# Patient Record
Sex: Female | Born: 1940
Health system: Southern US, Community
[De-identification: ages and names within clinical notes are randomized; demographics above are authoritative.]

## PROBLEM LIST (undated history)

## (undated) DIAGNOSIS — Z9889 Other specified postprocedural states: Secondary | ICD-10-CM

## (undated) DIAGNOSIS — R112 Nausea with vomiting, unspecified: Secondary | ICD-10-CM

## (undated) DIAGNOSIS — T8859XA Other complications of anesthesia, initial encounter: Secondary | ICD-10-CM

## (undated) DIAGNOSIS — C439 Malignant melanoma of skin, unspecified: Secondary | ICD-10-CM

## (undated) DIAGNOSIS — M8580 Other specified disorders of bone density and structure, unspecified site: Secondary | ICD-10-CM

## (undated) DIAGNOSIS — C689 Malignant neoplasm of urinary organ, unspecified: Secondary | ICD-10-CM

## (undated) DIAGNOSIS — T4145XA Adverse effect of unspecified anesthetic, initial encounter: Secondary | ICD-10-CM

## (undated) DIAGNOSIS — M1990 Unspecified osteoarthritis, unspecified site: Secondary | ICD-10-CM

## (undated) DIAGNOSIS — I10 Essential (primary) hypertension: Secondary | ICD-10-CM

## (undated) DIAGNOSIS — R000 Tachycardia, unspecified: Secondary | ICD-10-CM

## (undated) DIAGNOSIS — Z22322 Carrier or suspected carrier of Methicillin resistant Staphylococcus aureus: Secondary | ICD-10-CM

## (undated) HISTORY — DX: Tachycardia, unspecified: R00.0

## (undated) HISTORY — DX: Essential (primary) hypertension: I10

## (undated) HISTORY — DX: Malignant neoplasm of urinary organ, unspecified: C68.9

## (undated) HISTORY — PX: OTHER SURGICAL HISTORY: SHX169

## (undated) HISTORY — DX: Carrier or suspected carrier of methicillin resistant Staphylococcus aureus: Z22.322

## (undated) HISTORY — PX: TONSILLECTOMY: SHX5217

## (undated) HISTORY — DX: Other specified disorders of bone density and structure, unspecified site: M85.80

## (undated) HISTORY — PX: BACK SURGERY: SHX140

## (undated) HISTORY — DX: Unspecified osteoarthritis, unspecified site: M19.90

## (undated) HISTORY — PX: EYE SURGERY: SHX253

## (undated) HISTORY — DX: Malignant melanoma of skin, unspecified: C43.9

---
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
## 1975-11-17 HISTORY — PX: MELANOMA EXCISION: SHX5266

## 1985-11-16 HISTORY — PX: ABDOMINAL HYSTERECTOMY: SHX81

## 1997-11-16 HISTORY — PX: SKIN CANCER EXCISION: SHX779

## 1998-04-23 ENCOUNTER — Ambulatory Visit (HOSPITAL_COMMUNITY): Admission: RE | Admit: 1998-04-23 | Discharge: 1998-04-23 | Payer: Self-pay | Admitting: *Deleted

## 1999-06-03 ENCOUNTER — Other Ambulatory Visit: Admission: RE | Admit: 1999-06-03 | Discharge: 1999-06-03 | Payer: Self-pay | Admitting: *Deleted

## 2000-08-16 ENCOUNTER — Other Ambulatory Visit: Admission: RE | Admit: 2000-08-16 | Discharge: 2000-08-16 | Payer: Self-pay | Admitting: *Deleted

## 2001-08-04 ENCOUNTER — Other Ambulatory Visit: Admission: RE | Admit: 2001-08-04 | Discharge: 2001-08-04 | Payer: Self-pay | Admitting: *Deleted

## 2001-08-29 ENCOUNTER — Encounter: Payer: Self-pay | Admitting: *Deleted

## 2001-08-29 ENCOUNTER — Encounter: Admission: RE | Admit: 2001-08-29 | Discharge: 2001-08-29 | Payer: Self-pay | Admitting: *Deleted

## 2002-09-08 ENCOUNTER — Encounter: Admission: RE | Admit: 2002-09-08 | Discharge: 2002-09-08 | Payer: Self-pay | Admitting: Obstetrics and Gynecology

## 2002-09-08 ENCOUNTER — Encounter: Payer: Self-pay | Admitting: Obstetrics and Gynecology

## 2002-11-16 HISTORY — PX: CATARACT EXTRACTION: SUR2

## 2003-09-11 ENCOUNTER — Other Ambulatory Visit: Admission: RE | Admit: 2003-09-11 | Discharge: 2003-09-11 | Payer: Self-pay | Admitting: Family Medicine

## 2004-02-07 ENCOUNTER — Encounter: Admission: RE | Admit: 2004-02-07 | Discharge: 2004-02-07 | Payer: Self-pay | Admitting: Family Medicine

## 2004-07-25 ENCOUNTER — Encounter: Admission: RE | Admit: 2004-07-25 | Discharge: 2004-07-25 | Payer: Self-pay | Admitting: Family Medicine

## 2004-10-03 ENCOUNTER — Other Ambulatory Visit: Admission: RE | Admit: 2004-10-03 | Discharge: 2004-10-03 | Payer: Self-pay | Admitting: Family Medicine

## 2004-10-03 ENCOUNTER — Ambulatory Visit: Payer: Self-pay | Admitting: Family Medicine

## 2004-12-02 ENCOUNTER — Ambulatory Visit: Payer: Self-pay | Admitting: Internal Medicine

## 2005-05-21 ENCOUNTER — Ambulatory Visit: Payer: Self-pay | Admitting: Family Medicine

## 2005-07-23 ENCOUNTER — Encounter: Admission: RE | Admit: 2005-07-23 | Discharge: 2005-07-23 | Payer: Self-pay | Admitting: Family Medicine

## 2005-09-02 ENCOUNTER — Other Ambulatory Visit: Admission: RE | Admit: 2005-09-02 | Discharge: 2005-09-02 | Payer: Self-pay | Admitting: Family Medicine

## 2005-09-02 ENCOUNTER — Ambulatory Visit: Payer: Self-pay | Admitting: Family Medicine

## 2005-10-23 ENCOUNTER — Ambulatory Visit: Payer: Self-pay | Admitting: Family Medicine

## 2006-10-12 ENCOUNTER — Ambulatory Visit: Payer: Self-pay | Admitting: Family Medicine

## 2006-11-18 ENCOUNTER — Other Ambulatory Visit: Admission: RE | Admit: 2006-11-18 | Discharge: 2006-11-18 | Payer: Self-pay | Admitting: Family Medicine

## 2006-11-18 ENCOUNTER — Ambulatory Visit: Payer: Self-pay | Admitting: Family Medicine

## 2006-11-18 ENCOUNTER — Encounter: Payer: Self-pay | Admitting: Family Medicine

## 2006-11-18 LAB — CONVERTED CEMR LAB
AST: 20 units/L (ref 0–37)
Albumin: 3.5 g/dL (ref 3.5–5.2)
BUN: 22 mg/dL (ref 6–23)
CO2: 30 meq/L (ref 19–32)
Chol/HDL Ratio, serum: 4.1
Cholesterol: 178 mg/dL (ref 0–200)
Creatinine, Ser: 1.2 mg/dL (ref 0.4–1.2)
Glomerular Filtration Rate, Af Am: 58 mL/min/{1.73_m2}
HCT: 43.8 % (ref 36.0–46.0)
HDL: 43.5 mg/dL (ref >39.0)
Hemoglobin: 14.7 g/dL (ref 12.0–15.0)
Monocytes Absolute: 0.4 10*3/uL (ref 0.2–0.7)
Neutrophils Relative %: 69.1 % (ref 43.0–77.0)
Platelets: 226 10*3/uL (ref 150–400)
Potassium: 4.4 meq/L (ref 3.5–5.1)
RBC: 4.74 M/uL (ref 3.87–5.11)
TSH: 0.71 microintl units/mL (ref 0.35–5.50)
Total Bilirubin: 1.1 mg/dL (ref 0.3–1.2)
WBC: 6.8 10*3/uL (ref 4.5–10.5)

## 2006-11-20 ENCOUNTER — Encounter: Payer: Self-pay | Admitting: Family Medicine

## 2006-11-20 LAB — CONVERTED CEMR LAB

## 2006-12-27 ENCOUNTER — Ambulatory Visit: Payer: Self-pay | Admitting: Family Medicine

## 2007-04-08 ENCOUNTER — Encounter: Admission: RE | Admit: 2007-04-08 | Discharge: 2007-04-08 | Payer: Self-pay | Admitting: Family Medicine

## 2007-05-19 ENCOUNTER — Ambulatory Visit: Payer: Self-pay | Admitting: Internal Medicine

## 2007-05-19 ENCOUNTER — Ambulatory Visit: Payer: Self-pay | Admitting: Family Medicine

## 2007-05-19 DIAGNOSIS — J019 Acute sinusitis, unspecified: Secondary | ICD-10-CM | POA: Insufficient documentation

## 2007-05-19 DIAGNOSIS — L03019 Cellulitis of unspecified finger: Secondary | ICD-10-CM | POA: Insufficient documentation

## 2007-06-02 ENCOUNTER — Ambulatory Visit: Payer: Self-pay | Admitting: Internal Medicine

## 2007-06-02 ENCOUNTER — Encounter: Payer: Self-pay | Admitting: Family Medicine

## 2007-06-10 ENCOUNTER — Encounter: Payer: Self-pay | Admitting: Family Medicine

## 2007-07-19 ENCOUNTER — Telehealth (INDEPENDENT_AMBULATORY_CARE_PROVIDER_SITE_OTHER): Payer: Self-pay | Admitting: *Deleted

## 2007-07-20 ENCOUNTER — Ambulatory Visit: Payer: Self-pay | Admitting: Family Medicine

## 2007-07-20 DIAGNOSIS — L0390 Cellulitis, unspecified: Secondary | ICD-10-CM

## 2007-07-20 DIAGNOSIS — L0291 Cutaneous abscess, unspecified: Secondary | ICD-10-CM | POA: Insufficient documentation

## 2007-07-28 ENCOUNTER — Ambulatory Visit: Payer: Self-pay | Admitting: Family Medicine

## 2007-07-28 DIAGNOSIS — L258 Unspecified contact dermatitis due to other agents: Secondary | ICD-10-CM | POA: Insufficient documentation

## 2007-08-04 ENCOUNTER — Ambulatory Visit: Payer: Self-pay | Admitting: Family Medicine

## 2007-09-23 ENCOUNTER — Ambulatory Visit: Payer: Self-pay | Admitting: Family Medicine

## 2007-09-23 DIAGNOSIS — J069 Acute upper respiratory infection, unspecified: Secondary | ICD-10-CM | POA: Insufficient documentation

## 2007-09-24 ENCOUNTER — Encounter: Payer: Self-pay | Admitting: Family Medicine

## 2007-09-26 ENCOUNTER — Telehealth (INDEPENDENT_AMBULATORY_CARE_PROVIDER_SITE_OTHER): Payer: Self-pay | Admitting: *Deleted

## 2007-10-06 ENCOUNTER — Ambulatory Visit: Payer: Self-pay | Admitting: Family Medicine

## 2007-10-06 DIAGNOSIS — B957 Other staphylococcus as the cause of diseases classified elsewhere: Secondary | ICD-10-CM | POA: Insufficient documentation

## 2007-10-31 ENCOUNTER — Telehealth (INDEPENDENT_AMBULATORY_CARE_PROVIDER_SITE_OTHER): Payer: Self-pay | Admitting: *Deleted

## 2007-11-21 ENCOUNTER — Telehealth (INDEPENDENT_AMBULATORY_CARE_PROVIDER_SITE_OTHER): Payer: Self-pay | Admitting: *Deleted

## 2007-12-12 ENCOUNTER — Telehealth (INDEPENDENT_AMBULATORY_CARE_PROVIDER_SITE_OTHER): Payer: Self-pay | Admitting: *Deleted

## 2007-12-19 ENCOUNTER — Telehealth (INDEPENDENT_AMBULATORY_CARE_PROVIDER_SITE_OTHER): Payer: Self-pay | Admitting: *Deleted

## 2007-12-23 ENCOUNTER — Telehealth (INDEPENDENT_AMBULATORY_CARE_PROVIDER_SITE_OTHER): Payer: Self-pay | Admitting: *Deleted

## 2008-01-09 ENCOUNTER — Telehealth (INDEPENDENT_AMBULATORY_CARE_PROVIDER_SITE_OTHER): Payer: Self-pay | Admitting: *Deleted

## 2008-03-02 ENCOUNTER — Encounter: Payer: Self-pay | Admitting: Family Medicine

## 2008-03-02 ENCOUNTER — Ambulatory Visit: Payer: Self-pay | Admitting: Family Medicine

## 2008-03-02 ENCOUNTER — Other Ambulatory Visit: Admission: RE | Admit: 2008-03-02 | Discharge: 2008-03-02 | Payer: Self-pay | Admitting: Family Medicine

## 2008-03-02 DIAGNOSIS — I10 Essential (primary) hypertension: Secondary | ICD-10-CM | POA: Insufficient documentation

## 2008-03-02 DIAGNOSIS — C679 Malignant neoplasm of bladder, unspecified: Secondary | ICD-10-CM | POA: Insufficient documentation

## 2008-03-02 DIAGNOSIS — E039 Hypothyroidism, unspecified: Secondary | ICD-10-CM

## 2008-03-02 DIAGNOSIS — N951 Menopausal and female climacteric states: Secondary | ICD-10-CM | POA: Insufficient documentation

## 2008-03-02 DIAGNOSIS — Z85820 Personal history of malignant melanoma of skin: Secondary | ICD-10-CM | POA: Insufficient documentation

## 2008-03-02 DIAGNOSIS — H9190 Unspecified hearing loss, unspecified ear: Secondary | ICD-10-CM | POA: Insufficient documentation

## 2008-03-02 HISTORY — DX: Hypothyroidism, unspecified: E03.9

## 2008-03-02 HISTORY — DX: Unspecified hearing loss, unspecified ear: H91.90

## 2008-03-05 ENCOUNTER — Encounter (INDEPENDENT_AMBULATORY_CARE_PROVIDER_SITE_OTHER): Payer: Self-pay | Admitting: *Deleted

## 2008-03-06 ENCOUNTER — Encounter (INDEPENDENT_AMBULATORY_CARE_PROVIDER_SITE_OTHER): Payer: Self-pay | Admitting: *Deleted

## 2008-03-07 ENCOUNTER — Encounter (INDEPENDENT_AMBULATORY_CARE_PROVIDER_SITE_OTHER): Payer: Self-pay | Admitting: *Deleted

## 2008-03-27 ENCOUNTER — Encounter: Payer: Self-pay | Admitting: Family Medicine

## 2008-05-24 ENCOUNTER — Telehealth (INDEPENDENT_AMBULATORY_CARE_PROVIDER_SITE_OTHER): Payer: Self-pay | Admitting: *Deleted

## 2008-06-20 ENCOUNTER — Ambulatory Visit: Payer: Self-pay | Admitting: Family Medicine

## 2008-06-20 DIAGNOSIS — F438 Other reactions to severe stress: Secondary | ICD-10-CM

## 2008-06-20 DIAGNOSIS — F4389 Other reactions to severe stress: Secondary | ICD-10-CM | POA: Insufficient documentation

## 2008-06-28 ENCOUNTER — Encounter: Payer: Self-pay | Admitting: Family Medicine

## 2008-09-12 ENCOUNTER — Ambulatory Visit: Payer: Self-pay | Admitting: Family Medicine

## 2008-10-04 ENCOUNTER — Encounter: Admission: RE | Admit: 2008-10-04 | Discharge: 2008-10-04 | Payer: Self-pay | Admitting: Family Medicine

## 2008-10-07 ENCOUNTER — Encounter (INDEPENDENT_AMBULATORY_CARE_PROVIDER_SITE_OTHER): Payer: Self-pay | Admitting: *Deleted

## 2008-10-23 ENCOUNTER — Telehealth: Payer: Self-pay | Admitting: Family Medicine

## 2008-11-07 ENCOUNTER — Ambulatory Visit: Payer: Self-pay | Admitting: Internal Medicine

## 2008-11-27 ENCOUNTER — Telehealth (INDEPENDENT_AMBULATORY_CARE_PROVIDER_SITE_OTHER): Payer: Self-pay | Admitting: *Deleted

## 2009-02-18 ENCOUNTER — Telehealth (INDEPENDENT_AMBULATORY_CARE_PROVIDER_SITE_OTHER): Payer: Self-pay | Admitting: *Deleted

## 2009-03-12 ENCOUNTER — Telehealth (INDEPENDENT_AMBULATORY_CARE_PROVIDER_SITE_OTHER): Payer: Self-pay | Admitting: *Deleted

## 2009-03-12 ENCOUNTER — Ambulatory Visit: Payer: Self-pay | Admitting: Family Medicine

## 2009-03-12 ENCOUNTER — Other Ambulatory Visit: Admission: RE | Admit: 2009-03-12 | Discharge: 2009-03-12 | Payer: Self-pay | Admitting: Family Medicine

## 2009-03-12 ENCOUNTER — Encounter: Payer: Self-pay | Admitting: Family Medicine

## 2009-03-12 ENCOUNTER — Encounter (INDEPENDENT_AMBULATORY_CARE_PROVIDER_SITE_OTHER): Payer: Self-pay | Admitting: *Deleted

## 2009-03-13 ENCOUNTER — Encounter: Payer: Self-pay | Admitting: Family Medicine

## 2009-03-18 ENCOUNTER — Encounter (INDEPENDENT_AMBULATORY_CARE_PROVIDER_SITE_OTHER): Payer: Self-pay | Admitting: *Deleted

## 2009-03-27 ENCOUNTER — Telehealth: Payer: Self-pay | Admitting: Family Medicine

## 2009-05-07 ENCOUNTER — Telehealth (INDEPENDENT_AMBULATORY_CARE_PROVIDER_SITE_OTHER): Payer: Self-pay | Admitting: *Deleted

## 2009-06-06 ENCOUNTER — Ambulatory Visit (HOSPITAL_COMMUNITY): Admission: RE | Admit: 2009-06-06 | Discharge: 2009-06-06 | Payer: Self-pay | Admitting: Plastic Surgery

## 2009-06-24 ENCOUNTER — Encounter (INDEPENDENT_AMBULATORY_CARE_PROVIDER_SITE_OTHER): Payer: Self-pay | Admitting: Plastic Surgery

## 2009-06-24 ENCOUNTER — Ambulatory Visit (HOSPITAL_BASED_OUTPATIENT_CLINIC_OR_DEPARTMENT_OTHER): Admission: RE | Admit: 2009-06-24 | Discharge: 2009-06-24 | Payer: Self-pay | Admitting: Plastic Surgery

## 2009-06-24 HISTORY — PX: MELANOMA EXCISION: SHX5266

## 2009-09-05 ENCOUNTER — Telehealth (INDEPENDENT_AMBULATORY_CARE_PROVIDER_SITE_OTHER): Payer: Self-pay | Admitting: *Deleted

## 2009-10-01 ENCOUNTER — Ambulatory Visit: Payer: Self-pay | Admitting: Family Medicine

## 2009-11-04 ENCOUNTER — Telehealth: Payer: Self-pay | Admitting: Family Medicine

## 2010-02-11 ENCOUNTER — Encounter: Admission: RE | Admit: 2010-02-11 | Discharge: 2010-02-11 | Payer: Self-pay | Admitting: Family Medicine

## 2010-02-11 LAB — HM MAMMOGRAPHY

## 2010-03-13 ENCOUNTER — Ambulatory Visit: Payer: Self-pay | Admitting: Family Medicine

## 2010-03-13 ENCOUNTER — Encounter (INDEPENDENT_AMBULATORY_CARE_PROVIDER_SITE_OTHER): Payer: Self-pay | Admitting: *Deleted

## 2010-03-13 DIAGNOSIS — C4370 Malignant melanoma of unspecified lower limb, including hip: Secondary | ICD-10-CM | POA: Insufficient documentation

## 2010-04-01 ENCOUNTER — Ambulatory Visit: Payer: Self-pay | Admitting: Family Medicine

## 2010-04-02 LAB — CONVERTED CEMR LAB: Fecal Occult Bld: NEGATIVE

## 2010-10-21 ENCOUNTER — Ambulatory Visit: Payer: Self-pay | Admitting: Family Medicine

## 2010-11-27 ENCOUNTER — Ambulatory Visit
Admission: RE | Admit: 2010-11-27 | Discharge: 2010-11-27 | Payer: Self-pay | Source: Home / Self Care | Attending: Family Medicine | Admitting: Family Medicine

## 2010-12-12 ENCOUNTER — Ambulatory Visit
Admission: RE | Admit: 2010-12-12 | Discharge: 2010-12-12 | Payer: Self-pay | Source: Home / Self Care | Attending: Internal Medicine | Admitting: Internal Medicine

## 2010-12-14 LAB — CONVERTED CEMR LAB
ALT: 20 units/L (ref 0–35)
ALT: 20 units/L (ref 0–35)
AST: 22 units/L (ref 0–37)
AST: 23 units/L (ref 0–37)
Albumin: 4.1 g/dL (ref 3.5–5.2)
Albumin: 4.2 g/dL (ref 3.5–5.2)
Alkaline Phosphatase: 65 units/L (ref 39–117)
BUN: 18 mg/dL (ref 6–23)
BUN: 20 mg/dL (ref 6–23)
BUN: 24 mg/dL — ABNORMAL HIGH (ref 6–23)
Basophils Absolute: 0 10*3/uL (ref 0.0–0.1)
Basophils Relative: 0.2 % (ref 0.0–3.0)
Basophils Relative: 0.3 % (ref 0.0–1.0)
Bilirubin Urine: NEGATIVE
Bilirubin Urine: NEGATIVE
Bilirubin, Direct: 0.1 mg/dL (ref 0.0–0.3)
Bilirubin, Direct: 0.1 mg/dL (ref 0.0–0.3)
CO2: 32 meq/L (ref 19–32)
CO2: 32 meq/L (ref 19–32)
Calcium: 9.3 mg/dL (ref 8.4–10.5)
Chloride: 104 meq/L (ref 96–112)
Chloride: 105 meq/L (ref 96–112)
Chloride: 106 meq/L (ref 96–112)
Cholesterol: 170 mg/dL (ref 0–200)
Cholesterol: 179 mg/dL (ref 0–200)
Eosinophils Absolute: 0.1 10*3/uL (ref 0.0–0.7)
Eosinophils Relative: 2.2 % (ref 0.0–5.0)
Eosinophils Relative: 2.2 % (ref 0.0–5.0)
HCT: 41.7 % (ref 36.0–46.0)
HCT: 42.4 % (ref 36.0–46.0)
HCT: 42.6 % (ref 36.0–46.0)
HDL: 37.8 mg/dL — ABNORMAL LOW (ref >39.0)
HDL: 50.6 mg/dL (ref >39.00)
Ketones, urine, test strip: NEGATIVE
LDL Cholesterol: 113 mg/dL — ABNORMAL HIGH (ref 0–99)
LDL Cholesterol: 99 mg/dL (ref 0–99)
LDL Cholesterol: 99 mg/dL (ref 0–99)
Lymphocytes Relative: 22 % (ref 12.0–46.0)
Lymphocytes Relative: 25.9 % (ref 12.0–46.0)
Lymphs Abs: 1.9 10*3/uL (ref 0.7–4.0)
MCHC: 33.6 g/dL (ref 30.0–36.0)
MCHC: 34.3 g/dL (ref 30.0–36.0)
MCHC: 34.6 g/dL (ref 30.0–36.0)
MCV: 91.6 fL (ref 78.0–100.0)
Monocytes Absolute: 0.4 10*3/uL (ref 0.1–1.0)
Monocytes Absolute: 0.4 10*3/uL (ref 0.1–1.0)
Monocytes Absolute: 0.5 10*3/uL (ref 0.1–1.0)
Neutro Abs: 3.8 10*3/uL (ref 1.4–7.7)
Neutro Abs: 4.5 10*3/uL (ref 1.4–7.7)
Neutrophils Relative %: 69.8 % (ref 43.0–77.0)
Platelets: 199 10*3/uL (ref 150.0–400.0)
Platelets: 227 10*3/uL (ref 150–400)
Potassium: 3.7 meq/L (ref 3.5–5.1)
Potassium: 3.9 meq/L (ref 3.5–5.1)
Potassium: 4.5 meq/L (ref 3.5–5.1)
RDW: 12.4 % (ref 11.5–14.6)
RDW: 13.3 % (ref 11.5–14.6)
Sodium: 141 meq/L (ref 135–145)
Sodium: 144 meq/L (ref 135–145)
Specific Gravity, Urine: 1.01
TSH: 0.87 microintl units/mL (ref 0.35–5.50)
Total Bilirubin: 1.1 mg/dL (ref 0.3–1.2)
Total Bilirubin: 1.3 mg/dL — ABNORMAL HIGH (ref 0.3–1.2)
Total CHOL/HDL Ratio: 3
Total Protein: 6.7 g/dL (ref 6.0–8.3)
Total Protein: 6.7 g/dL (ref 6.0–8.3)
Triglycerides: 138 mg/dL (ref 0.0–149.0)
Triglycerides: 171 mg/dL — ABNORMAL HIGH (ref 0.0–149.0)
Urobilinogen, UA: NEGATIVE
VLDL: 27.6 mg/dL (ref 0.0–40.0)
WBC Urine, dipstick: NEGATIVE
WBC Urine, dipstick: NEGATIVE
WBC: 6.7 10*3/uL (ref 4.5–10.5)
WBC: 7 10*3/uL (ref 4.5–10.5)
pH: 5
pH: 6

## 2010-12-18 NOTE — Assessment & Plan Note (Signed)
Summary: CPX W/PAP,FASTING,MEDICARE & GEHA/RH.....   Vital Signs:  Patient profile:   70 year old female Height:      64.5 inches Weight:      175 pounds BMI:     29.68 Pulse rate:   76 / minute Pulse rhythm:   regular BP sitting:   126 / 82  (left arm) Cuff size:   large  Vitals Entered By: Army Fossa CMA (March 13, 2010 8:09 AM) CC: Pt here for CPX, would like to discuss PAP. I informed her that Medicare normally pays for a pap every other year and she had one last year.   History of Present Illness: Pt here for cpe and labs.  No complaints.    Preventive Screening-Counseling & Management  Alcohol-Tobacco     Alcohol drinks/day: <1     Alcohol type: wine     Smoking Status: quit > 6 months     Packs/Day: 1982     Year Quit: 1982     Pack years: < 1 ppd for 15 years     Passive Smoke Exposure: no  Caffeine-Diet-Exercise     Caffeine use/day: 4     Caffeine Counseling: not indicated; caffeine use is not excessive or problematic     Does Patient Exercise: yes     Type of exercise: dancing, bike     Exercise (avg: min/session): >60     Times/week: 3     Exercise Counseling: not indicated; exercise is adequate  Hep-HIV-STD-Contraception     HIV Risk: no     Dental Visit-last 6 months yes     Dental Care Counseling: not indicated; dental care within six months     SBE monthly: yes     SBE Education/Counseling: not indicated; SBE done regularly     Sun Exposure-Excessive: no  Safety-Violence-Falls     Seat Belt Use: yes     Firearms in the Home: no firearms in the home     Smoke Detectors: yes      Sexual History:  widow.    Current Medications (verified): 1)  Propranolol Hcl 10 Mg Tabs (Propranolol Hcl) .... Take 1 Tablet Twice A Day 2)  Spironolactone 25 Mg Tabs (Spironolactone) .... Take 1/2 Tablet By Mouth Once A Day 3)  Provera 5 Mg Tabs (Medroxyprogesterone Acetate) .... Take 1 Tablet Once A Day Brand Name Necessary 4)  Synthroid 75 Mcg  Tabs  (Levothyroxine Sodium) .Marland Kitchen.. 1 Once Daily 5)  Zantac 75   Tabs (Ranitidine Hcl Tabs) .Marland Kitchen.. 1 Once Daily 6)  Mobic 15 Mg  Tabs (Meloxicam) .... 1/2 -1 By Mouth Once Daily 7)  Dynacirc Cr 5 Mg  Tb24 (Isradipine) .... Take One Tablet Daily 8)  Estropiate 0.625mg  .... Take 1 By Mouth Once Daily 9)  Veramyst 27.5 Mcg/spray Susp (Fluticasone Furoate) .... 2 Sprays Each Nostril Once Daily  Allergies: 1)  ! * Antihistamines 2)  ! * Decongestants 3)  ! Codeine  Past History:  Past Medical History: Last updated: 03/02/2008 +MRSA HTN arthritis benigh rapid heart beat transitional cell gall bladder  Family History: Last updated: 03/02/2008 Family History Diabetes 1st degree relative- M Family History of Stroke F 1st degree relative <60-------69yo  hemorrhagic  M-- alz dementia  Social History: Last updated: 03/13/2010 Occupation: accounting 4 days a week Former Smoker Alcohol use-yes Drug use-no Regular exercise-yes Widow/Widower  Risk Factors: Alcohol Use: <1 (03/13/2010) Caffeine Use: 4 (03/13/2010) Exercise: yes (03/13/2010)  Risk Factors: Smoking Status: quit > 6 months (03/13/2010)  Packs/Day: 1982 (03/13/2010) Passive Smoke Exposure: no (03/13/2010)  Past Surgical History: tonsillectomy partial hysterectomy-1987 melanoma left leg-1977 skin ca bladder transitional cell 1999 cataracts-2004 teeth implants osteopenia-07-2005 melanoma--r foot 06/24/2009  Family History: Reviewed history from 03/02/2008 and no changes required. Family History Diabetes 1st degree relative- M Family History of Stroke F 1st degree relative <60-------70yo  hemorrhagic  M-- alz dementia  Social History: Reviewed history from 03/02/2008 and no changes required. Occupation: accounting 4 days a week Former Smoker Alcohol use-yes Drug use-no Regular exercise-yes Widow/Widower Smoking Status:  quit > 6 months  Review of Systems      See HPI General:  Denies chills, fatigue, fever, loss  of appetite, malaise, sleep disorder, sweats, weakness, and weight loss. Eyes:  Denies blurring, discharge, double vision, eye irritation, eye pain, halos, itching, light sensitivity, red eye, vision loss-1 eye, and vision loss-both eyes; optho- q1y. ENT:  Denies decreased hearing, difficulty swallowing, ear discharge, earache, hoarseness, nasal congestion, nosebleeds, postnasal drainage, ringing in ears, sinus pressure, and sore throat; HOH--  b/l hearing aids. CV:  Denies bluish discoloration of lips or nails, chest pain or discomfort, difficulty breathing at night, difficulty breathing while lying down, fainting, fatigue, leg cramps with exertion, lightheadness, near fainting, palpitations, shortness of breath with exertion, swelling of feet, swelling of hands, and weight gain. Resp:  Denies chest discomfort, chest pain with inspiration, cough, coughing up blood, excessive snoring, hypersomnolence, morning headaches, pleuritic, shortness of breath, sputum productive, and wheezing. GI:  Denies abdominal pain, bloody stools, change in bowel habits, constipation, dark tarry stools, diarrhea, excessive appetite, gas, hemorrhoids, indigestion, loss of appetite, nausea, vomiting, vomiting blood, and yellowish skin color. GU:  Denies abnormal vaginal bleeding, decreased libido, discharge, dysuria, genital sores, hematuria, incontinence, nocturia, urinary frequency, and urinary hesitancy. MS:  Denies joint pain, joint redness, joint swelling, loss of strength, low back pain, mid back pain, muscle aches, muscle , cramps, muscle weakness, stiffness, and thoracic pain. Derm:  Denies changes in color of skin, changes in nail beds, dryness, excessive perspiration, flushing, hair loss, insect bite(s), itching, lesion(s), poor wound healing, and rash. Neuro:  Denies brief paralysis, difficulty with concentration, disturbances in coordination, falling down, headaches, inability to speak, memory loss, numbness, poor  balance, seizures, sensation of room spinning, tingling, tremors, visual disturbances, and weakness. Psych:  Denies alternate hallucination ( auditory/visual), anxiety, depression, easily angered, easily tearful, irritability, mental problems, panic attacks, sense of great danger, suicidal thoughts/plans, thoughts of violence, unusual visions or sounds, and thoughts /plans of harming others. Endo:  Denies cold intolerance, excessive hunger, excessive thirst, excessive urination, heat intolerance, polyuria, and weight change. Heme:  Denies abnormal bruising, bleeding, enlarge lymph nodes, fevers, pallor, and skin discoloration. Allergy:  Denies hives or rash, itching eyes, persistent infections, seasonal allergies, and sneezing.  Physical Exam  General:  Well-developed,well-nourished,in no acute distress; alert,appropriate and cooperative throughout examination Head:  Normocephalic and atraumatic without obvious abnormalities. No apparent alopecia or balding. Eyes:  vision grossly intact, pupils equal, pupils round, pupils reactive to light, and no injection.   Ears:  External ear exam shows no significant lesions or deformities.  Otoscopic examination reveals clear canals, tympanic membranes are intact bilaterally without bulging, retraction, inflammation or discharge. Hearing is grossly normal bilaterally. Nose:  External nasal examination shows no deformity or inflammation. Nasal mucosa are pink and moist without lesions or exudates. Mouth:  Oral mucosa and oropharynx without lesions or exudates.  Teeth in good repair. Neck:  No deformities, masses, or tenderness noted.no carotid  bruits.   Chest Wall:  No deformities, masses, or tenderness noted. Breasts:  No mass, nodules, thickening, tenderness, bulging, retraction, inflamation, nipple discharge or skin changes noted.   Lungs:  Normal respiratory effort, chest expands symmetrically. Lungs are clear to auscultation, no crackles or  wheezes. Heart:  normal rate and no murmur.   Abdomen:  Bowel sounds positive,abdomen soft and non-tender without masses, organomegaly or hernias noted. Msk:  normal ROM, no joint tenderness, no joint swelling, no joint warmth, no redness over joints, no joint deformities, no joint instability, and no crepitation.   Pulses:  R posterior tibial normal, R dorsalis pedis normal, R carotid normal, L posterior tibial normal, L dorsalis pedis normal, and L carotid normal.   Extremities:  No clubbing, cyanosis, edema, or deformity noted with normal full range of motion of all joints.   Neurologic:  No cranial nerve deficits noted. Station and gait are normal. Plantar reflexes are down-going bilaterally. DTRs are symmetrical throughout. Sensory, motor and coordinative functions appear intact. Skin:  Intact without suspicious lesions or rashes Cervical Nodes:  No lymphadenopathy noted Axillary Nodes:  No palpable lymphadenopathy Psych:  Cognition and judgment appear intact. Alert and cooperative with normal attention span and concentration. No apparent delusions, illusions, hallucinations   Impression & Recommendations:  Problem # 1:  POSTMENOPAUSAL STATUS (ICD-627.2)  The following medications were removed from the medication list:    Premarin 0.3 Mg Tabs (Estrogens conjugated) .Marland Kitchen... Take 1 by mouth once daily 11refills  Orders: Venipuncture (04540) TLB-Lipid Panel (80061-LIPID) TLB-BMP (Basic Metabolic Panel-BMET) (80048-METABOL) TLB-CBC Platelet - w/Differential (85025-CBCD) TLB-Hepatic/Liver Function Pnl (80076-HEPATIC) TLB-TSH (Thyroid Stimulating Hormone) (84443-TSH) TLB-T4 (Thyrox), Free [phone]) TLB-T3, Free (Triiodothyronine) (84481-T3FREE) T-Vitamin D (25-Hydroxy) (29562-13086) EKG w/ Interpretation (93000)  Discussed treatment options.   Problem # 2:  HYPOTHYROIDISM (ICD-244.9)  Her updated medication list for this problem includes:    Synthroid 75 Mcg Tabs (Levothyroxine  sodium) .Marland Kitchen... 1 once daily  Orders: Venipuncture (57846) TLB-Lipid Panel (80061-LIPID) TLB-BMP (Basic Metabolic Panel-BMET) (80048-METABOL) TLB-CBC Platelet - w/Differential (85025-CBCD) TLB-Hepatic/Liver Function Pnl (80076-HEPATIC) TLB-TSH (Thyroid Stimulating Hormone) (84443-TSH) TLB-T4 (Thyrox), Free [phone]) TLB-T3, Free (Triiodothyronine) (84481-T3FREE) T-Vitamin D (25-Hydroxy) (32440-10272) EKG w/ Interpretation (93000)  Labs Reviewed: TSH: 0.79 (03/12/2009)    Chol: 170 (03/12/2009)   HDL: 37.10 (03/12/2009)   LDL: 99 (03/12/2009)   TG: 171.0 (03/12/2009)  Problem # 3:  HYPERTENSION (ICD-401.9)  Her updated medication list for this problem includes:    Propranolol Hcl 10 Mg Tabs (Propranolol hcl) .Marland Kitchen... Take 1 tablet twice a day    Spironolactone 25 Mg Tabs (Spironolactone) .Marland Kitchen... Take 1/2 tablet by mouth once a day    Dynacirc Cr 5 Mg Tb24 (Isradipine) .Marland Kitchen... Take one tablet daily  Orders: Venipuncture (53664) TLB-Lipid Panel (80061-LIPID) TLB-BMP (Basic Metabolic Panel-BMET) (80048-METABOL) TLB-CBC Platelet - w/Differential (85025-CBCD) TLB-Hepatic/Liver Function Pnl (80076-HEPATIC) TLB-TSH (Thyroid Stimulating Hormone) (84443-TSH) TLB-T4 (Thyrox), Free [phone] TLB-T3, Free (Triiodothyronine) (84481-T3FREE) T-Vitamin D (25-Hydroxy) (56387-56433) EKG w/ Interpretation (93000)  BP today: 126/82 Prior BP: 124/84 (10/01/2009)  Labs Reviewed: K+: 4.5 (03/12/2009) Creat: : 1.0 (03/12/2009)   Chol: 170 (03/12/2009)   HDL: 37.10 (03/12/2009)   LDL: 99 (03/12/2009)   TG: 171.0 (03/12/2009)  Problem # 4:  FAMILY HISTORY DIABETES 1ST DEGREE RELATIVE (ICD-V18.0)  Orders: Venipuncture (29518) TLB-Lipid Panel (80061-LIPID) TLB-BMP (Basic Metabolic Panel-BMET) (80048-METABOL) TLB-CBC Platelet - w/Differential (85025-CBCD) TLB-Hepatic/Liver Function Pnl (80076-HEPATIC) TLB-TSH (Thyroid Stimulating Hormone) (84443-TSH) TLB-T4 (Thyrox), Free [phone] TLB-T3,  Free (Triiodothyronine) (84481-T3FREE) T-Vitamin D (25-Hydroxy) (16010-93235) EKG w/ Interpretation (93000)  Complete Medication List: 1)  Propranolol Hcl 10 Mg Tabs (Propranolol hcl) .... Take 1 tablet twice a day 2)  Spironolactone 25 Mg Tabs (Spironolactone) .... Take 1/2 tablet by mouth once a day 3)  Provera 5 Mg Tabs (Medroxyprogesterone acetate) .... Take 1 tablet once a day brand name necessary 4)  Synthroid 75 Mcg Tabs (Levothyroxine sodium) .Marland Kitchen.. 1 once daily 5)  Zantac 75 Tabs (Ranitidine hcl tabs) .Marland Kitchen.. 1 once daily 6)  Mobic 15 Mg Tabs (Meloxicam) .... 1/2 -1 by mouth once daily 7)  Dynacirc Cr 5 Mg Tb24 (Isradipine) .... Take one tablet daily 8)  Estropiate 0.625mg   .... Take 1 by mouth once daily 9)  Veramyst 27.5 Mcg/spray Susp (Fluticasone furoate) .... 2 sprays each nostril once daily Prescriptions: SYNTHROID 75 MCG  TABS (LEVOTHYROXINE SODIUM) 1 once daily Brand medically necessary #90 x 3   Entered and Authorized by:   Loreen Freud DO   Signed by:   Loreen Freud DO on 03/13/2010   Method used:   Reprint   RxID:   [id number] SYNTHROID 75 MCG  TABS (LEVOTHYROXINE SODIUM) 1 once daily Brand medically necessary #90 x 3   Entered and Authorized by:   Loreen Freud DO   Signed by:   Loreen Freud DO on 03/13/2010   Method used:   Printed then faxed to ...       [facility]* (mail-order)             ,          Ph: [phone]       Fax: [phone]   RxID:   [id number] PROVERA 5 MG TABS (MEDROXYPROGESTERONE ACETATE) Take 1 tablet once a day BRAND NAME NECESSARY Brand medically necessary #30 x 3   Entered and Authorized by:   Loreen Freud DO   Signed by:   Loreen Freud DO on 03/13/2010   Method used:   Printed then faxed to ...       [facility]* (mail-order)             ,          Ph: [phone]       Fax: [phone]   RxID:   [id number] ESTROPIATE 0.625MG  take 1 by mouth once daily  #75 x 3   Entered and Authorized by:   Loreen Freud DO    Signed by:   Loreen Freud DO on 03/13/2010   Method used:   Faxed to ...       [facility]* (mail-order)             ,          Ph: [phone]       Fax: [phone]   RxID:   [id number] PROVERA 5 MG TABS (MEDROXYPROGESTERONE ACETATE) Take 1 tablet once a day BRAND NAME NECESSARY Brand medically necessary #30 x 3   Entered and Authorized by:   Loreen Freud DO   Signed by:   Loreen Freud DO on 03/13/2010   Method used:   Print then Give to Patient   RxID:   [id number] SYNTHROID 75 MCG  TABS (LEVOTHYROXINE SODIUM) 1 once daily Brand medically necessary #90 x 3   Entered and Authorized by:   Loreen Freud DO   Signed by:   Loreen Freud DO on 03/13/2010   Method used:   Print then Give to Patient   RxID:   [id number] DYNACIRC CR 5 MG  TB24 (ISRADIPINE) TAKE ONE TABLET DAILY  #  90 x 03   Entered and Authorized by:   Loreen Freud DO   Signed by:   Loreen Freud DO on 03/13/2010   Method used:   Electronically to        [facility]* (mail-order)             ,          Ph: [phone]       Fax: [phone]   RxID:   [id number] MOBIC 15 MG  TABS (MELOXICAM) 1/2 -1 by mouth once daily  #90 x 3   Entered and Authorized by:   Loreen Freud DO   Signed by:   Loreen Freud DO on 03/13/2010   Method used:   Electronically to        [facility]* (mail-order)             ,          Ph: [phone]       Fax: [phone]   RxID:   [id number] PROVERA 5 MG TABS (MEDROXYPROGESTERONE ACETATE) Take 1 tablet once a day BRAND NAME NECESSARY  #30 x 3   Entered and Authorized by:   Loreen Freud DO   Signed by:   Loreen Freud DO on 03/13/2010   Method used:   Electronically to        [facility]* (mail-order)             ,          Ph: [phone]       Fax: [phone]   RxID:   [id number] SPIRONOLACTONE 25 MG TABS (SPIRONOLACTONE) Take 1/2 tablet by mouth once a day  #45 x 3   Entered and Authorized by:   Loreen Freud DO   Signed by:   Loreen Freud  DO on 03/13/2010   Method used:   Electronically to        [facility]* (mail-order)             ,          Ph: [phone]       Fax: [phone]   RxID:   [id number] PROPRANOLOL HCL 10 MG TABS (PROPRANOLOL HCL) Take 1 tablet twice a day  #180 x 3   Entered and Authorized by:   Loreen Freud DO   Signed by:   Loreen Freud DO on 03/13/2010   Method used:   Electronically to        [facility]* (mail-order)             ,          Ph: [phone]       Fax: [phone]   RxID:   [id number]    EKG  Procedure date:  03/13/2010  Findings:      Normal sinus rhythm with rate of:  67bpm   Last PAP:  NEGATIVE FOR INTRAEPITHELIAL LESIONS OR MALIGNANCY. (03/12/2009 12:00:00 AM) PAP Next Due:  Not Indicated  Appended Document: CPX W/PAP,FASTING,MEDICARE & GEHA/RH.....  Laboratory Results   Urine Tests   Date/Time Reported: March 13, 2010 9:52 AM   Routine Urinalysis   Color: yellow Appearance: Clear Glucose: negative   (Normal Range: Negative) Bilirubin: negative   (Normal Range: Negative) Ketone: negative   (Normal Range: Negative) Spec. Gravity: 1.025   (Normal Range: 1.003-1.035) Blood: small   (Normal Range: Negative) pH: 5.0   (Normal Range: 5.0-8.0) Protein: negative   (Normal Range: Negative) Urobilinogen: negative   (Normal  Range: 0-1) Nitrite: negative   (Normal Range: Negative) Leukocyte Esterace: negative   (Normal Range: Negative)    Comments: Floydene Flock  March 13, 2010 9:53 AM cx sent

## 2010-12-18 NOTE — Assessment & Plan Note (Signed)
Summary: FOR SINUS//PH   Vital Signs:  Patient profile:   70 year old female Weight:      182.4 pounds O2 Sat:      96 % on Room air Temp:     98.0 degrees F oral Pulse rate:   74 / minute Pulse rhythm:   regular BP sitting:   128 / 90  (right arm) Cuff size:   large  Vitals Entered By: Almeta Monas CMA Duncan Dull) (November 27, 2010 2:48 PM)  O2 Flow:  Room air CC: x4days c/o sinus inf--said she is having drainage, hacking cough with discolored sputum, URI symptoms   History of Present Illness:       This is a 70 year old woman who presents with URI symptoms.  The symptoms began 1 week ago.  Pt is taking mucinex and tylenol with no relief.  The patient complains of nasal congestion, purulent nasal discharge, productive cough, earache, and sick contacts, but denies sore throat and dry cough.  Associated symptoms include low-grade fever (<100.5 degrees).  The patient denies fever, fever of 100.5-103 degrees, fever of 103.1-104 degrees, fever to >104 degrees, stiff neck, dyspnea, wheezing, rash, vomiting, diarrhea, use of an antipyretic, and response to antipyretic.  The patient denies itchy watery eyes, itchy throat, sneezing, seasonal symptoms, response to antihistamine, headache, muscle aches, and severe fatigue.  The patient denies the following risk factors for Strep sinusitis: unilateral facial pain, unilateral nasal discharge, poor response to decongestant, double sickening, tooth pain, Strep exposure, tender adenopathy, and absence of cough.    Current Medications (verified): 1)  Propranolol Hcl 10 Mg Tabs (Propranolol Hcl) .... Take 1 Tablet Twice A Day 2)  Spironolactone 25 Mg Tabs (Spironolactone) .... Take 1/2 Tablet By Mouth Once A Day 3)  Provera 5 Mg Tabs (Medroxyprogesterone Acetate) .... Take 1 Tablet Once A Day Brand Name Necessary 4)  Synthroid 75 Mcg  Tabs (Levothyroxine Sodium) .Marland Kitchen.. 1 Once Daily 5)  Zantac 75   Tabs (Ranitidine Hcl Tabs) .Marland Kitchen.. 1 Once Daily 6)  Mobic 15 Mg   Tabs (Meloxicam) .... 1/2 -1 By Mouth Once Daily 7)  Dynacirc Cr 5 Mg  Tb24 (Isradipine) .... Take One Tablet Daily 8)  Estropiate 0.625mg  .... Take 1 By Mouth Once Daily 9)  Veramyst 27.5 Mcg/spray Susp (Fluticasone Furoate) .... 2 Sprays Each Nostril Once Daily 10)  Tylenol Arthritis Pain 650 Mg Cr-Tabs (Acetaminophen) .Marland Kitchen.. 1 By Mouth At Bedtime 11)  Hydrocodone-Homatropine 5-1.5 Mg/57ml Syrp (Hydrocodone-Homatropine) .Marland Kitchen.. 1-2 Tsp By Mouth At Bedtime As Needed Cough 12)  Ceftin 500 Mg Tabs (Cefuroxime Axetil) .Marland Kitchen.. 1 By Mouth Two Times A Day  Allergies (verified): 1)  ! * Antihistamines 2)  ! * Decongestants 3)  ! Codeine  Past History:  Past medical, surgical, family and social histories (including risk factors) reviewed for relevance to current acute and chronic problems.  Past Medical History: Reviewed history from 03/02/2008 and no changes required. +MRSA HTN arthritis benigh rapid heart beat transitional cell gall bladder  Past Surgical History: Reviewed history from 03/13/2010 and no changes required. tonsillectomy partial hysterectomy-1987 melanoma left leg-1977 skin ca bladder transitional cell 1999 cataracts-2004 teeth implants osteopenia-07-2005 melanoma--r foot 06/24/2009  Family History: Reviewed history from 03/02/2008 and no changes required. Family History Diabetes 1st degree relative- M Family History of Stroke F 1st degree relative <60-------69yo  hemorrhagic  M-- alz dementia  Social History: Reviewed history from 03/13/2010 and no changes required. 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  Otoscopic examination reveals clear canals,  tympanic membranes are intact bilaterally without bulging, retraction, inflammation or discharge. Hearing is grossly normal bilaterally. Nose:  L frontal sinus tenderness, L maxillary sinus tenderness, R frontal sinus tenderness, and R maxillary sinus tenderness.   Mouth:  Oral mucosa and oropharynx without lesions or exudates.  Teeth in good repair. Neck:  No deformities, masses, or tenderness noted. Lungs:  Normal respiratory effort, chest expands symmetrically. Lungs are clear to auscultation, no crackles or wheezes. Heart:  normal rate and no murmur.   Extremities:  No clubbing, cyanosis, edema, or deformity noted with normal full range of motion of all joints.   Psych:  Cognition and judgment appear intact. Alert and cooperative with normal attention span and concentration. No apparent delusions, illusions, hallucinations   Impression & Recommendations:  Problem # 1:  SINUSITIS - ACUTE-NOS (ICD-461.9)  Her updated medication list for this problem includes:    Veramyst 27.5 Mcg/spray Susp (Fluticasone furoate) .Marland Kitchen... 2 sprays each nostril once daily    Hydrocodone-homatropine 5-1.5 Mg/30ml Syrp (Hydrocodone-homatropine) .Marland Kitchen... 1-2 tsp by mouth at bedtime as needed cough    Ceftin 500 Mg Tabs (Cefuroxime axetil) .Marland Kitchen... 1 by mouth two times a day  Instructed on treatment. Call if symptoms persist or worsen.   Complete Medication List: 1)  Propranolol Hcl 10 Mg Tabs (Propranolol hcl) .... Take 1 tablet twice a day 2)  Spironolactone 25 Mg Tabs (Spironolactone) .... Take 1/2 tablet by mouth once a day 3)  Provera 5 Mg Tabs (Medroxyprogesterone acetate) .... Take 1 tablet once a day brand name necessary 4)  Synthroid 75 Mcg Tabs (Levothyroxine sodium) .Marland Kitchen.. 1 once daily 5)  Zantac 75 Tabs (Ranitidine hcl tabs) .Marland Kitchen.. 1 once daily 6)  Mobic 15 Mg Tabs (Meloxicam) .... 1/2 -1 by mouth once daily 7)  Dynacirc Cr 5 Mg Tb24 (Isradipine) .... Take one tablet daily 8)  Estropiate 0.625mg   .... Take 1  by mouth once daily 9)  Veramyst 27.5 Mcg/spray Susp (Fluticasone furoate) .... 2 sprays each nostril once daily 10)  Tylenol Arthritis Pain 650 Mg Cr-tabs (Acetaminophen) .Marland Kitchen.. 1 by mouth at bedtime 11)  Hydrocodone-homatropine 5-1.5 Mg/26ml Syrp (Hydrocodone-homatropine) .Marland Kitchen.. 1-2 tsp by mouth at bedtime as needed cough 12)  Ceftin 500 Mg Tabs (Cefuroxime axetil) .Marland Kitchen.. 1 by mouth two times a day Prescriptions: CEFTIN 500 MG TABS (CEFUROXIME AXETIL) 1 by mouth two times a day  #20 x 0   Entered and Authorized by:   Loreen Freud DO   Signed by:   Loreen Freud DO on 11/27/2010   Method used:   Electronically to        [facility]* (retail)       [address]       [place], [place]  [id number]       Ph: [phone]       Fax: [phone]   RxID:   [id number] HYDROCODONE-HOMATROPINE 5-1.5 MG/5ML SYRP (HYDROCODONE-HOMATROPINE) 1-2 tsp by mouth at bedtime as needed cough  #6 oz x 0   Entered and Authorized by:   Loreen Freud DO   Signed by:   Loreen Freud DO on 11/27/2010   Method used:   Print then Give to Patient   RxID:  [id number]    Orders Added: 1)  Est. Patient Level III [30865]

## 2010-12-18 NOTE — Letter (Signed)
Summary: [facility]: Immunoassay Fecal Occult Blood (iFOB) Order Form  [facility] [place]  [address] [place], [place] [address]   Phone: [phone]  Fax: [phone]      [facility]: Immunoassay Fecal Occult Blood (iFOB) Order Form   March 13, 2010 MRN: [id number]   Wendy Velazquez 1941/02/20   Physicican Name:____Yvonne Lowne,DO_____________________  Diagnosis Code:_____v76.51_____________________      Army Fossa CMA

## 2010-12-18 NOTE — Assessment & Plan Note (Signed)
Summary: COLD CONGESTION AND COUGHING//PH   Vital Signs:  Patient profile:   70 year old female Weight:      182.4 pounds BMI:     30.94 Temp:     98.5 degrees F oral Pulse rate:   72 / minute Resp:     15 per minute BP sitting:   126 / 88  (left arm) Cuff size:   large  Vitals Entered By: Shonna Chock CMA (December 12, 2010 12:02 PM) CC: Cold, congestion and cough since Wed , URI symptoms   Primary Care Provider:  Laury Axon  CC:  Cold, congestion and cough since Wed , and URI symptoms.  History of Present Illness: Onset 01/25 as ST & congestion L head.  The patient now reports purulent nasal discharge and productive cough, but denies earache.  The patient denies fever, dyspnea, and wheezing.  The patient also reports headache.  Risk factors for Strep sinusitis include unilateral facial pain.  The patient denies the following risk factors for Strep sinusitis: tooth pain and tender adenopathy.   She completed Ceftin 01/22. Non smoker; no PMH of asthma.  Allergies: 1)  ! * Antihistamines 2)  ! * Decongestants 3)  ! Codeine  Physical Exam  General:  well-nourished,in no acute distress; alert,appropriate and cooperative throughout examination Ears:  External ear exam shows no significant lesions or deformities.  Otoscopic examination reveals clear canals, tympanic membranes are intact bilaterally without bulging, retraction, inflammation or discharge. Hearing aids bilaterally Nose:  External nasal examination shows no deformity or inflammation. Nasal mucosa are pink and moist without lesions or exudates. Septal dislocation & deviation Mouth:  Oral mucosa and oropharynx without lesions or exudates.  Teeth in good repair. Lungs:  Normal respiratory effort, chest expands symmetrically. Lungs are clear to auscultation, no crackles or wheezes. Extremities:  No clubbing, cyanosis, edema.  Cervical Nodes:  No lymphadenopathy noted Axillary Nodes:  No palpable lymphadenopathy   Impression &  Recommendations:  Problem # 1:  SINUSITIS - ACUTE-NOS (ICD-461.9)  Her updated medication list for this problem includes:    Veramyst 27.5 Mcg/spray Susp (Fluticasone furoate) .Marland Kitchen... 2 sprays each nostril once daily    Hydrocodone-homatropine 5-1.5 Mg/54ml Syrp (Hydrocodone-homatropine) .Marland Kitchen... 1-2 tsp by mouth at bedtime as needed cough    Amoxicillin-pot Clavulanate 500-125 Mg Tabs (Amoxicillin-pot clavulanate) .Marland Kitchen... 1 every 12 hrs with a meal  Orders: Prescription Created Electronically [id number])  Complete Medication List: 1)  Propranolol Hcl 10 Mg Tabs (Propranolol hcl) .... Take 1 tablet twice a day 2)  Spironolactone 25 Mg Tabs (Spironolactone) .... Take 1/2 tablet by mouth once a day 3)  Provera 5 Mg Tabs (Medroxyprogesterone acetate) .... Take 1 tablet once a day brand name necessary 4)  Synthroid 75 Mcg Tabs (Levothyroxine sodium) .Marland Kitchen.. 1 once daily 5)  Zantac 75 Tabs (Ranitidine hcl tabs) .Marland Kitchen.. 1 once daily 6)  Mobic 15 Mg Tabs (Meloxicam) .... 1/2 -1 by mouth once daily 7)  Dynacirc Cr 5 Mg Tb24 (Isradipine) .... Take one tablet daily 8)  Estropiate 0.625mg   .... Take 1 by mouth once daily 9)  Veramyst 27.5 Mcg/spray Susp (Fluticasone furoate) .... 2 sprays each nostril once daily 10)  Tylenol Arthritis Pain 650 Mg Cr-tabs (Acetaminophen) .Marland Kitchen.. 1 by mouth at bedtime 11)  Hydrocodone-homatropine 5-1.5 Mg/75ml Syrp (Hydrocodone-homatropine) .Marland Kitchen.. 1-2 tsp by mouth at bedtime as needed cough 12)  Amoxicillin-pot Clavulanate 500-125 Mg Tabs (Amoxicillin-pot clavulanate) .Marland Kitchen.. 1 every 12 hrs with a meal  Patient Instructions: 1)  Use nasal spray  once daily - two times a day as needed after Neti pot. 2)  Drink as much  NON dairy fluid as you can tolerate for the next few days. Prescriptions: AMOXICILLIN-POT CLAVULANATE 500-125 MG TABS (AMOXICILLIN-POT CLAVULANATE) 1 every 12 hrs with a meal  #20 x 0   Entered and Authorized by:   Marga Melnick MD   Signed by:   Marga Melnick MD on  12/12/2010   Method used:   Electronically to        [facility]* (retail)       [address]       [place], [place]  [id number]       Ph: [phone]       Fax: [phone]   RxID:   [id number]    Orders Added: 1)  Est. Patient Level III [24401] 2)  Prescription Created Electronically [phone]

## 2010-12-18 NOTE — Assessment & Plan Note (Signed)
Summary: EAR CANAL ISN'T STRAIGHT PER AUDIOLOGIST/DISCUSS SOMETHING EL...   Vital Signs:  Patient profile:   70 year old female Weight:      182.8 pounds Temp:     98.3 degrees F oral BP sitting:   126 / 76  (left arm) Cuff size:   large  Vitals Entered By: Almeta Monas CMA Duncan Dull) (October 21, 2010 3:17 PM) CC: wants the left ear canal checked   History of Present Illness: Pt here to have me check her ears.  audiologist told her her ear canal was not straight and that is why her hearing aid hurt her ear. No other complaints.   Pt also just wanted me to know that her son was coming in to see me as a patient as well.     Current Medications (verified): 1)  Propranolol Hcl 10 Mg Tabs (Propranolol Hcl) .... Take 1 Tablet Twice A Day 2)  Spironolactone 25 Mg Tabs (Spironolactone) .... Take 1/2 Tablet By Mouth Once A Day 3)  Provera 5 Mg Tabs (Medroxyprogesterone Acetate) .... Take 1 Tablet Once A Day Brand Name Necessary 4)  Synthroid 75 Mcg  Tabs (Levothyroxine Sodium) .Marland Kitchen.. 1 Once Daily 5)  Zantac 75   Tabs (Ranitidine Hcl Tabs) .Marland Kitchen.. 1 Once Daily 6)  Mobic 15 Mg  Tabs (Meloxicam) .... 1/2 -1 By Mouth Once Daily 7)  Dynacirc Cr 5 Mg  Tb24 (Isradipine) .... Take One Tablet Daily 8)  Estropiate 0.625mg  .... Take 1 By Mouth Once Daily 9)  Veramyst 27.5 Mcg/spray Susp (Fluticasone Furoate) .... 2 Sprays Each Nostril Once Daily 10)  Tylenol Arthritis Pain 650 Mg Cr-Tabs (Acetaminophen) .Marland Kitchen.. 1 By Mouth At Bedtime  Allergies (verified): 1)  ! * Antihistamines 2)  ! * Decongestants 3)  ! Codeine  Past History:  Past Medical History: Last updated: 03/02/2008 +MRSA HTN arthritis benigh rapid heart beat transitional cell gall bladder  Past Surgical History: Last updated: 03/13/2010 tonsillectomy partial hysterectomy-1987 melanoma left leg-1977 skin ca bladder transitional cell 1999 cataracts-2004 teeth implants osteopenia-07-2005 melanoma--r foot 06/24/2009  Family  History: Last updated: 03/02/2008 Family History Diabetes 1st degree relative- M Family History of Stroke F 1st degree relative <60-------69yo  hemorrhagic  M-- alz dementia  Social History: Last updated: 03/13/2010 Occupation: accounting 4 days a week Former Smoker Alcohol use-yes Drug use-no Regular exercise-yes Widow/Widower  Risk Factors: Alcohol Use: <1 (03/13/2010) Caffeine Use: 4 (03/13/2010) Exercise: yes (03/13/2010)  Risk Factors: Smoking Status: quit > 6 months (03/13/2010) Packs/Day: 1982 (03/13/2010) Passive Smoke Exposure: no (03/13/2010)  Family History: Reviewed history from 03/02/2008 and no changes required. Family History Diabetes 1st degree relative- M Family History of Stroke F 1st degree relative <60-------70yo  hemorrhagic  M-- alz dementia  Social History: Reviewed history from 03/13/2010 and no changes required. 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  Otoscopic examination reveals clear canals, tympanic membranes are intact bilaterally without bulging, retraction, inflammation or discharge. Hearing is grossly normal bilaterally. Neck:  No deformities, masses, or tenderness noted. Lungs:  Normal respiratory effort, chest expands symmetrically. Lungs are clear to auscultation, no crackles or wheezes. Psych:  Oriented X3.     Impression & Recommendations:  Problem # 1:  HEARING LOSS, BILATERAL (ICD-389.9) pt using  smaller hearing aid per audiologist rto prn  Complete Medication List: 1)  Propranolol Hcl 10 Mg Tabs (Propranolol hcl) .... Take 1 tablet twice a day 2)  Spironolactone 25 Mg Tabs (Spironolactone) .... Take 1/2 tablet  by mouth once a day 3)  Provera 5 Mg Tabs (Medroxyprogesterone acetate) .... Take 1 tablet once a day brand name necessary 4)  Synthroid 75 Mcg Tabs (Levothyroxine sodium) .Marland Kitchen.. 1 once daily 5)  Zantac 75 Tabs (Ranitidine hcl tabs) .Marland Kitchen.. 1 once daily 6)  Mobic 15 Mg Tabs (Meloxicam) .... 1/2 -1 by mouth once daily 7)  Dynacirc Cr 5 Mg Tb24 (Isradipine) .... Take one tablet daily 8)  Estropiate 0.625mg   .... Take 1 by mouth once daily 9)  Veramyst 27.5 Mcg/spray Susp (Fluticasone furoate) .... 2 sprays each nostril once daily 10)  Tylenol Arthritis Pain 650 Mg Cr-tabs (Acetaminophen) .Marland Kitchen.. 1 by mouth at bedtime   Orders Added: 1)  Est. Patient Level III [78295]

## 2010-12-26 ENCOUNTER — Encounter: Payer: Self-pay | Admitting: Family Medicine

## 2010-12-26 ENCOUNTER — Ambulatory Visit (INDEPENDENT_AMBULATORY_CARE_PROVIDER_SITE_OTHER): Payer: Medicare Other | Admitting: Family Medicine

## 2010-12-26 DIAGNOSIS — B373 Candidiasis of vulva and vagina: Secondary | ICD-10-CM | POA: Insufficient documentation

## 2010-12-26 DIAGNOSIS — B3731 Acute candidiasis of vulva and vagina: Secondary | ICD-10-CM | POA: Insufficient documentation

## 2011-01-07 NOTE — Assessment & Plan Note (Signed)
Summary: sore on bottom/cbs   Vital Signs:  Patient profile:   70 year old female Weight:      182.4 pounds Temp:     98.6 degrees F oral BP sitting:   132 / 90  (right arm) Cuff size:   large  Vitals Entered By: Almeta Monas CMA Duncan Dull) (December 26, 2010 10:19 AM) CC: c/o white spot on her bottom--perineal area   History of Present Illness: Pt here c/o white spots on vulvar area.  + ichy and irritated.  Pt has used otc cream with some relief of itching.  No d/c  Current Medications (verified): 1)  Propranolol Hcl 10 Mg Tabs (Propranolol Hcl) .... Take 1 Tablet Twice A Day 2)  Spironolactone 25 Mg Tabs (Spironolactone) .... Take 1/2 Tablet By Mouth Once A Day 3)  Provera 5 Mg Tabs (Medroxyprogesterone Acetate) .... Take 1 Tablet Once A Day Brand Name Necessary 4)  Synthroid 75 Mcg  Tabs (Levothyroxine Sodium) .Marland Kitchen.. 1 Once Daily 5)  Zantac 75   Tabs (Ranitidine Hcl Tabs) .Marland Kitchen.. 1 Once Daily 6)  Mobic 15 Mg  Tabs (Meloxicam) .... 1/2 -1 By Mouth Once Daily 7)  Dynacirc Cr 5 Mg  Tb24 (Isradipine) .... Take One Tablet Daily 8)  Estropiate 0.625mg  .... Take 1 By Mouth Once Daily 9)  Nasonex 50 Mcg/act Susp (Mometasone Furoate) .... 2 Sprays Each Nostril Once Daily 10)  Tylenol Arthritis Pain 650 Mg Cr-Tabs (Acetaminophen) .Marland Kitchen.. 1 By Mouth At Bedtime 11)  Hydrocodone-Homatropine 5-1.5 Mg/31ml Syrp (Hydrocodone-Homatropine) .Marland Kitchen.. 1-2 Tsp By Mouth At Bedtime As Needed Cough 12)  Fluconazole 150 Mg Tabs (Fluconazole) .Marland Kitchen.. 1 By Mouth Once Daily X1 ,  May Repeat in 3 Days As Needed 13)  Naftin 1 % Crea (Naftifine Hcl) .... Apply To Affected Area Once Daily  Allergies (verified): 1)  ! * Antihistamines 2)  ! * Decongestants 3)  ! Codeine  Past History:  Past medical, surgical, family and social histories (including risk factors) reviewed for relevance to current acute and chronic problems.  Past Medical History: Reviewed history from 03/02/2008 and no changes  required. +MRSA HTN arthritis benigh rapid heart beat transitional cell gall bladder  Past Surgical History: Reviewed history from 03/13/2010 and no changes required. tonsillectomy partial hysterectomy-1987 melanoma left leg-1977 skin ca bladder transitional cell 1999 cataracts-2004 teeth implants osteopenia-07-2005 melanoma--r foot 06/24/2009  Family History: Reviewed history from 03/02/2008 and no changes required. Family History Diabetes 1st degree relative- M Family History of Stroke F 1st degree relative <60-------70yo  hemorrhagic  M-- alz dementia  Social History: Reviewed history from 03/13/2010 and no changes required. Occupation: accounting 4 days a week Former Smoker Alcohol use-yes Drug use-no Regular exercise-yes Widow/Widower  Review of Systems      See HPI  Physical Exam  General:  Well-developed,well-nourished,in no acute distress; alert,appropriate and cooperative throughout examination Genitalia:  + white plaques and irritation from scratching Skin:  Intact without suspicious lesions or rashes Psych:  Oriented X3 and normally interactive.     Impression & Recommendations:  Problem # 1:  CANDIDIASIS OF VULVA AND VAGINA (ICD-112.1)  Her updated medication list for this problem includes:    Fluconazole 150 Mg Tabs (Fluconazole) .Marland Kitchen... 1 by mouth once daily x1 ,  may repeat in 3 days as needed    Naftin 1 % Crea (Naftifine hcl) .Marland Kitchen... Apply to affected area once daily  Discussed treatment regimen and preventive measures.   Orders: Prescription Created Electronically [phone])  Complete Medication List: 1)  Propranolol Hcl 10 Mg Tabs (Propranolol hcl) .... Take 1 tablet twice a day 2)  Spironolactone 25 Mg Tabs (Spironolactone) .... Take 1/2 tablet by mouth once a day 3)  Provera 5 Mg Tabs (Medroxyprogesterone acetate) .... Take 1 tablet once a day brand name necessary 4)  Synthroid 75 Mcg Tabs (Levothyroxine sodium) .Marland Kitchen.. 1 once daily 5)  Zantac 75  Tabs (Ranitidine hcl tabs) .Marland Kitchen.. 1 once daily 6)  Mobic 15 Mg Tabs (Meloxicam) .... 1/2 -1 by mouth once daily 7)  Dynacirc Cr 5 Mg Tb24 (Isradipine) .... Take one tablet daily 8)  Estropiate 0.625mg   .... Take 1 by mouth once daily 9)  Nasonex 50 Mcg/act Susp (Mometasone furoate) .... 2 sprays each nostril once daily 10)  Tylenol Arthritis Pain 650 Mg Cr-tabs (Acetaminophen) .Marland Kitchen.. 1 by mouth at bedtime 11)  Hydrocodone-homatropine 5-1.5 Mg/60ml Syrp (Hydrocodone-homatropine) .Marland Kitchen.. 1-2 tsp by mouth at bedtime as needed cough 12)  Fluconazole 150 Mg Tabs (Fluconazole) .Marland Kitchen.. 1 by mouth once daily x1 ,  may repeat in 3 days as needed 13)  Naftin 1 % Crea (Naftifine hcl) .... Apply to affected area once daily Prescriptions: NASONEX 50 MCG/ACT SUSP (MOMETASONE FUROATE) 2 sprays each nostril once daily  #1 x 2   Entered and Authorized by:   Loreen Freud DO   Signed by:   Loreen Freud DO on 12/26/2010   Method used:   Electronically to        [facility]* (retail)       [address]       [place], [place]  [id number]       Ph: [phone]       Fax: [phone]   RxID:   [id number] NAFTIN 1 % CREA (NAFTIFINE HCL) apply to affected area once daily  #30 g x 1   Entered and Authorized by:   Loreen Freud DO   Signed by:   Loreen Freud DO on 12/26/2010   Method used:   Electronically to        [facility] (retail)       [address]       [place], [place]  [id number]       Ph: [phone]       Fax: [phone]   RxID:   [id number] FLUCONAZOLE 150 MG TABS (FLUCONAZOLE) 1 by mouth once daily x1 ,  May repeat in 3 days as needed  #2 x 0   Entered and Authorized by:   Loreen Freud DO   Signed by:   Loreen Freud DO on 12/26/2010   Method used:   Electronically to        [facility] (retail)       [address]       [place], [place]  [id number]       Ph: [phone]       Fax: [phone]   RxID:    [id number]    Orders Added: 1)  Est. Patient Level III [62694] 2)  Prescription Created Electronically [phone]

## 2011-02-21 LAB — BASIC METABOLIC PANEL
BUN: 19 mg/dL (ref 6–23)
CO2: 29 mEq/L (ref 19–32)
Chloride: 107 mEq/L (ref 96–112)
GFR calc non Af Amer: 53 mL/min — ABNORMAL LOW (ref >60)
Potassium: 4.4 mEq/L (ref 3.5–5.1)
Sodium: 142 mEq/L (ref 135–145)

## 2011-02-23 ENCOUNTER — Telehealth: Payer: Self-pay | Admitting: *Deleted

## 2011-02-23 DIAGNOSIS — Z780 Asymptomatic menopausal state: Secondary | ICD-10-CM

## 2011-02-23 NOTE — Telephone Encounter (Signed)
Pt states that at last cpx 03-13-10 she was informed that she needed to have bone density done. Pt is request to have it done now. Pt has pending cpx 03-16-11. Please advise

## 2011-02-23 NOTE — Telephone Encounter (Signed)
Order put in.       KP 

## 2011-02-23 NOTE — Telephone Encounter (Signed)
Ok to order as long as it has been 2 years---dx postmenopausal

## 2011-02-25 ENCOUNTER — Encounter: Payer: Self-pay | Admitting: Family Medicine

## 2011-02-26 ENCOUNTER — Encounter: Payer: Self-pay | Admitting: Family Medicine

## 2011-02-26 ENCOUNTER — Other Ambulatory Visit: Payer: Self-pay | Admitting: Family Medicine

## 2011-02-26 DIAGNOSIS — Z1231 Encounter for screening mammogram for malignant neoplasm of breast: Secondary | ICD-10-CM

## 2011-02-27 ENCOUNTER — Telehealth: Payer: Self-pay | Admitting: Family Medicine

## 2011-02-27 MED ORDER — VALSARTAN 80 MG PO TABS: 80.0000 mg | ORAL_TABLET | Freq: Every day | ORAL | Status: DC

## 2011-02-27 NOTE — Telephone Encounter (Signed)
Spoke with patient and she wanted to get a 30 day supply only and get the rest at her CPX.Marland KitchenMarland Kitchenprescription faxed      KP

## 2011-02-27 NOTE — Telephone Encounter (Signed)
The 80 mg

## 2011-02-27 NOTE — Telephone Encounter (Signed)
Patient called to report that her mail order pharmacy says that her medication = Dynacirc CR 5 mg    Is no longer available---she has talked to the pharmacist at [facility] pharmacy and they suggested that Diovan 80 mg would be a good alternative---patient says she cant take certain calcium beta blockers due to "adverse reactions"---patient wants to talk to a nurse and she what Dr Laury Axon wants to do--wants to try samples before she gets a 90 day supply---will use [facility] as her pharmacy---has CPX scheduled with Dr Laury Axon for 4/30

## 2011-02-27 NOTE — Telephone Encounter (Signed)
What dose of Diovan would you like to send in and directions?    KP

## 2011-02-27 NOTE — Telephone Encounter (Signed)
Prefer qd dosing--- can do diovan if its on her ins

## 2011-02-27 NOTE — Telephone Encounter (Signed)
Spoke with [facility] there is a  available generic Isradipine 2.5 mg or 5 mg but it is a immediate release so it is bid dosing..Please advise

## 2011-02-27 NOTE — Telephone Encounter (Signed)
Ok to try samples of the diovan---but she will need ov 2-3 weeks so we can make sure it is working

## 2011-02-28 ENCOUNTER — Other Ambulatory Visit: Payer: Self-pay | Admitting: Family Medicine

## 2011-03-16 ENCOUNTER — Ambulatory Visit (INDEPENDENT_AMBULATORY_CARE_PROVIDER_SITE_OTHER): Payer: Medicare Other | Admitting: Family Medicine

## 2011-03-16 ENCOUNTER — Encounter: Payer: Self-pay | Admitting: Family Medicine

## 2011-03-16 ENCOUNTER — Other Ambulatory Visit (HOSPITAL_COMMUNITY)
Admission: RE | Admit: 2011-03-16 | Discharge: 2011-03-16 | Disposition: A | Discharge status: Home / Self Care | Payer: Medicare Other | Source: Ambulatory Visit | Attending: Family Medicine | Admitting: Family Medicine

## 2011-03-16 VITALS — BP 138/86 | Ht 63.0 in | Wt 180.0 lb

## 2011-03-16 DIAGNOSIS — Z0000 Encounter for general adult medical examination without abnormal findings: Secondary | ICD-10-CM

## 2011-03-16 DIAGNOSIS — N951 Menopausal and female climacteric states: Secondary | ICD-10-CM

## 2011-03-16 DIAGNOSIS — C679 Malignant neoplasm of bladder, unspecified: Secondary | ICD-10-CM

## 2011-03-16 DIAGNOSIS — C4370 Malignant melanoma of unspecified lower limb, including hip: Secondary | ICD-10-CM

## 2011-03-16 DIAGNOSIS — Z780 Asymptomatic menopausal state: Secondary | ICD-10-CM

## 2011-03-16 DIAGNOSIS — E039 Hypothyroidism, unspecified: Secondary | ICD-10-CM

## 2011-03-16 DIAGNOSIS — Z13220 Encounter for screening for lipoid disorders: Secondary | ICD-10-CM

## 2011-03-16 DIAGNOSIS — M791 Myalgia, unspecified site: Secondary | ICD-10-CM

## 2011-03-16 DIAGNOSIS — I10 Essential (primary) hypertension: Secondary | ICD-10-CM

## 2011-03-16 DIAGNOSIS — IMO0001 Reserved for inherently not codable concepts without codable children: Secondary | ICD-10-CM

## 2011-03-16 DIAGNOSIS — Z01419 Encounter for gynecological examination (general) (routine) without abnormal findings: Secondary | ICD-10-CM | POA: Insufficient documentation

## 2011-03-16 DIAGNOSIS — M1990 Unspecified osteoarthritis, unspecified site: Secondary | ICD-10-CM

## 2011-03-16 LAB — CBC WITH DIFFERENTIAL/PLATELET
Basophils Relative: 0.5 % (ref 0.0–3.0)
Eosinophils Relative: 2.3 % (ref 0.0–5.0)
HCT: 41.9 % (ref 36.0–46.0)
Lymphs Abs: 1.6 10*3/uL (ref 0.7–4.0)
MCV: 91.9 fl (ref 78.0–100.0)
Monocytes Absolute: 0.4 10*3/uL (ref 0.1–1.0)
RBC: 4.55 Mil/uL (ref 3.87–5.11)
WBC: 6.3 10*3/uL (ref 4.5–10.5)

## 2011-03-16 LAB — HEPATIC FUNCTION PANEL
ALT: 19 U/L (ref 0–35)
AST: 21 U/L (ref 0–37)
Alkaline Phosphatase: 70 U/L (ref 39–117)
Bilirubin, Direct: 0.1 mg/dL (ref 0.0–0.3)
Total Protein: 6.3 g/dL (ref 6.0–8.3)

## 2011-03-16 LAB — POCT URINALYSIS DIPSTICK
Blood, UA: NEGATIVE
Protein, UA: NEGATIVE
Spec Grav, UA: 1.015
Urobilinogen, UA: 0.2
pH, UA: 6

## 2011-03-16 LAB — BASIC METABOLIC PANEL
BUN: 17 mg/dL (ref 6–23)
CO2: 28 mEq/L (ref 19–32)
Calcium: 9.1 mg/dL (ref 8.4–10.5)
Creatinine, Ser: 1 mg/dL (ref 0.4–1.2)
Glucose, Bld: 91 mg/dL (ref 70–99)

## 2011-03-16 MED ORDER — PROPRANOLOL HCL 10 MG PO TABS: 10.0000 mg | ORAL_TABLET | Freq: Two times a day (BID) | ORAL | Status: DC

## 2011-03-16 MED ORDER — ESTROPIPATE 3 MG PO TABS: ORAL_TABLET | ORAL | Status: DC

## 2011-03-16 MED ORDER — MEDROXYPROGESTERONE ACETATE 5 MG PO TABS: ORAL_TABLET | ORAL | Status: DC

## 2011-03-16 MED ORDER — MELOXICAM 15 MG PO TABS: ORAL_TABLET | ORAL | Status: DC

## 2011-03-16 MED ORDER — VALSARTAN 80 MG PO TABS: ORAL_TABLET | ORAL | Status: DC

## 2011-03-16 MED ORDER — SYNTHROID 75 MCG PO TABS: 75.0000 ug | ORAL_TABLET | Freq: Every day | ORAL | Status: DC

## 2011-03-16 MED ORDER — SPIRONOLACTONE 25 MG PO TABS: ORAL_TABLET | ORAL | Status: DC

## 2011-03-16 NOTE — Assessment & Plan Note (Signed)
Check bmd 

## 2011-03-16 NOTE — Assessment & Plan Note (Signed)
Per u rology 

## 2011-03-16 NOTE — Assessment & Plan Note (Signed)
Per derm 

## 2011-03-16 NOTE — Assessment & Plan Note (Signed)
Stable Check labs con't current dose

## 2011-03-16 NOTE — Progress Notes (Signed)
Subjective:     Wendy Velazquez is a 70 y.o. female and is here for a comprehensive physical exam. The patient reports no problems.  History   Social History  . Marital Status: Widowed    Spouse Name: N/A    Number of Children: N/A  . 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 Smoking status: Former Smoker -- 0.3 packs/day for 15 years    Quit date: 03/15/1981  . Smokeless tobacco: Never Used  . Alcohol Use: Yes  . Drug Use: No  . Sexually Active: Not Currently   Other Topics Concern  . Not on file   Social History Narrative  . No narrative on file   Health Maintenance  Topic Date Due  . Influenza Vaccine  08/17/2011  . Mammogram  02/12/2012  . Tetanus/tdap  11/29/2012  . Colonoscopy  06/01/2017  . Pneumococcal Polysaccharide Vaccine Age 46 And Over  Completed  . Zostavax  Completed    The following portions of the patient's history were reviewed and updated as appropriate: allergies, current medications, past family history, past medical history, past social history, past surgical history and problem list.  Review of Systems A comprehensive review of systems was negative except for: Musculoskeletal: positive for arthralgias   Review of Systems  Constitutional: Negative for activity change, appetite change and fatigue. dentist q17m HENT: Negative for hearing loss, congestion, tinnitus and ear discharge.   Eyes: Negative for visual disturbance (see optho q1y -- vision corrected to 20/20 w/o glasses--has reading glasses only  Respiratory: Negative for cough, chest tightness and shortness of breath.   Cardiovascular: Negative for chest pain, palpitations and leg swelling.  Gastrointestinal: Negative for abdominal pain, diarrhea, constipation and abdominal distention.  Genitourinary: Negative for urgency, frequency, decreased urine volume and difficulty urinating.  Musculoskeletal: Negative for back  pain, arthralgias and gait problem.  Skin: Negative for color change, pallor and rash.  Neurological: Negative for dizziness, light-headedness, numbness and headaches.  Hematological: Negative for adenopathy. Does not bruise/bleed easily.  Psychiatric/Behavioral: Negative for suicidal ideas, confusion, sleep disturbance, self-injury, dysphoric mood, decreased concentration and agitation.  Pt is able to read and write and can do all ADLs No risk for falling No abuse/ violence in home  Dentist-- Dr Luetta Nutting optho- Dr Elmer Picker Derm--[place] q47m Urologist--Dr Earlene Plater  Objective:    BP 138/86  Ht 5\' 3"  (1.6 m)  Wt 180 lb (81.647 kg)  BMI 31.89 kg/m2 General appearance: alert, cooperative, appears stated age, mild distress and mildly obese Head: Normocephalic, without obvious abnormality, atraumatic Eyes: conjunctivae/corneas clear. PERRL, EOM's intact. Fundi benign. Ears: normal TM's and external ear canals both ears Nose: Nares normal. Septum midline. Mucosa normal. No drainage or sinus tenderness. Throat: lips, mucosa, and tongue normal; teeth and gums normal Neck: no adenopathy, no carotid bruit, no JVD, supple, symmetrical, trachea midline and thyroid not enlarged, symmetric, no tenderness/mass/nodules Lungs: clear to auscultation bilaterally Breasts: normal appearance, no masses or tenderness Heart: regular rate and rhythm, S1, S2 normal, no murmur, click, rub or gallop Abdomen: soft, non-tender; bowel sounds normal; no masses,  no organomegaly Pelvic: cervix normal in appearance, external genitalia normal, no adnexal masses or tenderness, no cervical motion tenderness, rectovaginal septum normal, uterus normal size, shape, and consistency and vagina normal without discharge Extremities: extremities normal, atraumatic, no cyanosis or edema Pulses: 2+ and symmetric Skin: Skin color, texture, turgor normal. No rashes  or lesions Lymph nodes: Cervical, supraclavicular, and axillary nodes  normal. Neurologic: Grossly normal   psych--not suicidal ,  AAOx Assessment:    Healthy female exam.   Plan:     See After Visit Summary for Counseling Recommendations

## 2011-03-16 NOTE — Patient Instructions (Addendum)
Con't diet and exercise rto 6 months Calcium 1200-1500 mg daily with 1000u vita D

## 2011-03-16 NOTE — Progress Notes (Signed)
Addended by: Almeta Monas on: 03/16/2011 11:04 AM   Modules accepted: Orders

## 2011-03-16 NOTE — Assessment & Plan Note (Signed)
Stable con't meds Check labs 

## 2011-03-17 ENCOUNTER — Encounter: Payer: Self-pay | Admitting: *Deleted

## 2011-03-24 ENCOUNTER — Ambulatory Visit: Payer: Medicare Other

## 2011-03-25 ENCOUNTER — Other Ambulatory Visit: Payer: Medicare Other

## 2011-03-26 ENCOUNTER — Ambulatory Visit
Admission: RE | Admit: 2011-03-26 | Discharge: 2011-03-26 | Disposition: A | Discharge status: Home / Self Care | Payer: Medicare Other | Source: Ambulatory Visit | Attending: Family Medicine | Admitting: Family Medicine

## 2011-03-26 DIAGNOSIS — Z780 Asymptomatic menopausal state: Secondary | ICD-10-CM

## 2011-03-26 DIAGNOSIS — Z1231 Encounter for screening mammogram for malignant neoplasm of breast: Secondary | ICD-10-CM

## 2011-03-31 NOTE — Op Note (Signed)
Wendy Velazquez, Wendy Velazquez              ACCOUNT NO.:  [id number]   MEDICAL RECORD NO.:  [id number]          PATIENT TYPE:  AMB   LOCATION:  DSC                          FACILITY:  [facility]   PHYSICIAN:  Loreta Ave, MD DATE OF BIRTH:  [birth year]   DATE OF PROCEDURE:  06/24/2009  DATE OF DISCHARGE:                               OPERATIVE REPORT   PREOPERATIVE DIAGNOSIS:  1. Melanoma in situ, right foot.  2. Lesion of uncertain behavior, left forearm.   PROCEDURE PERFORMED:  1. Wide local excision of right foot lesion.  2. Full-thickness skin graft from the right groin to the right foot.  3. VAC placement.  4. Excisional biopsy of left forearm lesion.   SURGEON:  Loreta Ave, MD   ASSISTANTS:  None.   ANESTHESIA:  General.   ESTIMATED BLOOD LOSS:  Minimal.   URINE OUTPUT:  Not recorded.   IV FLUIDS:  Per Anesthesia.   DRAINS:  None.   COMPLICATIONS:  None.   SPECIMENS:  1. Right foot melanoma in situ.  2. Left forearm lesion.   CLINICAL INDICATIONS:  Wendy Velazquez is a 70 year old Caucasian female  with a history of melanoma.  She has a newly diagnosed right foot  melanoma in situ.  This was diagnosed by shave biopsy.  She presents at  this time for wide local excision and full-thickness skin graft  coverage.  Wendy Velazquez understands the risks of surgery to include bleeding,  infection, recurrence of her melanoma, positive margins of the excision  today, damage to the nearby structures, and the need for more surgery.  She desires to proceed.   DESCRIPTION OF THE OPERATION:  The patient was brought to the operating  room and placed in the supine position on the operating room table.  After a smooth and routine induction of general anesthesia with  laryngeal mask airway, the left forearm was prepped with ChloraPrep and  draped into a sterile field.  The atypical melanocytic nevus of the left  forearm, which had been identified with the patient preoperatively, was  infiltrated with 0.5% Marcaine plain.  A fusiform incision was designed  longitudinally around the lesion.  This was excised down to subcutaneous  fat.  The wound was irrigated with normal saline.  A 4-0 Monocryl suture  was placed in buried fashion in the dermis, and two 5-0 nylon sutures  were placed simply in the skin.  A Band-Aid was applied.  Next, the  patient's right groin and right foot were prepped with ChloraPrep and  draped into a sterile field.   The right foot lesion measured 2.0 x 2.2 cm.  A 5-mm margins were drawn  around the lesion to excise the size of 3.0 x 3.2 cm.  This area of the  right foot was injected with 0.5% Marcaine with 1:100,000 epinephrine.  After waiting for the hemostatic effects of epinephrine to take hold,  the skin along the proposed resection was incised with a 15 blade, and  the cutaneous lesion was removed from deeper structures with a 15 blade.  Hemostasis was obtained with bipolar electrocautery.  Next, a moist Ray-  Tec sponge was placed on the wound and wrapped with Coban wrap for  compression and hemostasis.  Attention was turned to the right thigh.  A  skin paddle was designed being 3.0 x 3.2 cm just inferior to the groin  flexion crease.  This was designed obliquely.  A 10 mL of 0.5% Marcaine  with 1:100,000 epinephrine were injected subcutaneously.  After waiting  for the hemostatic effects of epinephrine to take hold, the skin was  incised with a 10 blade.  The skin was then removed off the subcutaneous  fat with the same 10 blade.  Next, the edges of the wound were  undermined with electrocautery.  Irrigation was performed of the right  groin wound.  Hemostasis was again checked.  The skin was then closed  with interrupted 4-0 Monocryl sutures and a running 4-0 subcuticular  Monocryl stitch.  Dermabond and Steri-Strips were applied.   Next, the full-thickness skin graft was defatted with scissors.  It was  trimmed to fit the right foot  defect and sewn into place with 4-0  chromic suture.  A VAC dressing was placed on the skin graft recipient  site over a layer of Xeroform.  This was set to 125 mm of continuous  suction with good seal.  The patient was then placed in a posterior  ankle splint to limit shear motion across the plantar aspect of her  right foot.  Sponge and needle counts were reported as correct x2.  Next, the patient was extubated and transported to the recovery room in  stable condition.      Loreta Ave, MD  Electronically Signed     CF/MEDQ  D:  06/24/2009  T:  06/25/2009  Job:  [id number]

## 2011-04-10 ENCOUNTER — Encounter: Payer: Self-pay | Admitting: Family Medicine

## 2011-06-11 ENCOUNTER — Other Ambulatory Visit: Payer: Self-pay | Admitting: Dermatology

## 2011-06-16 ENCOUNTER — Encounter: Payer: Self-pay | Admitting: Family

## 2011-06-16 ENCOUNTER — Ambulatory Visit (INDEPENDENT_AMBULATORY_CARE_PROVIDER_SITE_OTHER): Payer: Medicare Other | Admitting: Family

## 2011-06-16 VITALS — BP 142/100 | HR 72 | Temp 97.9°F | Resp 16 | Ht 63.0 in | Wt 181.1 lb

## 2011-06-16 DIAGNOSIS — H6690 Otitis media, unspecified, unspecified ear: Secondary | ICD-10-CM

## 2011-06-16 DIAGNOSIS — H6692 Otitis media, unspecified, left ear: Secondary | ICD-10-CM

## 2011-06-16 MED ORDER — CEFUROXIME AXETIL 500 MG PO TABS: 500.0000 mg | ORAL_TABLET | Freq: Two times a day (BID) | ORAL | Status: AC

## 2011-06-16 MED ORDER — FLUCONAZOLE 150 MG PO TABS: ORAL_TABLET | ORAL | Status: DC

## 2011-06-16 NOTE — Patient Instructions (Signed)
Please call if your symptoms worsen or if you are not improved in 48-72 hours.

## 2011-06-16 NOTE — Assessment & Plan Note (Signed)
Will treat with ceftin x 10 days.  Pt requested rx "in case I get a yeast infection." Rx provided for diflucan.

## 2011-06-16 NOTE — Progress Notes (Signed)
Subjective:    Patient ID: Wendy Velazquez, female    DOB: 1940/12/13, 70 y.o.   MRN: [id number]  HPI  Left ear pain- started 2 weeks ago.  Had some associated left sided throat pain.  She has been taking mucinex and tylenol.  Today she has been "spitting up green mucous."  Temp at home this morning was 97 (she reports that her normal temperature is 96.) Notes occasional cough- dry until this morning.  Energy is poor.  Throbbing pressure overlying left cheek.  She reports that she sees Dr. Donnetta Simpers ENT.  Reports that she has responded best to doxycycline in the past.      Review of Systems See HPI  Past Medical History  Diagnosis Date  . Hypertension   . Arthritis   . MRSA (methicillin resistant staph aureus) culture positive   . Rapid heart beat     benigh  . Osteopenia   . Transitional cell carcinoma   . Melanoma     History   Social History  . Marital Status: Widowed    Spouse Name: N/A    Number of Children: N/A  . 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 Smoking status: Former Smoker -- 0.3 packs/day for 15 years    Quit date: 03/15/1981  . Smokeless tobacco: Never Used  . Alcohol Use: Yes  . Drug Use: No  . Sexually Active: Not Currently   Other Topics Concern  . Not on file   Social History Narrative  . No narrative on file    Past Surgical History  Procedure Date  . Tonsillectomy   . Abdominal hysterectomy 1987    partial  . Melanoma excision 1977    left leg  . Melanoma excision 06-24-09    r foot  . Cataract extraction 2004  . Skin cancer excision 1999    bladder transitional cell  . Eye surgery     cataract b/l  . Teeth implants     Family History  Problem Relation Age of Onset  . Diabetes Mother   . Alzheimer's disease Mother   . Dementia Mother   . Stroke Father   . Hypertension Father     Allergies  Allergen Reactions  . Codeine     Current Outpatient  Prescriptions on File Prior to Visit  Medication Sig Dispense Refill  . acetaminophen (TYLENOL ARTHRITIS PAIN) 650 MG CR tablet Take 650 mg by mouth at bedtime.        Marland Kitchen estropipate (OGEN) 3 MG tablet Take 1/4 for 25 days each  month  30 tablet  3  . meloxicam (MOBIC) 15 MG tablet 1/2-1 by mouth once daily  90 tablet  3  . propranolol (INDERAL) 10 MG tablet Take 1 tablet (10 mg total) by mouth 2 (two) times daily.  180 tablet  3  . ranitidine (ZANTAC 75) 75 MG tablet Take 75 mg by mouth daily.        Marland Kitchen spironolactone (ALDACTONE) 25 MG tablet 1/2 tab po qd  135 tablet  3  . SYNTHROID 75 MCG tablet Take 1 tablet (75 mcg total) by mouth daily. Brand name necessary  90 tablet  3  . valsartan (DIOVAN) 80 MG tablet 1 po qd  90 tablet  3  . DISCONTD: medroxyPROGESTERone (PROVERA) 5 MG tablet 1/2 tab po qd as directed.  Brand name necessary.  45  tablet  3    BP 142/100  Pulse 72  Temp(Src) 97.9 F (36.6 C) (Oral)  Resp 16  Ht 5\' 3"  (1.6 m)  Wt 181 lb 1.3 oz (82.137 kg)  BMI 32.08 kg/m2       Objective:   Physical Exam  Constitutional: She appears well-developed and well-nourished.  HENT:  Head: Normocephalic and atraumatic.  Mouth/Throat: Uvula is midline, oropharynx is clear and moist and mucous membranes are normal.       Left TM + erythema most notable at 12 oclock, no bulging.  R TM normal, canals are normal   Eyes: Conjunctivae are normal. Pupils are equal, round, and reactive to light. Right eye exhibits no discharge.  Neck: Neck supple. No thyromegaly present.          Assessment & Plan:   No problem-specific assessment & plan notes found for this encounter.

## 2011-07-31 ENCOUNTER — Other Ambulatory Visit: Payer: Self-pay | Admitting: *Deleted

## 2011-07-31 MED ORDER — NAFTIFINE HCL 1 % EX CREA: TOPICAL_CREAM | Freq: Every day | CUTANEOUS | Status: AC

## 2011-07-31 MED ORDER — ESTROPIPATE 0.75 MG PO TABS: 0.7500 mg | ORAL_TABLET | Freq: Every day | ORAL | Status: DC

## 2011-07-31 NOTE — Telephone Encounter (Signed)
Ok to refill 

## 2011-07-31 NOTE — Telephone Encounter (Signed)
Faxed to [facility] per patient  request       KP

## 2011-07-31 NOTE — Telephone Encounter (Signed)
Last filled 12-26-10 #30g 1, last oc 06-16-11. Pt notes that she does not have yeast infection but like to keep med on hand because whenever antibiotic is Rx she get yeast infection..Please advise

## 2011-07-31 NOTE — Telephone Encounter (Signed)
Pt called to report that wrong strength of med was sent to pharmacy. Pt aware Rx re-sent to pharmacy.

## 2011-10-09 ENCOUNTER — Other Ambulatory Visit: Payer: Self-pay | Admitting: Family Medicine

## 2011-10-09 MED ORDER — AMLODIPINE BESYLATE 5 MG PO TABS: 5.0000 mg | ORAL_TABLET | Freq: Every day | ORAL | Status: DC

## 2011-10-09 NOTE — Telephone Encounter (Signed)
Last OV 03-16-11

## 2011-10-09 NOTE — Telephone Encounter (Signed)
Spoke to pt to advise new medication. Pt understood and sent via e script to [facility] per pt request

## 2011-10-09 NOTE — Telephone Encounter (Signed)
Please verify no allergy to amlodipine 5 mg one daily dispense 30

## 2011-10-26 ENCOUNTER — Telehealth: Payer: Self-pay | Admitting: Family Medicine

## 2011-10-26 MED ORDER — AMLODIPINE BESYLATE 5 MG PO TABS: 5.0000 mg | ORAL_TABLET | Freq: Every day | ORAL | Status: DC

## 2011-10-26 NOTE — Telephone Encounter (Signed)
Patient needs refill norvasc (generic) [facility] - 90 day supply

## 2011-10-26 NOTE — Telephone Encounter (Signed)
Faxed.   KP 

## 2011-12-22 DIAGNOSIS — D239 Other benign neoplasm of skin, unspecified: Secondary | ICD-10-CM | POA: Diagnosis not present

## 2011-12-24 ENCOUNTER — Ambulatory Visit (INDEPENDENT_AMBULATORY_CARE_PROVIDER_SITE_OTHER): Payer: Medicare Other | Admitting: Family Medicine

## 2011-12-24 ENCOUNTER — Encounter: Payer: Self-pay | Admitting: Family Medicine

## 2011-12-24 DIAGNOSIS — R059 Cough, unspecified: Secondary | ICD-10-CM | POA: Diagnosis not present

## 2011-12-24 DIAGNOSIS — J329 Chronic sinusitis, unspecified: Secondary | ICD-10-CM

## 2011-12-24 DIAGNOSIS — R05 Cough: Secondary | ICD-10-CM | POA: Diagnosis not present

## 2011-12-24 MED ORDER — DOXYCYCLINE HYCLATE 100 MG PO TABS: 100.0000 mg | ORAL_TABLET | Freq: Two times a day (BID) | ORAL | Status: DC

## 2011-12-24 MED ORDER — HYDROCODONE-HOMATROPINE 5-1.5 MG/5ML PO SYRP: ORAL_SOLUTION | ORAL | Status: DC

## 2011-12-24 NOTE — Patient Instructions (Signed)

## 2011-12-24 NOTE — Progress Notes (Signed)
  Subjective:     Wendy Velazquez is a 71 y.o. female who presents for evaluation of sinus pain. Symptoms include: congestion, cough, facial pain, fevers, nasal congestion, post nasal drip and sinus pressure. Onset of symptoms was 3 days ago. Symptoms have been gradually worsening since that time. Past history is significant for no history of pneumonia or bronchitis. Patient is a non-smoker.  The following portions of the patient's history were reviewed and updated as appropriate: allergies, current medications, past family history, past medical history, past social history, past surgical history and problem list.  Review of Systems Pertinent items are noted in HPI.   Objective:    BP 136/86  Pulse 80  Temp(Src) 98.6 F (37 C) (Oral)  Wt 182 lb (82.555 kg)  SpO2 97% General appearance: alert, cooperative, appears stated age and mild distress Nose: green discharge, mild congestion, turbinates red, swollen, edematous, sinus tenderness bilateral Throat: abnormal findings: mild oropharyngeal erythema Neck: mild anterior cervical adenopathy and thyroid not enlarged, symmetric, no tenderness/mass/nodules Lungs: clear to auscultation bilaterally Heart: S1, S2 normal Extremities: extremities normal, atraumatic, no cyanosis or edema    Assessment:    Acute bacterial sinusitis.    Plan:    Nasal saline sprays. doxycycline per medication orders. Follow up in 5 days or as needed.

## 2011-12-28 ENCOUNTER — Encounter: Payer: Self-pay | Admitting: Family Medicine

## 2011-12-28 ENCOUNTER — Ambulatory Visit (INDEPENDENT_AMBULATORY_CARE_PROVIDER_SITE_OTHER): Payer: Medicare Other | Admitting: Family Medicine

## 2011-12-28 VITALS — BP 128/76 | HR 74 | Temp 98.4°F | Wt 182.4 lb

## 2011-12-28 DIAGNOSIS — J329 Chronic sinusitis, unspecified: Secondary | ICD-10-CM | POA: Diagnosis not present

## 2011-12-28 DIAGNOSIS — J40 Bronchitis, not specified as acute or chronic: Secondary | ICD-10-CM

## 2011-12-28 MED ORDER — MOMETASONE FUROATE 50 MCG/ACT NA SUSP: 2.0000 | Freq: Every day | NASAL | Status: DC

## 2011-12-28 MED ORDER — METHYLPREDNISOLONE ACETATE 80 MG/ML IJ SUSP
80.0000 mg | Freq: Once | INTRAMUSCULAR | Status: AC
  Administered 2011-12-28: 80 mg via INTRAMUSCULAR

## 2011-12-28 MED ORDER — MOXIFLOXACIN HCL 400 MG PO TABS: 400.0000 mg | ORAL_TABLET | Freq: Every day | ORAL | Status: AC

## 2011-12-28 MED ORDER — PREDNISONE 10 MG PO TABS: ORAL_TABLET | ORAL | Status: DC

## 2011-12-28 NOTE — Patient Instructions (Signed)

## 2011-12-28 NOTE — Progress Notes (Signed)
  Subjective:     Wendy Velazquez is a 71 y.o. female who presents for evaluation of sinus pain. Symptoms include: congestion, cough, facial pain, nasal congestion and sinus pressure. Onset of symptoms was several weeks ago. Symptoms have been gradually worsening since that time. Past history is significant for no history of pneumonia or bronchitis. Patient is a non-smoker.  The following portions of the patient's history were reviewed and updated as appropriate: allergies, current medications, past family history, past medical history, past social history, past surgical history and problem list.  Review of Systems Pertinent items are noted in HPI.   Objective:    BP 128/76  Pulse 74  Temp(Src) 98.4 F (36.9 C) (Oral)  Wt 182 lb 6.4 oz (82.736 kg)  SpO2 97% General appearance: alert, cooperative, appears stated age and no distress Ears: normal TM's and external ear canals both ears Nose: green discharge, moderate congestion, turbinates red, swollen, edematous, sinus tenderness bilateral Throat: lips, mucosa, and tongue normal; teeth and gums normal Neck: mild anterior cervical adenopathy, no carotid bruit, no JVD, supple, symmetrical, trachea midline and thyroid not enlarged, symmetric, no tenderness/mass/nodules Lungs: clear to auscultation bilaterally Heart: S1, S2 normal Extremities: extremities normal, atraumatic, no cyanosis or edema    Assessment:    Acute bacterial sinusitis.   bronchitis  Plan:    Nasal steroids per medication orders. Antihistamines per medication orders. avelox per medication orders.  pred taper  depo medrol

## 2012-01-05 ENCOUNTER — Telehealth: Payer: Self-pay | Admitting: Family Medicine

## 2012-01-05 NOTE — Telephone Encounter (Signed)
[facility] Triage Call Report Triage Record Num: [id number] Operator: Aundra Millet Patient Name: Wendy Velazquez Call Date & Time: 01/04/2012 4:30:31PM Patient Phone: [phone] PCP: Lelon Perla Patient Gender: Female PCP Fax : [phone] Patient DOB: [birth year] Practice Name: [facility] Reason for Call: Caller: Wendy Velazquez/Patient; PCP: Lelon Perla.; CB#: [phone]; ; ; Call regarding Cough/Congestion; Dx bronchitis 12/28/2011 by Dr Laury Axon and prescribed Avelox x 7 days - finishing 01/04/2012 -- Taking Prednisone x 10 days and has 3 days left. Cough is much , better and not keeping her up at night like it was. Only sees yellow sputum with cough at times - not green anymore and she wants to make sure thats ok. RN adv that sx's have improved and continue course of treatment and call back if sxs worsen Protocol(s) Used: Office Note Recommended Outcome per Protocol: Information Noted and Sent to Office Reason for Outcome: Caller information to office Care Advice:

## 2012-01-05 NOTE — Telephone Encounter (Signed)
Sending to MD for review and any other recommendations.    KP

## 2012-01-09 ENCOUNTER — Other Ambulatory Visit: Payer: Self-pay | Admitting: Family Medicine

## 2012-01-21 DIAGNOSIS — L821 Other seborrheic keratosis: Secondary | ICD-10-CM | POA: Diagnosis not present

## 2012-01-21 DIAGNOSIS — D1801 Hemangioma of skin and subcutaneous tissue: Secondary | ICD-10-CM | POA: Diagnosis not present

## 2012-01-21 DIAGNOSIS — D239 Other benign neoplasm of skin, unspecified: Secondary | ICD-10-CM | POA: Diagnosis not present

## 2012-01-21 DIAGNOSIS — Z85820 Personal history of malignant melanoma of skin: Secondary | ICD-10-CM | POA: Diagnosis not present

## 2012-03-02 ENCOUNTER — Ambulatory Visit (INDEPENDENT_AMBULATORY_CARE_PROVIDER_SITE_OTHER): Payer: No Typology Code available for payment source | Admitting: Internal Medicine

## 2012-03-02 VITALS — BP 138/88 | HR 64 | Temp 97.9°F | Wt 182.0 lb

## 2012-03-02 DIAGNOSIS — J069 Acute upper respiratory infection, unspecified: Secondary | ICD-10-CM

## 2012-03-02 MED ORDER — AZELASTINE HCL 0.1 % NA SOLN: 2.0000 | Freq: Two times a day (BID) | NASAL | Status: DC

## 2012-03-02 NOTE — Assessment & Plan Note (Signed)
Patient has a URI, already took some antibiotics. Will have add astelin and  consider antibiotics if not better in a few days.

## 2012-03-02 NOTE — Patient Instructions (Addendum)
Rest, fluids , tylenol For cough, take Mucinex  twice a day as needed  Hycodan if the cough continue For congestion use astelin nasal spray twice a day until you feel better (keep taking your regular nose sprays ) Call if no better in few days Call anytime if the symptoms are severe  ----------------------- You are due for a regular checkup with your primary doctor, please schedule at your earliest convenience

## 2012-03-02 NOTE — Progress Notes (Signed)
  Subjective:    Patient ID: Wendy Velazquez, female    DOB: 10-01-1941, 71 y.o.   MRN: [id number]  HPI Acute visit 6 days history of sore throat, cough with occasional production of sputum. Sputum was initially green and now is clear. She also had some sinus congestion and postnasal dripping. With the onset of her symptoms, she took a leftover doxycycline for a few days.    Past Medical History  Diagnosis Date  . Hypertension   . Arthritis   . MRSA (methicillin resistant staph aureus) culture positive   . Rapid heart beat     benigh  . Osteopenia   . Transitional cell carcinoma   . Melanoma     Past Surgical History  Procedure Date  . Tonsillectomy   . Abdominal hysterectomy 1987    partial  . Melanoma excision 1977    left leg  . Melanoma excision 06-24-09    r foot  . Cataract extraction 2004  . Skin cancer excision 1999    bladder transitional cell  . Eye surgery     cataract b/l  . Teeth implants     Review of Systems No nausea, vomiting, diarrhea. Mild discomfort behind the left ear. Subjective fever, no chills    Objective:   Physical Exam  General -- alert, well-developed, and well-nourished. NAD  HEENT -- TMs slt bulge but no red, no d/c; throat w/o redness, face symmetric and tender to palpation at both maxillary sinuses, EOMI Lungs -- normal respiratory effort, no intercostal retractions, no accessory muscle use, and normal breath sounds.   Heart-- normal rate, regular rhythm, no murmur, and no gallop.   Extremities-- no pretibial edema bilaterally     Assessment & Plan:

## 2012-03-04 ENCOUNTER — Encounter: Payer: Self-pay | Admitting: Internal Medicine

## 2012-03-17 ENCOUNTER — Other Ambulatory Visit: Payer: Self-pay | Admitting: Dermatology

## 2012-03-17 DIAGNOSIS — D485 Neoplasm of uncertain behavior of skin: Secondary | ICD-10-CM | POA: Diagnosis not present

## 2012-03-17 DIAGNOSIS — L988 Other specified disorders of the skin and subcutaneous tissue: Secondary | ICD-10-CM | POA: Diagnosis not present

## 2012-03-22 ENCOUNTER — Ambulatory Visit (INDEPENDENT_AMBULATORY_CARE_PROVIDER_SITE_OTHER): Payer: Medicare Other | Admitting: Family Medicine

## 2012-03-22 ENCOUNTER — Encounter: Payer: Self-pay | Admitting: Family Medicine

## 2012-03-22 VITALS — BP 118/74 | HR 65 | Temp 98.5°F | Ht 63.0 in | Wt 182.4 lb

## 2012-03-22 DIAGNOSIS — Z0000 Encounter for general adult medical examination without abnormal findings: Secondary | ICD-10-CM

## 2012-03-22 DIAGNOSIS — R319 Hematuria, unspecified: Secondary | ICD-10-CM

## 2012-03-22 DIAGNOSIS — Z780 Asymptomatic menopausal state: Secondary | ICD-10-CM

## 2012-03-22 DIAGNOSIS — I10 Essential (primary) hypertension: Secondary | ICD-10-CM | POA: Diagnosis not present

## 2012-03-22 DIAGNOSIS — E039 Hypothyroidism, unspecified: Secondary | ICD-10-CM | POA: Diagnosis not present

## 2012-03-22 DIAGNOSIS — J309 Allergic rhinitis, unspecified: Secondary | ICD-10-CM | POA: Diagnosis not present

## 2012-03-22 DIAGNOSIS — M129 Arthropathy, unspecified: Secondary | ICD-10-CM

## 2012-03-22 DIAGNOSIS — J302 Other seasonal allergic rhinitis: Secondary | ICD-10-CM

## 2012-03-22 DIAGNOSIS — M1990 Unspecified osteoarthritis, unspecified site: Secondary | ICD-10-CM

## 2012-03-22 LAB — POCT URINALYSIS DIPSTICK
Bilirubin, UA: NEGATIVE
Glucose, UA: NEGATIVE
Spec Grav, UA: 1.015
pH, UA: 6.5

## 2012-03-22 LAB — BASIC METABOLIC PANEL
BUN: 24 mg/dL — ABNORMAL HIGH (ref 6–23)
CO2: 29 mEq/L (ref 19–32)
Chloride: 103 mEq/L (ref 96–112)
Creatinine, Ser: 1.1 mg/dL (ref 0.4–1.2)
Glucose, Bld: 93 mg/dL (ref 70–99)
Potassium: 3.7 mEq/L (ref 3.5–5.1)

## 2012-03-22 LAB — CBC WITH DIFFERENTIAL/PLATELET
Basophils Relative: 0.5 % (ref 0.0–3.0)
Eosinophils Absolute: 0.1 10*3/uL (ref 0.0–0.7)
Eosinophils Relative: 1.8 % (ref 0.0–5.0)
HCT: 42.2 % (ref 36.0–46.0)
Lymphs Abs: 1.9 10*3/uL (ref 0.7–4.0)
MCHC: 34 g/dL (ref 30.0–36.0)
MCV: 92.7 fl (ref 78.0–100.0)
Monocytes Absolute: 0.5 10*3/uL (ref 0.1–1.0)
Neutrophils Relative %: 65.3 % (ref 43.0–77.0)
Platelets: 212 10*3/uL (ref 150.0–400.0)
WBC: 7.4 10*3/uL (ref 4.5–10.5)

## 2012-03-22 LAB — LIPID PANEL
Cholesterol: 179 mg/dL (ref 0–200)
HDL: 51.1 mg/dL (ref >39.00)
Triglycerides: 165 mg/dL — ABNORMAL HIGH (ref 0.0–149.0)

## 2012-03-22 LAB — HEPATIC FUNCTION PANEL
ALT: 18 U/L (ref 0–35)
Bilirubin, Direct: 0.1 mg/dL (ref 0.0–0.3)
Total Bilirubin: 0.8 mg/dL (ref 0.3–1.2)
Total Protein: 6.9 g/dL (ref 6.0–8.3)

## 2012-03-22 MED ORDER — AZELASTINE HCL 0.1 % NA SOLN: 2.0000 | Freq: Two times a day (BID) | NASAL | Status: DC

## 2012-03-22 MED ORDER — SYNTHROID 75 MCG PO TABS: 75.0000 ug | ORAL_TABLET | Freq: Every day | ORAL | Status: DC

## 2012-03-22 MED ORDER — MELOXICAM 15 MG PO TABS: ORAL_TABLET | ORAL | Status: DC

## 2012-03-22 MED ORDER — SPIRONOLACTONE 25 MG PO TABS: ORAL_TABLET | ORAL | Status: DC

## 2012-03-22 MED ORDER — ESTROPIPATE 0.75 MG PO TABS: 0.7500 mg | ORAL_TABLET | Freq: Every day | ORAL | Status: DC

## 2012-03-22 MED ORDER — MEDROXYPROGESTERONE ACETATE 5 MG PO TABS: 5.0000 mg | ORAL_TABLET | Freq: Every day | ORAL | Status: DC

## 2012-03-22 MED ORDER — AMLODIPINE BESYLATE 5 MG PO TABS: 5.0000 mg | ORAL_TABLET | Freq: Every day | ORAL | Status: DC

## 2012-03-22 MED ORDER — PROPRANOLOL HCL 10 MG PO TABS: 10.0000 mg | ORAL_TABLET | Freq: Two times a day (BID) | ORAL | Status: DC

## 2012-03-22 NOTE — Assessment & Plan Note (Signed)
Stable Cont med

## 2012-03-22 NOTE — Progress Notes (Signed)
Subjective:    Wendy Velazquez is a 71 y.o. female who presents for Medicare Annual/Subsequent preventive examination.  Preventive Screening-Counseling & Management  Tobacco History  Smoking status  . Former Smoker -- 0.3 packs/day for 15 years  . Quit date: 03/15/1981  Smokeless tobacco  . Never Used     Problems Prior to Visit 1.   Current Problems (verified) Patient Active Problem List  Diagnoses  . MRSA INFECTION  . MELANOMA, FOOT, RIGHT  . NEOPLASM, MALIGNANT, BLADDER, TRANSITIONAL CELL  . HYPOTHYROIDISM  . OTHER ACUTE REACTIONS TO STRESS  . HEARING LOSS, BILATERAL  . HYPERTENSION  . Acute Sinusitis, Unspecified  . URI  . POSTMENOPAUSAL STATUS  . PARONYCHIA, FINGER  . ABSCESS, SKIN  . DERMATITIS, CONTACT, NEC  . MELANOMA, LEG, HX OF  . CANDIDIASIS OF VULVA AND VAGINA  . Left otitis media    Medications Prior to Visit Current Outpatient Prescriptions on File Prior to Visit  Medication Sig Dispense Refill  . acetaminophen (TYLENOL ARTHRITIS PAIN) 650 MG CR tablet Take 650 mg by mouth at bedtime.        Marland Kitchen amLODipine (NORVASC) 5 MG tablet TAKE 1 TABLET DAILY (OFFICE VISIT DUE NOW)  90 tablet  0  . estropipate (OGEN) 0.75 MG tablet Take 1 tablet (0.75 mg total) by mouth daily.  90 tablet  0  . fluconazole (DIFLUCAN) 150 MG tablet One tablet by mouth as needed for vaginal itching/discharge.  May repeat in 3 days as needed  2 tablet  0  . guaiFENesin (MUCINEX) 600 MG 12 hr tablet Take 600 mg by mouth at bedtime.        Marland Kitchen HYDROcodone-homatropine (HYCODAN) 5-1.5 MG/5ML syrup 1 tsp po qhs prn  120 mL  0  . medroxyPROGESTERone (PROVERA) 5 MG tablet 1/2 tab po qd 10 days a month as directed.  Brand name necessary.       . meloxicam (MOBIC) 15 MG tablet 1/2-1 by mouth once daily  90 tablet  3  . Misc Natural Product Nasal (SIMILASAN NASAL SPRAY) SOLN Place 1 spray into both nostrils at bedtime.        . mometasone (NASONEX) 50 MCG/ACT nasal spray Place 2 sprays into the  nose daily.  17 g  12  . naftifine (NAFTIN) 1 % cream Apply topically daily.  30 g  0  . propranolol (INDERAL) 10 MG tablet Take 1 tablet (10 mg total) by mouth 2 (two) times daily.  180 tablet  3  . ranitidine (ZANTAC 75) 75 MG tablet Take 75 mg by mouth daily.        Marland Kitchen spironolactone (ALDACTONE) 25 MG tablet 1/2 tab po qd  135 tablet  3  . SYNTHROID 75 MCG tablet Take 1 tablet (75 mcg total) by mouth daily. Brand name necessary  90 tablet  3  . azelastine (ASTELIN) 137 MCG/SPRAY nasal spray Place 2 sprays into the nose 2 (two) times daily. Use in each nostril as directed  30 mL  1    Current Medications (verified) Current Outpatient Prescriptions  Medication Sig Dispense Refill  . acetaminophen (TYLENOL ARTHRITIS PAIN) 650 MG CR tablet Take 650 mg by mouth at bedtime.        Marland Kitchen amLODipine (NORVASC) 5 MG tablet TAKE 1 TABLET DAILY (OFFICE VISIT DUE NOW)  90 tablet  0  . estropipate (OGEN) 0.75 MG tablet Take 1 tablet (0.75 mg total) by mouth daily.  90 tablet  0  . fluconazole (DIFLUCAN) 150 MG  tablet One tablet by mouth as needed for vaginal itching/discharge.  May repeat in 3 days as needed  2 tablet  0  . guaiFENesin (MUCINEX) 600 MG 12 hr tablet Take 600 mg by mouth at bedtime.        Marland Kitchen HYDROcodone-homatropine (HYCODAN) 5-1.5 MG/5ML syrup 1 tsp po qhs prn  120 mL  0  . medroxyPROGESTERone (PROVERA) 5 MG tablet 1/2 tab po qd 10 days a month as directed.  Brand name necessary.       . meloxicam (MOBIC) 15 MG tablet 1/2-1 by mouth once daily  90 tablet  3  . Misc Natural Product Nasal (SIMILASAN NASAL SPRAY) SOLN Place 1 spray into both nostrils at bedtime.        . mometasone (NASONEX) 50 MCG/ACT nasal spray Place 2 sprays into the nose daily.  17 g  12  . naftifine (NAFTIN) 1 % cream Apply topically daily.  30 g  0  . propranolol (INDERAL) 10 MG tablet Take 1 tablet (10 mg total) by mouth 2 (two) times daily.  180 tablet  3  . ranitidine (ZANTAC 75) 75 MG tablet Take 75 mg by mouth daily.         Marland Kitchen spironolactone (ALDACTONE) 25 MG tablet 1/2 tab po qd  135 tablet  3  . SYNTHROID 75 MCG tablet Take 1 tablet (75 mcg total) by mouth daily. Brand name necessary  90 tablet  3  . azelastine (ASTELIN) 137 MCG/SPRAY nasal spray Place 2 sprays into the nose 2 (two) times daily. Use in each nostril as directed  30 mL  1     Allergies (verified) Codeine   PAST HISTORY  Family History Family History  Problem Relation Age of Onset  . Diabetes Mother   . Alzheimer's disease Mother   . Dementia Mother   . Stroke Father   . Hypertension Father     Social History History  Substance Use Topics  . Smoking status: Former Smoker -- 0.3 packs/day for 15 years    Quit date: 03/15/1981  . Smokeless tobacco: Never Used  . Alcohol Use: Yes     Are there smokers in your home (other than you)? No  Risk Factors Current exercise habits: dancing  Dietary issues discussed: NA   Cardiac risk factors: advanced age (older than 63 for men, 74 for women), dyslipidemia and hypertension.  Depression Screen (Note: if answer to either of the following is "Yes", a more complete depression screening is indicated)   Over the past two weeks, have you felt down, depressed or hopeless? No  Over the past two weeks, have you felt little interest or pleasure in doing things? No  Have you lost interest or pleasure in daily life? No  Do you often feel hopeless? No  Do you cry easily over simple problems? No  Activities of Daily Living In your present state of health, do you have any difficulty performing the following activities?:  Driving? No Managing money?  No Feeding yourself? No Getting from bed to chair? No Climbing a flight of stairs? No Preparing food and eating?: No Bathing or showering? No Getting dressed: No Getting to the toilet? No Using the toilet:No Moving around from place to place: No In the past year have you fallen or had a near fall?:No   Are you sexually active?  No  Do  you have more than one partner?  No  Hearing Difficulties: Yes--- b/l hearing aids Do you often ask people to  speak up or repeat themselves? Yes Do you experience ringing or noises in your ears? Yes Do you have difficulty understanding soft or whispered voices? Yes   Do you feel that you have a problem with memory? No  Do you often misplace items? No  Do you feel safe at home?  Yes  Cognitive Testing  Alert? Yes  Normal Appearance?Yes  Oriented to person? Yes  Place? Yes   Time? Yes  Recall of three objects?  Yes  Can perform simple calculations? Yes  Displays appropriate judgment?Yes  Can read the correct time from a watch face?Yes   Advanced Directives have been discussed with the patient? Yes  List the Names of Other Physician/Practitioners you currently use: 1.  Derm---Lomax 2.  Eye-- Sharlot Gowda 3.  Dentist--Dr Elinor Parkinson any recent Medical Services you may have received from other than [facility] providers in the past year (date may be approximate).  Immunization History  Administered Date(s) Administered  . Influenza Whole 09/12/2008  . Pneumococcal Polysaccharide 03/02/2008  . Td 11/29/2002  . Zoster 03/02/2008    Screening Tests Health Maintenance  Topic Date Due  . Influenza Vaccine  08/16/2012  . Tetanus/tdap  11/29/2012  . Mammogram  03/25/2013  . Colonoscopy  06/01/2017  . Pneumococcal Polysaccharide Vaccine Age 60 And Over  Completed  . Zostavax  Completed    All answers were reviewed with the patient and necessary referrals were made:  Loreen Freud, DO   03/22/2012   History reviewed: allergies, current medications, past family history, past medical history, past social history, past surgical history and problem list  Review of Systems  Review of Systems  Constitutional: Negative for activity change, appetite change and fatigue.  HENT: Negative for hearing loss, congestion, tinnitus and ear discharge.   Eyes: Negative for visual disturbance (see optho q1y  -- vision corrected to 20/20 with glasses).  Respiratory: Negative for cough, chest tightness and shortness of breath.   Cardiovascular: Negative for chest pain, palpitations and leg swelling.  Gastrointestinal: Negative for abdominal pain, diarrhea, constipation and abdominal distention.  Genitourinary: Negative for urgency, frequency, decreased urine volume and difficulty urinating.  Musculoskeletal: Negative for back pain, arthralgias and gait problem.  Skin: Negative for color change, pallor and rash.  Neurological: Negative for dizziness, light-headedness, numbness and headaches.  Hematological: Negative for adenopathy. Does not bruise/bleed easily.  Psychiatric/Behavioral: Negative for suicidal ideas, confusion, sleep disturbance, self-injury, dysphoric mood, decreased concentration and agitation.  Pt is able to read and write and can do all ADLs No risk for falling No abuse/ violence in home     Objective:     Vision by Snellen chart: opth  Body mass index is 32.31 kg/(m^2). BP 118/74  Pulse 65  Temp(Src) 98.5 F (36.9 C) (Oral)  Ht 5\' 3"  (1.6 m)  Wt 182 lb 6.4 oz (82.736 kg)  BMI 32.31 kg/m2  SpO2 97%  BP 118/74  Pulse 65  Temp(Src) 98.5 F (36.9 C) (Oral)  Ht 5\' 3"  (1.6 m)  Wt 182 lb 6.4 oz (82.736 kg)  BMI 32.31 kg/m2  SpO2 97% General appearance: alert, cooperative, appears stated age and no distress Head: Normocephalic, without obvious abnormality, atraumatic Eyes: conjunctivae/corneas clear. PERRL, EOM's intact. Fundi benign. Ears: normal TM's and external ear canals both ears Nose: Nares normal. Septum midline. Mucosa normal. No drainage or sinus tenderness. Throat: lips, mucosa, and tongue normal; teeth and gums normal Neck: no adenopathy, no carotid bruit, no JVD, supple, symmetrical, trachea midline and thyroid not  enlarged, symmetric, no tenderness/mass/nodules Back: symmetric, no curvature. ROM normal. No CVA tenderness. Lungs: clear to auscultation  bilaterally Breasts: normal appearance, no masses or tenderness Heart: regular rate and rhythm, S1, S2 normal, no murmur, click, rub or gallop Abdomen: soft, non-tender; bowel sounds normal; no masses,  no organomegaly Pelvic: pap q2y Extremities: extremities normal, atraumatic, no cyanosis or edema Pulses: 2+ and symmetric Skin: Skin color, texture, turgor normal. No rashes or lesions Lymph nodes: Cervical, supraclavicular, and axillary nodes normal. Neurologic: Alert and oriented X 3, normal strength and tone. Normal symmetric reflexes. Normal coordination and gait Psych-- no depression, anxiety     Assessment:     cpe     Plan:     During the course of the visit the patient was educated and counseled about appropriate screening and preventive services including:    Pneumococcal vaccine   Influenza vaccine  Td vaccine  Screening mammography  Screening Pap smear and pelvic exam   Bone densitometry screening  Colorectal cancer screening  Advanced directives: has an advanced directive - a copy HAS NOT been provided.  Diet review for nutrition referral? Yes ____  Not Indicated ____   Patient Instructions (the written plan) was given to the patient.  Medicare Attestation I have personally reviewed: The patient's medical and social history Their use of alcohol, tobacco or illicit drugs Their current medications and supplements The patient's functional ability including ADLs,fall risks, home safety risks, cognitive, and hearing and visual impairment Diet and physical activities Evidence for depression or mood disorders  The patient's weight, height, BMI, and visual acuity have been recorded in the chart.  I have made referrals, counseling, and provided education to the patient based on review of the above and I have provided the patient with a written personalized care plan for preventive services.     Loreen Freud, DO   03/22/2012

## 2012-03-22 NOTE — Progress Notes (Signed)
Addended by: Silvio Pate D on: 03/22/2012 10:45 AM   Modules accepted: Orders

## 2012-03-22 NOTE — Patient Instructions (Signed)
Preventive Care for Adults, Female A healthy lifestyle and preventive care can promote health and wellness. Preventive health guidelines for women include the following key practices.  A routine yearly physical is a good way to check with your caregiver about your health and preventive screening. It is a chance to share any concerns and updates on your health, and to receive a thorough exam.   Visit your dentist for a routine exam and preventive care every 6 months. Brush your teeth twice a day and floss once a day. Good oral hygiene prevents tooth decay and gum disease.   The frequency of eye exams is based on your age, health, family medical history, use of contact lenses, and other factors. Follow your caregiver's recommendations for frequency of eye exams.   Eat a healthy diet. Foods like vegetables, fruits, whole grains, low-fat dairy products, and lean protein foods contain the nutrients you need without too many calories. Decrease your intake of foods high in solid fats, added sugars, and salt. Eat the right amount of calories for you.Get information about a proper diet from your caregiver, if necessary.   Regular physical exercise is one of the most important things you can do for your health. Most adults should get at least 150 minutes of moderate-intensity exercise (any activity that increases your heart rate and causes you to sweat) each week. In addition, most adults need muscle-strengthening exercises on 2 or more days a week.   Maintain a healthy weight. The body mass index (BMI) is a screening tool to identify possible weight problems. It provides an estimate of body fat based on height and weight. Your caregiver can help determine your BMI, and can help you achieve or maintain a healthy weight.For adults 20 years and older:   A BMI below 18.5 is considered underweight.   A BMI of 18.5 to 24.9 is normal.   A BMI of 25 to 29.9 is considered overweight.   A BMI of 30 and above is  considered obese.   Maintain normal blood lipids and cholesterol levels by exercising and minimizing your intake of saturated fat. Eat a balanced diet with plenty of fruit and vegetables. Blood tests for lipids and cholesterol should begin at age 20 and be repeated every 5 years. If your lipid or cholesterol levels are high, you are over 50, or you are at high risk for heart disease, you may need your cholesterol levels checked more frequently.Ongoing high lipid and cholesterol levels should be treated with medicines if diet and exercise are not effective.   If you smoke, find out from your caregiver how to quit. If you do not use tobacco, do not start.   If you are pregnant, do not drink alcohol. If you are breastfeeding, be very cautious about drinking alcohol. If you are not pregnant and choose to drink alcohol, do not exceed 1 drink per day. One drink is considered to be 12 ounces (355 mL) of beer, 5 ounces (148 mL) of wine, or 1.5 ounces (44 mL) of liquor.   Avoid use of street drugs. Do not share needles with anyone. Ask for help if you need support or instructions about stopping the use of drugs.   High blood pressure causes heart disease and increases the risk of stroke. Your blood pressure should be checked at least every 1 to 2 years. Ongoing high blood pressure should be treated with medicines if weight loss and exercise are not effective.   If you are 55 to 70   years old, ask your caregiver if you should take aspirin to prevent strokes.   Diabetes screening involves taking a blood sample to check your fasting blood sugar level. This should be done once every 3 years, after age 45, if you are within normal weight and without risk factors for diabetes. Testing should be considered at a younger age or be carried out more frequently if you are overweight and have at least 1 risk factor for diabetes.   Breast cancer screening is essential preventive care for women. You should practice "breast  self-awareness." This means understanding the normal appearance and feel of your breasts and may include breast self-examination. Any changes detected, no matter how small, should be reported to a caregiver. Women in their 20s and 30s should have a clinical breast exam (CBE) by a caregiver as part of a regular health exam every 1 to 3 years. After age 40, women should have a CBE every year. Starting at age 40, women should consider having a mammography (breast X-ray test) every year. Women who have a family history of breast cancer should talk to their caregiver about genetic screening. Women at a high risk of breast cancer should talk to their caregivers about having magnetic resonance imaging (MRI) and a mammography every year.   The Pap test is a screening test for cervical cancer. A Pap test can show cell changes on the cervix that might become cervical cancer if left untreated. A Pap test is a procedure in which cells are obtained and examined from the lower end of the uterus (cervix).   Women should have a Pap test starting at age 21.   Between ages 21 and 29, Pap tests should be repeated every 2 years.   Beginning at age 30, you should have a Pap test every 3 years as long as the past 3 Pap tests have been normal.   Some women have medical problems that increase the chance of getting cervical cancer. Talk to your caregiver about these problems. It is especially important to talk to your caregiver if a new problem develops soon after your last Pap test. In these cases, your caregiver may recommend more frequent screening and Pap tests.   The above recommendations are the same for women who have or have not gotten the vaccine for human papillomavirus (HPV).   If you had a hysterectomy for a problem that was not cancer or a condition that could lead to cancer, then you no longer need Pap tests. Even if you no longer need a Pap test, a regular exam is a good idea to make sure no other problems are  starting.   If you are between ages 65 and 70, and you have had normal Pap tests going back 10 years, you no longer need Pap tests. Even if you no longer need a Pap test, a regular exam is a good idea to make sure no other problems are starting.   If you have had past treatment for cervical cancer or a condition that could lead to cancer, you need Pap tests and screening for cancer for at least 20 years after your treatment.   If Pap tests have been discontinued, risk factors (such as a new sexual partner) need to be reassessed to determine if screening should be resumed.   The HPV test is an additional test that may be used for cervical cancer screening. The HPV test looks for the virus that can cause the cell changes on the cervix.   The cells collected during the Pap test can be tested for HPV. The HPV test could be used to screen women aged 30 years and older, and should be used in women of any age who have unclear Pap test results. After the age of 30, women should have HPV testing at the same frequency as a Pap test.   Colorectal cancer can be detected and often prevented. Most routine colorectal cancer screening begins at the age of 50 and continues through age 75. However, your caregiver may recommend screening at an earlier age if you have risk factors for colon cancer. On a yearly basis, your caregiver may provide home test kits to check for hidden blood in the stool. Use of a small camera at the end of a tube, to directly examine the colon (sigmoidoscopy or colonoscopy), can detect the earliest forms of colorectal cancer. Talk to your caregiver about this at age 50, when routine screening begins. Direct examination of the colon should be repeated every 5 to 10 years through age 75, unless early forms of pre-cancerous polyps or small growths are found.   Hepatitis C blood testing is recommended for all people born from 1945 through 1965 and any individual with known risks for hepatitis C.    Practice safe sex. Use condoms and avoid high-risk sexual practices to reduce the spread of sexually transmitted infections (STIs). STIs include gonorrhea, chlamydia, syphilis, trichomonas, herpes, HPV, and human immunodeficiency virus (HIV). Herpes, HIV, and HPV are viral illnesses that have no cure. They can result in disability, cancer, and death. Sexually active women aged 25 and younger should be checked for chlamydia. Older women with new or multiple partners should also be tested for chlamydia. Testing for other STIs is recommended if you are sexually active and at increased risk.   Osteoporosis is a disease in which the bones lose minerals and strength with aging. This can result in serious bone fractures. The risk of osteoporosis can be identified using a bone density scan. Women ages 65 and over and women at risk for fractures or osteoporosis should discuss screening with their caregivers. Ask your caregiver whether you should take a calcium supplement or vitamin D to reduce the rate of osteoporosis.   Menopause can be associated with physical symptoms and risks. Hormone replacement therapy is available to decrease symptoms and risks. You should talk to your caregiver about whether hormone replacement therapy is right for you.   Use sunscreen with sun protection factor (SPF) of 30 or more. Apply sunscreen liberally and repeatedly throughout the day. You should seek shade when your shadow is shorter than you. Protect yourself by wearing long sleeves, pants, a wide-brimmed hat, and sunglasses year round, whenever you are outdoors.   Once a month, do a whole body skin exam, using a mirror to look at the skin on your back. Notify your caregiver of new moles, moles that have irregular borders, moles that are larger than a pencil eraser, or moles that have changed in shape or color.   Stay current with required immunizations.   Influenza. You need a dose every fall (or winter). The composition of  the flu vaccine changes each year, so being vaccinated once is not enough.   Pneumococcal polysaccharide. You need 1 to 2 doses if you smoke cigarettes or if you have certain chronic medical conditions. You need 1 dose at age 65 (or older) if you have never been vaccinated.   Tetanus, diphtheria, pertussis (Tdap, Td). Get 1 dose of   Tdap vaccine if you are younger than age 65, are over 65 and have contact with an infant, are a healthcare worker, are pregnant, or simply want to be protected from whooping cough. After that, you need a Td booster dose every 10 years. Consult your caregiver if you have not had at least 3 tetanus and diphtheria-containing shots sometime in your life or have a deep or dirty wound.   HPV. You need this vaccine if you are a woman age 26 or younger. The vaccine is given in 3 doses over 6 months.   Measles, mumps, rubella (MMR). You need at least 1 dose of MMR if you were born in 1957 or later. You may also need a second dose.   Meningococcal. If you are age 19 to 21 and a first-year college student living in a residence hall, or have one of several medical conditions, you need to get vaccinated against meningococcal disease. You may also need additional booster doses.   Zoster (shingles). If you are age 60 or older, you should get this vaccine.   Varicella (chickenpox). If you have never had chickenpox or you were vaccinated but received only 1 dose, talk to your caregiver to find out if you need this vaccine.   Hepatitis A. You need this vaccine if you have a specific risk factor for hepatitis A virus infection or you simply wish to be protected from this disease. The vaccine is usually given as 2 doses, 6 to 18 months apart.   Hepatitis B. You need this vaccine if you have a specific risk factor for hepatitis B virus infection or you simply wish to be protected from this disease. The vaccine is given in 3 doses, usually over 6 months.  Preventive Services /  Frequency Ages 19 to 39  Blood pressure check.** / Every 1 to 2 years.   Lipid and cholesterol check.** / Every 5 years beginning at age 20.   Clinical breast exam.** / Every 3 years for women in their 20s and 30s.   Pap test.** / Every 2 years from ages 21 through 29. Every 3 years starting at age 30 through age 65 or 70 with a history of 3 consecutive normal Pap tests.   HPV screening.** / Every 3 years from ages 30 through ages 65 to 70 with a history of 3 consecutive normal Pap tests.   Hepatitis C blood test.** / For any individual with known risks for hepatitis C.   Skin self-exam. / Monthly.   Influenza immunization.** / Every year.   Pneumococcal polysaccharide immunization.** / 1 to 2 doses if you smoke cigarettes or if you have certain chronic medical conditions.   Tetanus, diphtheria, pertussis (Tdap, Td) immunization. / A one-time dose of Tdap vaccine. After that, you need a Td booster dose every 10 years.   HPV immunization. / 3 doses over 6 months, if you are 26 and younger.   Measles, mumps, rubella (MMR) immunization. / You need at least 1 dose of MMR if you were born in 1957 or later. You may also need a second dose.   Meningococcal immunization. / 1 dose if you are age 19 to 21 and a first-year college student living in a residence hall, or have one of several medical conditions, you need to get vaccinated against meningococcal disease. You may also need additional booster doses.   Varicella immunization.** / Consult your caregiver.   Hepatitis A immunization.** / Consult your caregiver. 2 doses, 6 to 18 months   apart.   Hepatitis B immunization.** / Consult your caregiver. 3 doses usually over 6 months.  Ages 40 to 64  Blood pressure check.** / Every 1 to 2 years.   Lipid and cholesterol check.** / Every 5 years beginning at age 20.   Clinical breast exam.** / Every year after age 40.   Mammogram.** / Every year beginning at age 40 and continuing for as  long as you are in good health. Consult with your caregiver.   Pap test.** / Every 3 years starting at age 30 through age 65 or 70 with a history of 3 consecutive normal Pap tests.   HPV screening.** / Every 3 years from ages 30 through ages 65 to 70 with a history of 3 consecutive normal Pap tests.   Fecal occult blood test (FOBT) of stool. / Every year beginning at age 50 and continuing until age 75. You may not need to do this test if you get a colonoscopy every 10 years.   Flexible sigmoidoscopy or colonoscopy.** / Every 5 years for a flexible sigmoidoscopy or every 10 years for a colonoscopy beginning at age 50 and continuing until age 75.   Hepatitis C blood test.** / For all people born from 1945 through 1965 and any individual with known risks for hepatitis C.   Skin self-exam. / Monthly.   Influenza immunization.** / Every year.   Pneumococcal polysaccharide immunization.** / 1 to 2 doses if you smoke cigarettes or if you have certain chronic medical conditions.   Tetanus, diphtheria, pertussis (Tdap, Td) immunization.** / A one-time dose of Tdap vaccine. After that, you need a Td booster dose every 10 years.   Measles, mumps, rubella (MMR) immunization. / You need at least 1 dose of MMR if you were born in 1957 or later. You may also need a second dose.   Varicella immunization.** / Consult your caregiver.   Meningococcal immunization.** / Consult your caregiver.   Hepatitis A immunization.** / Consult your caregiver. 2 doses, 6 to 18 months apart.   Hepatitis B immunization.** / Consult your caregiver. 3 doses, usually over 6 months.  Ages 65 and over  Blood pressure check.** / Every 1 to 2 years.   Lipid and cholesterol check.** / Every 5 years beginning at age 20.   Clinical breast exam.** / Every year after age 40.   Mammogram.** / Every year beginning at age 40 and continuing for as long as you are in good health. Consult with your caregiver.   Pap test.** /  Every 3 years starting at age 30 through age 65 or 70 with a 3 consecutive normal Pap tests. Testing can be stopped between 65 and 70 with 3 consecutive normal Pap tests and no abnormal Pap or HPV tests in the past 10 years.   HPV screening.** / Every 3 years from ages 30 through ages 65 or 70 with a history of 3 consecutive normal Pap tests. Testing can be stopped between 65 and 70 with 3 consecutive normal Pap tests and no abnormal Pap or HPV tests in the past 10 years.   Fecal occult blood test (FOBT) of stool. / Every year beginning at age 50 and continuing until age 75. You may not need to do this test if you get a colonoscopy every 10 years.   Flexible sigmoidoscopy or colonoscopy.** / Every 5 years for a flexible sigmoidoscopy or every 10 years for a colonoscopy beginning at age 50 and continuing until age 75.   Hepatitis   C blood test.** / For all people born from 1945 through 1965 and any individual with known risks for hepatitis C.   Osteoporosis screening.** / A one-time screening for women ages 65 and over and women at risk for fractures or osteoporosis.   Skin self-exam. / Monthly.   Influenza immunization.** / Every year.   Pneumococcal polysaccharide immunization.** / 1 dose at age 65 (or older) if you have never been vaccinated.   Tetanus, diphtheria, pertussis (Tdap, Td) immunization. / A one-time dose of Tdap vaccine if you are over 65 and have contact with an infant, are a healthcare worker, or simply want to be protected from whooping cough. After that, you need a Td booster dose every 10 years.   Varicella immunization.** / Consult your caregiver.   Meningococcal immunization.** / Consult your caregiver.   Hepatitis A immunization.** / Consult your caregiver. 2 doses, 6 to 18 months apart.   Hepatitis B immunization.** / Check with your caregiver. 3 doses, usually over 6 months.  ** Family history and personal history of risk and conditions may change your caregiver's  recommendations. Document Released: 12/29/2001 Document Revised: 10/22/2011 Document Reviewed: 03/30/2011 [employer] Patient Information 2012 [employer]. 

## 2012-03-22 NOTE — Assessment & Plan Note (Signed)
Check labs 

## 2012-03-24 ENCOUNTER — Other Ambulatory Visit: Payer: Self-pay | Admitting: Family Medicine

## 2012-03-24 DIAGNOSIS — Z780 Asymptomatic menopausal state: Secondary | ICD-10-CM

## 2012-03-24 MED ORDER — MEDROXYPROGESTERONE ACETATE 5 MG PO TABS: 5.0000 mg | ORAL_TABLET | Freq: Every day | ORAL | Status: DC

## 2012-03-24 NOTE — Telephone Encounter (Signed)
Refill for Provera Tabs 5mg  Qty 90  Last written 4.30.12 Last instructions 1/2 tab po qd 10 days a month as directed. Brand name necessary.  Last OV 5.7.13

## 2012-03-26 LAB — URINE CULTURE: Colony Count: 80000

## 2012-03-28 ENCOUNTER — Ambulatory Visit (INDEPENDENT_AMBULATORY_CARE_PROVIDER_SITE_OTHER): Payer: Medicare Other | Admitting: Family Medicine

## 2012-03-28 ENCOUNTER — Encounter: Payer: Self-pay | Admitting: Family Medicine

## 2012-03-28 VITALS — BP 124/74 | HR 68 | Temp 98.4°F | Wt 184.6 lb

## 2012-03-28 DIAGNOSIS — J329 Chronic sinusitis, unspecified: Secondary | ICD-10-CM

## 2012-03-28 MED ORDER — CEFUROXIME AXETIL 500 MG PO TABS: 500.0000 mg | ORAL_TABLET | Freq: Two times a day (BID) | ORAL | Status: AC

## 2012-03-28 NOTE — Progress Notes (Signed)
  Subjective:     Wendy Velazquez is a 71 y.o. female who presents for evaluation of sinus pain. Symptoms include: congestion, cough, facial pain, headaches, nasal congestion and sinus pressure. Onset of symptoms was 4 days ago. Symptoms have been gradually worsening since that time. Past history is significant for no history of pneumonia or bronchitis. Patient is a non-smoker.  The following portions of the patient's history were reviewed and updated as appropriate: allergies, current medications, past family history, past medical history, past social history, past surgical history and problem list.  Review of Systems Pertinent items are noted in HPI.   Objective:    BP 124/74  Pulse 68  Temp(Src) 98.4 F (36.9 C) (Oral)  Wt 184 lb 9.6 oz (83.734 kg)  SpO2 95% General appearance: alert, cooperative, appears stated age and no distress Ears: normal TM's and external ear canals both ears Nose: green discharge, moderate congestion, turbinates red, swollen, edematous, sinus tenderness bilateral Throat: abnormal findings: mild oropharyngeal erythema and pnd Neck: mild anterior cervical adenopathy, supple, symmetrical, trachea midline and thyroid not enlarged, symmetric, no tenderness/mass/nodules Lungs: clear to auscultation bilaterally    Assessment:    Acute bacterial sinusitis.    Plan:    Nasal steroids per medication orders. Antihistamines per medication orders. Ceftin per medication orders. f/u prn

## 2012-03-28 NOTE — Patient Instructions (Signed)

## 2012-04-18 ENCOUNTER — Telehealth: Payer: Self-pay | Admitting: Family Medicine

## 2012-04-18 DIAGNOSIS — IMO0002 Reserved for concepts with insufficient information to code with codable children: Secondary | ICD-10-CM

## 2012-04-18 NOTE — Telephone Encounter (Signed)
Caller: Wendy Velazquez/Patient; PCP: Lelon Perla.; CB#: [phone] re:  Would like to get name of "doctor that works on feet";  Dr. Laury Axon had made recommendation previously.  She states she has not had good luck with podiatrists.  Toenails painful when wearning closed toe shoes.  Information sent to provider for review and response per PCP Calls, No Triage protocol.

## 2012-04-18 NOTE — Telephone Encounter (Signed)
Refer to ortho---- Dr Lestine Box

## 2012-04-19 ENCOUNTER — Telehealth: Payer: Self-pay | Admitting: Family Medicine

## 2012-04-19 DIAGNOSIS — IMO0002 Reserved for concepts with insufficient information to code with codable children: Secondary | ICD-10-CM

## 2012-04-19 NOTE — Telephone Encounter (Signed)
Pt states she just needs the name of the MD that Dr. Laury Axon recommended for her feet. She states she has a toenail problem and she does not think she needs to see an orthopedic doctor. She states she does not want to go to a podiatrist because she has not been satisfied in the past. Pt does not want a referral. Pt would like the name of "the MD who works on feet," that Dr. Laury Axon mentioned to her a couple years ago.

## 2012-04-19 NOTE — Telephone Encounter (Signed)
Orthopedics are the only doctors that work on feet besides podiatrist---if its the nails then maybe a dermatologist.

## 2012-04-19 NOTE — Telephone Encounter (Signed)
Podiatrist referral put in.    KP

## 2012-04-19 NOTE — Telephone Encounter (Signed)
Please advise      KP 

## 2012-04-25 DIAGNOSIS — B351 Tinea unguium: Secondary | ICD-10-CM | POA: Diagnosis not present

## 2012-04-25 DIAGNOSIS — L600 Ingrowing nail: Secondary | ICD-10-CM | POA: Diagnosis not present

## 2012-04-25 DIAGNOSIS — I70209 Unspecified atherosclerosis of native arteries of extremities, unspecified extremity: Secondary | ICD-10-CM | POA: Diagnosis not present

## 2012-04-28 ENCOUNTER — Encounter: Payer: Self-pay | Admitting: Family Medicine

## 2012-05-12 ENCOUNTER — Other Ambulatory Visit: Payer: Self-pay | Admitting: *Deleted

## 2012-05-12 DIAGNOSIS — I10 Essential (primary) hypertension: Secondary | ICD-10-CM

## 2012-05-12 MED ORDER — SPIRONOLACTONE 25 MG PO TABS: ORAL_TABLET | ORAL | Status: DC

## 2012-05-18 DIAGNOSIS — L03039 Cellulitis of unspecified toe: Secondary | ICD-10-CM | POA: Diagnosis not present

## 2012-05-18 DIAGNOSIS — M898X9 Other specified disorders of bone, unspecified site: Secondary | ICD-10-CM | POA: Diagnosis not present

## 2012-05-18 DIAGNOSIS — M79609 Pain in unspecified limb: Secondary | ICD-10-CM | POA: Diagnosis not present

## 2012-06-01 DIAGNOSIS — M2010 Hallux valgus (acquired), unspecified foot: Secondary | ICD-10-CM | POA: Diagnosis not present

## 2012-06-01 DIAGNOSIS — Q6689 Other  specified congenital deformities of feet: Secondary | ICD-10-CM | POA: Diagnosis not present

## 2012-06-01 DIAGNOSIS — L03039 Cellulitis of unspecified toe: Secondary | ICD-10-CM | POA: Diagnosis not present

## 2012-06-09 DIAGNOSIS — C679 Malignant neoplasm of bladder, unspecified: Secondary | ICD-10-CM | POA: Diagnosis not present

## 2012-06-14 DIAGNOSIS — B351 Tinea unguium: Secondary | ICD-10-CM | POA: Diagnosis not present

## 2012-06-14 DIAGNOSIS — L608 Other nail disorders: Secondary | ICD-10-CM | POA: Diagnosis not present

## 2012-06-14 DIAGNOSIS — L03039 Cellulitis of unspecified toe: Secondary | ICD-10-CM | POA: Diagnosis not present

## 2012-07-01 ENCOUNTER — Other Ambulatory Visit: Payer: Self-pay | Admitting: Family Medicine

## 2012-07-01 DIAGNOSIS — Z1231 Encounter for screening mammogram for malignant neoplasm of breast: Secondary | ICD-10-CM

## 2012-07-05 DIAGNOSIS — B351 Tinea unguium: Secondary | ICD-10-CM | POA: Diagnosis not present

## 2012-07-19 ENCOUNTER — Ambulatory Visit
Admission: RE | Admit: 2012-07-19 | Discharge: 2012-07-19 | Disposition: A | Discharge status: Home / Self Care | Payer: Medicare Other | Source: Ambulatory Visit | Attending: Family Medicine | Admitting: Family Medicine

## 2012-07-19 DIAGNOSIS — Z1231 Encounter for screening mammogram for malignant neoplasm of breast: Secondary | ICD-10-CM

## 2012-07-21 ENCOUNTER — Ambulatory Visit (INDEPENDENT_AMBULATORY_CARE_PROVIDER_SITE_OTHER): Payer: Medicare Other | Admitting: Family Medicine

## 2012-07-21 VITALS — BP 130/90 | HR 68 | Temp 98.1°F | Wt 185.0 lb

## 2012-07-21 DIAGNOSIS — Z872 Personal history of diseases of the skin and subcutaneous tissue: Secondary | ICD-10-CM

## 2012-07-21 DIAGNOSIS — N951 Menopausal and female climacteric states: Secondary | ICD-10-CM

## 2012-07-21 HISTORY — DX: Personal history of diseases of the skin and subcutaneous tissue: Z87.2

## 2012-07-21 NOTE — Assessment & Plan Note (Signed)
Pt put on HRT and that has controlled them

## 2012-07-21 NOTE — Progress Notes (Signed)
  Subjective:    Patient ID: Wendy Velazquez, female    DOB: 1941-05-15, 71 y.o.   MRN: [id number]  HPI Pt here to discuss her medication.  She was put on provera years ago by her gyn who is now retired for breast cysts and she was on ogen as well.   She was told by her gyn and surgeon , Dr Zachery Dakins to stay on brand name.  Dr Zachery Dakins has retired.    Pt has been on them for years and is not comfortable changing to generic.  She has a form to stay on brand name.        Review of Systems as a   Objective:   Physical Exam  Constitutional: She is oriented to person, place, and time. She appears well-developed and well-nourished.  Neurological: She is alert and oriented to person, place, and time.  Psychiatric: She has a normal mood and affect. Her behavior is normal. Judgment and thought content normal.          Assessment & Plan:

## 2012-07-23 ENCOUNTER — Encounter: Payer: Self-pay | Admitting: Family Medicine

## 2012-07-23 NOTE — Assessment & Plan Note (Signed)
Pt needs brand name hrt --she states she was told that by her gyn and surgeon years ago .  They have both retired. She will attempt to get records.

## 2012-08-30 ENCOUNTER — Other Ambulatory Visit: Payer: Self-pay | Admitting: Family Medicine

## 2012-08-30 NOTE — Telephone Encounter (Signed)
Message copied by Verner Chol on Tue Aug 30, 2012  1:38 PM ------      Message from: [place], [place]      Created: Tue Aug 30, 2012 12:31 PM      Regarding: Records not recieved       Pt called upset [phone]- the insurance is stating that they have not received the records that were suppose to be sent from multiple different doctors. She states that you were getting old ones from different doctors and that they have confirmed that they sent them to you. She is upset because they have not been sent yet- talk to me. Amy

## 2012-08-30 NOTE — Telephone Encounter (Signed)
Called pt verified that we did get the records she requested to be released to [place]. ASK PT TO CLARIFY insurance issue. Pt stated we were suppose to send all records to her insurance company to get medication approval for a particular medication. I advised pt she would need to come in and sign a records release to authrorize [place] to send all of her records to her insurance company. Pt stated that is what she thought she was doing as she spoke with Dr.lowne and dr.lowne told her to see me. She stated we have a failure to communicate in this office and she thinks we are doing this to keep her from getting her name brand medications. She stated she would be up here Thursday or Friday to sign another release for [place] to send her records to her insurance company. PT was very angry and was not understanding the HIPPA Information I attempted to explain to her. I think patient is confusing a medical records release with a medication prior authorization (as per [employer] back in June) but patient would not work with me in assisting her with trying to clear up and understand the matter.

## 2012-08-31 ENCOUNTER — Other Ambulatory Visit: Payer: Self-pay

## 2012-08-31 DIAGNOSIS — Z780 Asymptomatic menopausal state: Secondary | ICD-10-CM

## 2012-08-31 NOTE — Telephone Encounter (Signed)
Pt states she wants a written Rx for brand name Provera 5mg  not generic. Pt states Rx needs to be ready tomorrow when she comes in. She wants to take written Rx to another pharmacy to get it filled cheaper.  Plz advise    MW

## 2012-09-02 MED ORDER — MEDROXYPROGESTERONE ACETATE 5 MG PO TABS: 5.0000 mg | ORAL_TABLET | Freq: Every day | ORAL | Status: DC

## 2012-09-02 NOTE — Telephone Encounter (Signed)
Medication is prescribed regularly by PCP, okay #90 no refills

## 2012-09-05 ENCOUNTER — Telehealth: Payer: Self-pay | Admitting: Family Medicine

## 2012-09-05 NOTE — Telephone Encounter (Signed)
Message copied by Verner Chol on Mon Sep 05, 2012  9:03 AM ------      Message from: [place], [place]      Created: Tue Aug 30, 2012 12:31 PM      Regarding: Records not recieved       Pt called upset [phone]- the insurance is stating that they have not received the records that were suppose to be sent from multiple different doctors. She states that you were getting old ones from different doctors and that they have confirmed that they sent them to you. She is upset because they have not been sent yet- talk to me. Amy

## 2012-09-05 NOTE — Telephone Encounter (Signed)
SPOKE TO T SENT TO NANCY

## 2012-09-06 ENCOUNTER — Telehealth: Payer: Self-pay

## 2012-09-06 NOTE — Telephone Encounter (Signed)
Discussed with patient and all she wanted was to know if thew Rx was sent for the brand name Provera.  Advised it was but I was I will call to confirm. I spoke with Wynona Canes at [facility] and he assured me the Rx was received for Brand name and it is due to be sent out on 09/08/12. I had the patient on the phone to conference the call but she declined and thank me for the help. I also made her aware that her paperwork was faxed 09/01/12 and she voiced understanding      KP

## 2012-09-06 NOTE — Telephone Encounter (Signed)
Pt called in wanting a written Rx mad because Rx was sent [facility] to [facility]. I spoke with pt before and advised Drue Novel that pt wanted him to print Rx instead of sending it. Paz sent it.      MW

## 2012-09-16 DIAGNOSIS — D1801 Hemangioma of skin and subcutaneous tissue: Secondary | ICD-10-CM | POA: Diagnosis not present

## 2012-09-16 DIAGNOSIS — L821 Other seborrheic keratosis: Secondary | ICD-10-CM | POA: Diagnosis not present

## 2012-09-16 DIAGNOSIS — L819 Disorder of pigmentation, unspecified: Secondary | ICD-10-CM | POA: Diagnosis not present

## 2012-09-16 DIAGNOSIS — D234 Other benign neoplasm of skin of scalp and neck: Secondary | ICD-10-CM | POA: Diagnosis not present

## 2012-09-16 DIAGNOSIS — Z85820 Personal history of malignant melanoma of skin: Secondary | ICD-10-CM | POA: Diagnosis not present

## 2012-10-04 DIAGNOSIS — M2060 Acquired deformities of toe(s), unspecified, unspecified foot: Secondary | ICD-10-CM | POA: Diagnosis not present

## 2012-10-10 DIAGNOSIS — H26499 Other secondary cataract, unspecified eye: Secondary | ICD-10-CM | POA: Diagnosis not present

## 2012-10-10 DIAGNOSIS — H1045 Other chronic allergic conjunctivitis: Secondary | ICD-10-CM | POA: Diagnosis not present

## 2012-10-10 DIAGNOSIS — H43399 Other vitreous opacities, unspecified eye: Secondary | ICD-10-CM | POA: Diagnosis not present

## 2012-10-14 ENCOUNTER — Ambulatory Visit (INDEPENDENT_AMBULATORY_CARE_PROVIDER_SITE_OTHER): Payer: Medicare Other | Admitting: Family Medicine

## 2012-10-14 ENCOUNTER — Encounter: Payer: Self-pay | Admitting: Family Medicine

## 2012-10-14 VITALS — BP 120/74 | HR 58 | Temp 98.3°F | Wt 190.0 lb

## 2012-10-14 DIAGNOSIS — R05 Cough: Secondary | ICD-10-CM

## 2012-10-14 DIAGNOSIS — J069 Acute upper respiratory infection, unspecified: Secondary | ICD-10-CM

## 2012-10-14 DIAGNOSIS — R059 Cough, unspecified: Secondary | ICD-10-CM | POA: Diagnosis not present

## 2012-10-14 MED ORDER — CEFUROXIME AXETIL 500 MG PO TABS: 500.0000 mg | ORAL_TABLET | Freq: Two times a day (BID) | ORAL | Status: AC

## 2012-10-14 MED ORDER — HYDROCODONE-HOMATROPINE 5-1.5 MG/5ML PO SYRP: ORAL_SOLUTION | ORAL | Status: DC

## 2012-10-14 NOTE — Progress Notes (Signed)
  Subjective:     Wendy Velazquez is a 71 y.o. female who presents for evaluation of sore throat. Associated symptoms include hoarseness, post nasal drip, productive cough and sore throat. Onset of symptoms was 3 days ago, and have been gradually worsening since that time. She is drinking plenty of fluids. She has not had a recent close exposure to someone with proven streptococcal pharyngitis.  Pt been taking antihistamines, mucinex, airborne with no relief.      The following portions of the patient's history were reviewed and updated as appropriate: allergies, current medications, past family history, past medical history, past social history, past surgical history and problem list.  Review of Systems Pertinent items are noted in HPI.    Objective:    BP 120/74  Pulse 58  Temp 98.3 F (36.8 C) (Oral)  Wt 190 lb (86.183 kg)  SpO2 98% General appearance: alert, cooperative, appears stated age and no distress Ears: normal TM's and external ear canals both ears Nose: clear discharge, no congestion, turbinates red, swollen Throat: abnormal findings: mild oropharyngeal erythema and pnd Neck: no adenopathy, supple, symmetrical, trachea midline and thyroid not enlarged, symmetric, no tenderness/mass/nodules Lungs: clear to auscultation bilaterally Heart: S1, S2 normal  Laboratory Strep test not done. Results:na.    Assessment:    Acute pharyngitis, likely  Viral pharyngitis.    Plan:    Follow up as needed.  Cont; antihistamine and tylenol Gargle with salt water If symptoms worsen will fill abx

## 2012-10-14 NOTE — Patient Instructions (Signed)

## 2012-10-15 ENCOUNTER — Telehealth: Payer: Self-pay | Admitting: Family Medicine

## 2012-10-15 NOTE — Telephone Encounter (Signed)
[facility] Triage Call Report Triage Record Num: [id number] Operator: Roselyn Meier Patient Name: Wendy Velazquez Call Date & Time: 10/13/2012 8:33:48AM Patient Phone: [phone] PCP: Lelon Perla Patient Gender: Female PCP Fax : [phone] Patient DOB: 1941/08/29 Practice Name: [facility] Reason for Call: Caller: [name]/Patient; PCP: Lelon Perla.; CB#: [phone]; Call regarding Sore Throat; Onset 10/11/12 with sore throat. This am pt reports fever of (98.7 -oral) and hoarse voice. Triaged patient per Sore Throat or Hoarseness Protocol. Provide Home Care Disposition for 'Sore Throat and no other symptoms'. Home care advice and call back parameters given per protocol. Appt scheduled for 0915 on 10/14/12 with Dr Laury Axon Protocol(s) Used: Sore Throat or Hoarseness Recommended Outcome per Protocol: Provide Home/Self Care Reason for Outcome: Sore throat AND no other symptoms Care Advice: ~ See a provider if have sore throat symptoms for 2 weeks or more, or if have swollen lymph nodes. Analgesic/Antipyretic Advice - Acetaminophen: Consider acetaminophen as directed on label or by pharmacist/provider for pain or fever PRECAUTIONS: - Use if there is no history of liver disease, alcoholism, or intake of three or more alcohol drinks per day - Only if approved by provider during pregnancy or when breastfeeding - During pregnancy, acetaminophen should not be taken more than 3 consecutive days without telling provider - Do not exceed recommended dose or frequency ~ Sore Throat Relief: - Use warm salt water gargles 3 to 4 times/day, as needed (1/2 tsp. salt in 8 oz. [.2 liters] water). - Suck on hard candy, nonprescription or herbal throat lozenges (sugar-free if diabetic) - Eat soothing, soft food/fluids (broths, soups, or honey and lemon juice in hot tea, Popsicles, frozen yogurt or sherbet, scrambled eggs, cooked cereals, Jell-O or puddings) whichever is most  comforting. - Avoid eating salty, spicy or acidic foods. ~

## 2012-10-17 NOTE — Telephone Encounter (Signed)
Seen 10/14/12      KP

## 2012-12-31 ENCOUNTER — Encounter: Payer: Self-pay | Admitting: Family Medicine

## 2012-12-31 ENCOUNTER — Other Ambulatory Visit: Payer: Self-pay

## 2013-03-16 DIAGNOSIS — L821 Other seborrheic keratosis: Secondary | ICD-10-CM | POA: Diagnosis not present

## 2013-03-16 DIAGNOSIS — D235 Other benign neoplasm of skin of trunk: Secondary | ICD-10-CM | POA: Diagnosis not present

## 2013-03-16 DIAGNOSIS — L2089 Other atopic dermatitis: Secondary | ICD-10-CM | POA: Diagnosis not present

## 2013-03-16 DIAGNOSIS — L819 Disorder of pigmentation, unspecified: Secondary | ICD-10-CM | POA: Diagnosis not present

## 2013-03-16 DIAGNOSIS — D1801 Hemangioma of skin and subcutaneous tissue: Secondary | ICD-10-CM | POA: Diagnosis not present

## 2013-03-16 DIAGNOSIS — Z85820 Personal history of malignant melanoma of skin: Secondary | ICD-10-CM | POA: Diagnosis not present

## 2013-03-16 DIAGNOSIS — I789 Disease of capillaries, unspecified: Secondary | ICD-10-CM | POA: Diagnosis not present

## 2013-03-16 DIAGNOSIS — D239 Other benign neoplasm of skin, unspecified: Secondary | ICD-10-CM | POA: Diagnosis not present

## 2013-03-29 ENCOUNTER — Encounter: Payer: Self-pay | Admitting: Family Medicine

## 2013-03-29 ENCOUNTER — Ambulatory Visit (INDEPENDENT_AMBULATORY_CARE_PROVIDER_SITE_OTHER): Payer: Medicare Other | Admitting: Family Medicine

## 2013-03-29 VITALS — BP 124/71 | HR 61 | Temp 98.2°F | Ht 63.0 in | Wt 184.8 lb

## 2013-03-29 DIAGNOSIS — E039 Hypothyroidism, unspecified: Secondary | ICD-10-CM | POA: Diagnosis not present

## 2013-03-29 DIAGNOSIS — H9190 Unspecified hearing loss, unspecified ear: Secondary | ICD-10-CM | POA: Diagnosis not present

## 2013-03-29 DIAGNOSIS — I10 Essential (primary) hypertension: Secondary | ICD-10-CM | POA: Diagnosis not present

## 2013-03-29 DIAGNOSIS — M129 Arthropathy, unspecified: Secondary | ICD-10-CM | POA: Diagnosis not present

## 2013-03-29 DIAGNOSIS — Z780 Asymptomatic menopausal state: Secondary | ICD-10-CM | POA: Diagnosis not present

## 2013-03-29 DIAGNOSIS — Z0000 Encounter for general adult medical examination without abnormal findings: Secondary | ICD-10-CM

## 2013-03-29 DIAGNOSIS — M1990 Unspecified osteoarthritis, unspecified site: Secondary | ICD-10-CM

## 2013-03-29 LAB — CBC WITH DIFFERENTIAL/PLATELET
Basophils Absolute: 0 10*3/uL (ref 0.0–0.1)
Basophils Relative: 0.3 % (ref 0.0–3.0)
Hemoglobin: 14.6 g/dL (ref 12.0–15.0)
Lymphocytes Relative: 24.8 % (ref 12.0–46.0)
Monocytes Relative: 6.5 % (ref 3.0–12.0)
Neutro Abs: 5.4 10*3/uL (ref 1.4–7.7)
RBC: 4.84 Mil/uL (ref 3.87–5.11)

## 2013-03-29 LAB — POCT URINALYSIS DIPSTICK
Ketones, UA: NEGATIVE
Leukocytes, UA: NEGATIVE
Nitrite, UA: NEGATIVE
Protein, UA: NEGATIVE

## 2013-03-29 LAB — BASIC METABOLIC PANEL
Calcium: 9.3 mg/dL (ref 8.4–10.5)
GFR: 58.13 mL/min — ABNORMAL LOW (ref >60.00)
Sodium: 138 mEq/L (ref 135–145)

## 2013-03-29 LAB — HEPATIC FUNCTION PANEL
AST: 20 U/L (ref 0–37)
Albumin: 4.3 g/dL (ref 3.5–5.2)
Alkaline Phosphatase: 78 U/L (ref 39–117)
Total Protein: 7.1 g/dL (ref 6.0–8.3)

## 2013-03-29 LAB — TSH: TSH: 1.16 u[IU]/mL (ref 0.35–5.50)

## 2013-03-29 LAB — LIPID PANEL
Total CHOL/HDL Ratio: 4
VLDL: 36.8 mg/dL (ref 0.0–40.0)

## 2013-03-29 LAB — MICROALBUMIN / CREATININE URINE RATIO: Microalb Creat Ratio: 0.9 mg/g (ref 0.0–30.0)

## 2013-03-29 MED ORDER — MEDROXYPROGESTERONE ACETATE 5 MG PO TABS: 5.0000 mg | ORAL_TABLET | Freq: Every day | ORAL | Status: DC

## 2013-03-29 MED ORDER — SYNTHROID 75 MCG PO TABS: 75.0000 ug | ORAL_TABLET | Freq: Every day | ORAL | Status: DC

## 2013-03-29 MED ORDER — ESTROPIPATE 0.75 MG PO TABS: 0.7500 mg | ORAL_TABLET | Freq: Every day | ORAL | Status: DC

## 2013-03-29 MED ORDER — PROPRANOLOL HCL 10 MG PO TABS: 10.0000 mg | ORAL_TABLET | Freq: Two times a day (BID) | ORAL | Status: DC

## 2013-03-29 MED ORDER — MELOXICAM 15 MG PO TABS: ORAL_TABLET | ORAL | Status: DC

## 2013-03-29 MED ORDER — AMLODIPINE BESYLATE 5 MG PO TABS: 5.0000 mg | ORAL_TABLET | Freq: Every day | ORAL | Status: DC

## 2013-03-29 MED ORDER — SPIRONOLACTONE 25 MG PO TABS: ORAL_TABLET | ORAL | Status: DC

## 2013-03-29 NOTE — Assessment & Plan Note (Signed)
Check labs con't meds 

## 2013-03-29 NOTE — Patient Instructions (Addendum)
Preventive Care for Adults, Female A healthy lifestyle and preventive care can promote health and wellness. Preventive health guidelines for women include the following key practices.  A routine yearly physical is a good way to check with your caregiver about your health and preventive screening. It is a chance to share any concerns and updates on your health, and to receive a thorough exam.  Visit your dentist for a routine exam and preventive care every 6 months. Brush your teeth twice a day and floss once a day. Good oral hygiene prevents tooth decay and gum disease.  The frequency of eye exams is based on your age, health, family medical history, use of contact lenses, and other factors. Follow your caregiver's recommendations for frequency of eye exams.  Eat a healthy diet. Foods like vegetables, fruits, whole grains, low-fat dairy products, and lean protein foods contain the nutrients you need without too many calories. Decrease your intake of foods high in solid fats, added sugars, and salt. Eat the right amount of calories for you.Get information about a proper diet from your caregiver, if necessary.  Regular physical exercise is one of the most important things you can do for your health. Most adults should get at least 150 minutes of moderate-intensity exercise (any activity that increases your heart rate and causes you to sweat) each week. In addition, most adults need muscle-strengthening exercises on 2 or more days a week.  Maintain a healthy weight. The body mass index (BMI) is a screening tool to identify possible weight problems. It provides an estimate of body fat based on height and weight. Your caregiver can help determine your BMI, and can help you achieve or maintain a healthy weight.For adults 20 years and older:  A BMI below 18.5 is considered underweight.  A BMI of 18.5 to 24.9 is normal.  A BMI of 25 to 29.9 is considered overweight.  A BMI of 30 and above is  considered obese.  Maintain normal blood lipids and cholesterol levels by exercising and minimizing your intake of saturated fat. Eat a balanced diet with plenty of fruit and vegetables. Blood tests for lipids and cholesterol should begin at age 20 and be repeated every 5 years. If your lipid or cholesterol levels are high, you are over 50, or you are at high risk for heart disease, you may need your cholesterol levels checked more frequently.Ongoing high lipid and cholesterol levels should be treated with medicines if diet and exercise are not effective.  If you smoke, find out from your caregiver how to quit. If you do not use tobacco, do not start.  If you are pregnant, do not drink alcohol. If you are breastfeeding, be very cautious about drinking alcohol. If you are not pregnant and choose to drink alcohol, do not exceed 1 drink per day. One drink is considered to be 12 ounces (355 mL) of beer, 5 ounces (148 mL) of wine, or 1.5 ounces (44 mL) of liquor.  Avoid use of street drugs. Do not share needles with anyone. Ask for help if you need support or instructions about stopping the use of drugs.  High blood pressure causes heart disease and increases the risk of stroke. Your blood pressure should be checked at least every 1 to 2 years. Ongoing high blood pressure should be treated with medicines if weight loss and exercise are not effective.  If you are 55 to 71 years old, ask your caregiver if you should take aspirin to prevent strokes.  Diabetes   screening involves taking a blood sample to check your fasting blood sugar level. This should be done once every 3 years, after age 45, if you are within normal weight and without risk factors for diabetes. Testing should be considered at a younger age or be carried out more frequently if you are overweight and have at least 1 risk factor for diabetes.  Breast cancer screening is essential preventive care for women. You should practice "breast  self-awareness." This means understanding the normal appearance and feel of your breasts and may include breast self-examination. Any changes detected, no matter how small, should be reported to a caregiver. Women in their 20s and 30s should have a clinical breast exam (CBE) by a caregiver as part of a regular health exam every 1 to 3 years. After age 40, women should have a CBE every year. Starting at age 40, women should consider having a mammography (breast X-ray test) every year. Women who have a family history of breast cancer should talk to their caregiver about genetic screening. Women at a high risk of breast cancer should talk to their caregivers about having magnetic resonance imaging (MRI) and a mammography every year.  The Pap test is a screening test for cervical cancer. A Pap test can show cell changes on the cervix that might become cervical cancer if left untreated. A Pap test is a procedure in which cells are obtained and examined from the lower end of the uterus (cervix).  Women should have a Pap test starting at age 21.  Between ages 21 and 29, Pap tests should be repeated every 2 years.  Beginning at age 30, you should have a Pap test every 3 years as long as the past 3 Pap tests have been normal.  Some women have medical problems that increase the chance of getting cervical cancer. Talk to your caregiver about these problems. It is especially important to talk to your caregiver if a new problem develops soon after your last Pap test. In these cases, your caregiver may recommend more frequent screening and Pap tests.  The above recommendations are the same for women who have or have not gotten the vaccine for human papillomavirus (HPV).  If you had a hysterectomy for a problem that was not cancer or a condition that could lead to cancer, then you no longer need Pap tests. Even if you no longer need a Pap test, a regular exam is a good idea to make sure no other problems are  starting.  If you are between ages 65 and 70, and you have had normal Pap tests going back 10 years, you no longer need Pap tests. Even if you no longer need a Pap test, a regular exam is a good idea to make sure no other problems are starting.  If you have had past treatment for cervical cancer or a condition that could lead to cancer, you need Pap tests and screening for cancer for at least 20 years after your treatment.  If Pap tests have been discontinued, risk factors (such as a new sexual partner) need to be reassessed to determine if screening should be resumed.  The HPV test is an additional test that may be used for cervical cancer screening. The HPV test looks for the virus that can cause the cell changes on the cervix. The cells collected during the Pap test can be tested for HPV. The HPV test could be used to screen women aged 30 years and older, and should   be used in women of any age who have unclear Pap test results. After the age of 30, women should have HPV testing at the same frequency as a Pap test.  Colorectal cancer can be detected and often prevented. Most routine colorectal cancer screening begins at the age of 50 and continues through age 75. However, your caregiver may recommend screening at an earlier age if you have risk factors for colon cancer. On a yearly basis, your caregiver may provide home test kits to check for hidden blood in the stool. Use of a small camera at the end of a tube, to directly examine the colon (sigmoidoscopy or colonoscopy), can detect the earliest forms of colorectal cancer. Talk to your caregiver about this at age 50, when routine screening begins. Direct examination of the colon should be repeated every 5 to 10 years through age 75, unless early forms of pre-cancerous polyps or small growths are found.  Hepatitis C blood testing is recommended for all people born from 1945 through 1965 and any individual with known risks for hepatitis C.  Practice  safe sex. Use condoms and avoid high-risk sexual practices to reduce the spread of sexually transmitted infections (STIs). STIs include gonorrhea, chlamydia, syphilis, trichomonas, herpes, HPV, and human immunodeficiency virus (HIV). Herpes, HIV, and HPV are viral illnesses that have no cure. They can result in disability, cancer, and death. Sexually active women aged 25 and younger should be checked for chlamydia. Older women with new or multiple partners should also be tested for chlamydia. Testing for other STIs is recommended if you are sexually active and at increased risk.  Osteoporosis is a disease in which the bones lose minerals and strength with aging. This can result in serious bone fractures. The risk of osteoporosis can be identified using a bone density scan. Women ages 65 and over and women at risk for fractures or osteoporosis should discuss screening with their caregivers. Ask your caregiver whether you should take a calcium supplement or vitamin D to reduce the rate of osteoporosis.  Menopause can be associated with physical symptoms and risks. Hormone replacement therapy is available to decrease symptoms and risks. You should talk to your caregiver about whether hormone replacement therapy is right for you.  Use sunscreen with sun protection factor (SPF) of 30 or more. Apply sunscreen liberally and repeatedly throughout the day. You should seek shade when your shadow is shorter than you. Protect yourself by wearing long sleeves, pants, a wide-brimmed hat, and sunglasses year round, whenever you are outdoors.  Once a month, do a whole body skin exam, using a mirror to look at the skin on your back. Notify your caregiver of new moles, moles that have irregular borders, moles that are larger than a pencil eraser, or moles that have changed in shape or color.  Stay current with required immunizations.  Influenza. You need a dose every fall (or winter). The composition of the flu vaccine  changes each year, so being vaccinated once is not enough.  Pneumococcal polysaccharide. You need 1 to 2 doses if you smoke cigarettes or if you have certain chronic medical conditions. You need 1 dose at age 65 (or older) if you have never been vaccinated.  Tetanus, diphtheria, pertussis (Tdap, Td). Get 1 dose of Tdap vaccine if you are younger than age 65, are over 65 and have contact with an infant, are a healthcare worker, are pregnant, or simply want to be protected from whooping cough. After that, you need a Td   booster dose every 10 years. Consult your caregiver if you have not had at least 3 tetanus and diphtheria-containing shots sometime in your life or have a deep or dirty wound.  HPV. You need this vaccine if you are a woman age 26 or younger. The vaccine is given in 3 doses over 6 months.  Measles, mumps, rubella (MMR). You need at least 1 dose of MMR if you were born in 1957 or later. You may also need a second dose.  Meningococcal. If you are age 19 to 21 and a first-year college student living in a residence hall, or have one of several medical conditions, you need to get vaccinated against meningococcal disease. You may also need additional booster doses.  Zoster (shingles). If you are age 60 or older, you should get this vaccine.  Varicella (chickenpox). If you have never had chickenpox or you were vaccinated but received only 1 dose, talk to your caregiver to find out if you need this vaccine.  Hepatitis A. You need this vaccine if you have a specific risk factor for hepatitis A virus infection or you simply wish to be protected from this disease. The vaccine is usually given as 2 doses, 6 to 18 months apart.  Hepatitis B. You need this vaccine if you have a specific risk factor for hepatitis B virus infection or you simply wish to be protected from this disease. The vaccine is given in 3 doses, usually over 6 months. Preventive Services / Frequency Ages 19 to 39  Blood  pressure check.** / Every 1 to 2 years.  Lipid and cholesterol check.** / Every 5 years beginning at age 20.  Clinical breast exam.** / Every 3 years for women in their 20s and 30s.  Pap test.** / Every 2 years from ages 21 through 29. Every 3 years starting at age 30 through age 65 or 70 with a history of 3 consecutive normal Pap tests.  HPV screening.** / Every 3 years from ages 30 through ages 65 to 70 with a history of 3 consecutive normal Pap tests.  Hepatitis C blood test.** / For any individual with known risks for hepatitis C.  Skin self-exam. / Monthly.  Influenza immunization.** / Every year.  Pneumococcal polysaccharide immunization.** / 1 to 2 doses if you smoke cigarettes or if you have certain chronic medical conditions.  Tetanus, diphtheria, pertussis (Tdap, Td) immunization. / A one-time dose of Tdap vaccine. After that, you need a Td booster dose every 10 years.  HPV immunization. / 3 doses over 6 months, if you are 26 and younger.  Measles, mumps, rubella (MMR) immunization. / You need at least 1 dose of MMR if you were born in 1957 or later. You may also need a second dose.  Meningococcal immunization. / 1 dose if you are age 19 to 21 and a first-year college student living in a residence hall, or have one of several medical conditions, you need to get vaccinated against meningococcal disease. You may also need additional booster doses.  Varicella immunization.** / Consult your caregiver.  Hepatitis A immunization.** / Consult your caregiver. 2 doses, 6 to 18 months apart.  Hepatitis B immunization.** / Consult your caregiver. 3 doses usually over 6 months. Ages 40 to 64  Blood pressure check.** / Every 1 to 2 years.  Lipid and cholesterol check.** / Every 5 years beginning at age 20.  Clinical breast exam.** / Every year after age 40.  Mammogram.** / Every year beginning at age 40   and continuing for as long as you are in good health. Consult with your  caregiver.  Pap test.** / Every 3 years starting at age 30 through age 65 or 70 with a history of 3 consecutive normal Pap tests.  HPV screening.** / Every 3 years from ages 30 through ages 65 to 70 with a history of 3 consecutive normal Pap tests.  Fecal occult blood test (FOBT) of stool. / Every year beginning at age 50 and continuing until age 75. You may not need to do this test if you get a colonoscopy every 10 years.  Flexible sigmoidoscopy or colonoscopy.** / Every 5 years for a flexible sigmoidoscopy or every 10 years for a colonoscopy beginning at age 50 and continuing until age 75.  Hepatitis C blood test.** / For all people born from 1945 through 1965 and any individual with known risks for hepatitis C.  Skin self-exam. / Monthly.  Influenza immunization.** / Every year.  Pneumococcal polysaccharide immunization.** / 1 to 2 doses if you smoke cigarettes or if you have certain chronic medical conditions.  Tetanus, diphtheria, pertussis (Tdap, Td) immunization.** / A one-time dose of Tdap vaccine. After that, you need a Td booster dose every 10 years.  Measles, mumps, rubella (MMR) immunization. / You need at least 1 dose of MMR if you were born in 1957 or later. You may also need a second dose.  Varicella immunization.** / Consult your caregiver.  Meningococcal immunization.** / Consult your caregiver.  Hepatitis A immunization.** / Consult your caregiver. 2 doses, 6 to 18 months apart.  Hepatitis B immunization.** / Consult your caregiver. 3 doses, usually over 6 months. Ages 65 and over  Blood pressure check.** / Every 1 to 2 years.  Lipid and cholesterol check.** / Every 5 years beginning at age 20.  Clinical breast exam.** / Every year after age 40.  Mammogram.** / Every year beginning at age 40 and continuing for as long as you are in good health. Consult with your caregiver.  Pap test.** / Every 3 years starting at age 30 through age 65 or 70 with a 3  consecutive normal Pap tests. Testing can be stopped between 65 and 70 with 3 consecutive normal Pap tests and no abnormal Pap or HPV tests in the past 10 years.  HPV screening.** / Every 3 years from ages 30 through ages 65 or 70 with a history of 3 consecutive normal Pap tests. Testing can be stopped between 65 and 70 with 3 consecutive normal Pap tests and no abnormal Pap or HPV tests in the past 10 years.  Fecal occult blood test (FOBT) of stool. / Every year beginning at age 50 and continuing until age 75. You may not need to do this test if you get a colonoscopy every 10 years.  Flexible sigmoidoscopy or colonoscopy.** / Every 5 years for a flexible sigmoidoscopy or every 10 years for a colonoscopy beginning at age 50 and continuing until age 75.  Hepatitis C blood test.** / For all people born from 1945 through 1965 and any individual with known risks for hepatitis C.  Osteoporosis screening.** / A one-time screening for women ages 65 and over and women at risk for fractures or osteoporosis.  Skin self-exam. / Monthly.  Influenza immunization.** / Every year.  Pneumococcal polysaccharide immunization.** / 1 dose at age 65 (or older) if you have never been vaccinated.  Tetanus, diphtheria, pertussis (Tdap, Td) immunization. / A one-time dose of Tdap vaccine if you are over   65 and have contact with an infant, are a healthcare worker, or simply want to be protected from whooping cough. After that, you need a Td booster dose every 10 years.  Varicella immunization.** / Consult your caregiver.  Meningococcal immunization.** / Consult your caregiver.  Hepatitis A immunization.** / Consult your caregiver. 2 doses, 6 to 18 months apart.  Hepatitis B immunization.** / Check with your caregiver. 3 doses, usually over 6 months. ** Family history and personal history of risk and conditions may change your caregiver's recommendations. Document Released: 12/29/2001 Document Revised: 01/25/2012  Document Reviewed: 03/30/2011 [employer] Patient Information 2013 [employer]  

## 2013-03-29 NOTE — Assessment & Plan Note (Signed)
Has hearing aids.

## 2013-03-29 NOTE — Progress Notes (Signed)
Subjective:    Wendy Velazquez is a 72 y.o. female who presents for Medicare Annual/Subsequent preventive examination.  Preventive Screening-Counseling & Management  Tobacco History  Smoking status  . Former Smoker -- 0.30 packs/day for 15 years  . Quit date: 03/15/1981  Smokeless tobacco  . Never Used     Problems Prior to Visit 1. none  Current Problems (verified) Patient Active Problem List   Diagnosis Date Noted  . Hx of cyst of breast 07/21/2012  . Left otitis media 06/16/2011  . CANDIDIASIS OF VULVA AND VAGINA 12/26/2010  . MELANOMA, FOOT, RIGHT 03/13/2010  . OTHER ACUTE REACTIONS TO STRESS 06/20/2008  . NEOPLASM, MALIGNANT, BLADDER, TRANSITIONAL CELL 03/02/2008  . HYPOTHYROIDISM 03/02/2008  . HEARING LOSS, BILATERAL 03/02/2008  . HYPERTENSION 03/02/2008  . POSTMENOPAUSAL STATUS 03/02/2008  . MELANOMA, LEG, HX OF 03/02/2008  . MRSA INFECTION 10/06/2007  . URI 09/23/2007  . DERMATITIS, CONTACT, NEC 07/28/2007  . ABSCESS, SKIN 07/20/2007  . Acute Sinusitis, Unspecified 05/19/2007    Medications Prior to Visit Current Outpatient Prescriptions on File Prior to Visit  Medication Sig Dispense Refill  . acetaminophen (TYLENOL ARTHRITIS PAIN) 650 MG CR tablet Take 650 mg by mouth at bedtime.        Marland Kitchen guaiFENesin (MUCINEX) 600 MG 12 hr tablet Take 600 mg by mouth at bedtime.        Marland Kitchen HYDROcodone-homatropine (HYCODAN) 5-1.5 MG/5ML syrup 1 tsp po qhs prn  120 mL  0  . Misc Natural Product Nasal (SIMILASAN NASAL SPRAY) SOLN Place 1 spray into both nostrils at bedtime.        . ranitidine (ZANTAC 75) 75 MG tablet Take 75 mg by mouth daily.         No current facility-administered medications on file prior to visit.    Current Medications (verified) Current Outpatient Prescriptions  Medication Sig Dispense Refill  . acetaminophen (TYLENOL ARTHRITIS PAIN) 650 MG CR tablet Take 650 mg by mouth at bedtime.        Marland Kitchen amLODipine (NORVASC) 5 MG tablet Take 1 tablet (5 mg  total) by mouth daily.  90 tablet  3  . augmented betamethasone dipropionate (DIPROLENE-AF) 0.05 % cream       . guaiFENesin (MUCINEX) 600 MG 12 hr tablet Take 600 mg by mouth at bedtime.        Marland Kitchen HYDROcodone-homatropine (HYCODAN) 5-1.5 MG/5ML syrup 1 tsp po qhs prn  120 mL  0  . medroxyPROGESTERone (PROVERA) 5 MG tablet Take 1 tablet (5 mg total) by mouth daily. 1/2 tab po qd 10 days a month as directed.  Brand name necessary.  90 tablet  0  . meloxicam (MOBIC) 15 MG tablet 1/2-1 by mouth once daily  90 tablet  3  . Misc Natural Product Nasal (SIMILASAN NASAL SPRAY) SOLN Place 1 spray into both nostrils at bedtime.        . propranolol (INDERAL) 10 MG tablet Take 1 tablet (10 mg total) by mouth 2 (two) times daily.  180 tablet  3  . ranitidine (ZANTAC 75) 75 MG tablet Take 75 mg by mouth daily.        Marland Kitchen spironolactone (ALDACTONE) 25 MG tablet 1/2 tab po qd  135 tablet  3  . SYNTHROID 75 MCG tablet Take 1 tablet (75 mcg total) by mouth daily. Brand name necessary  90 tablet  3  . estropipate (OGEN) 0.75 MG tablet Take 1 tablet (0.75 mg total) by mouth daily.  90 tablet  3  No current facility-administered medications for this visit.     Allergies (verified) Codeine   PAST HISTORY  Family History Family History  Problem Relation Age of Onset  . Diabetes Mother   . Alzheimer's disease Mother   . Dementia Mother   . Stroke Father   . Hypertension Father     Social History History  Substance Use Topics  . Smoking status: Former Smoker -- 0.30 packs/day for 15 years    Quit date: 03/15/1981  . Smokeless tobacco: Never Used  . Alcohol Use: Yes     Are there smokers in your home (other than you)? No  Risk Factors Current exercise habits: Home exercise routine includes dancing.  Dietary issues discussed: na   Cardiac risk factors: advanced age (older than 65 for men, 69 for women) and hypertension.  Depression Screen (Note: if answer to either of the following is "Yes", a  more complete depression screening is indicated)   Over the past two weeks, have you felt down, depressed or hopeless? No  Over the past two weeks, have you felt little interest or pleasure in doing things? No  Have you lost interest or pleasure in daily life? No  Do you often feel hopeless? No  Do you cry easily over simple problems? No  Activities of Daily Living In your present state of health, do you have any difficulty performing the following activities?:  Driving? No Managing money?  No Feeding yourself? No Getting from bed to chair? No Climbing a flight of stairs? No Preparing food and eating?: No Bathing or showering? No Getting dressed: No Getting to the toilet? No Using the toilet:No Moving around from place to place: No In the past year have you fallen or had a near fall?:No   Are you sexually active?  No  Do you have more than one partner?  No  Hearing Difficulties: No Do you often ask people to speak up or repeat themselves? No Do you experience ringing or noises in your ears? No Do you have difficulty understanding soft or whispered voices? No   Do you feel that you have a problem with memory? No  Do you often misplace items? No  Do you feel safe at home?  No  Cognitive Testing  Alert? Yes  Normal Appearance?Yes  Oriented to person? Yes  Place? Yes   Time? Yes  Recall of three objects?  Yes  Can perform simple calculations? Yes  Displays appropriate judgment?Yes  Can read the correct time from a watch face?Yes   Advanced Directives have been discussed with the patient? Yes  List the Names of Other Physician/Practitioners you currently use: 1.  opth-- Wendy Velazquez 2  Dentist-- Wendy Velazquez 3  Derm-- Wendy Velazquez 4  uro-- Wendy Velazquez  Indicate any recent Medical Services you may have received from other than [facility] providers in the past year (date may be approximate).  Immunization History  Administered Date(s) Administered  . Influenza Whole 09/12/2008  . Pneumococcal  Polysaccharide 03/02/2008  . Td 11/29/2002  . Zoster 03/02/2008    Screening Tests Health Maintenance  Topic Date Due  . Tetanus/tdap  11/29/2012  . Influenza Vaccine  07/17/2013  . Colonoscopy  06/01/2017  . Pneumococcal Polysaccharide Vaccine Age 6 And Over  Completed  . Zostavax  Completed    All answers were reviewed with the patient and necessary referrals were made:  Loreen Freud, DO   03/29/2013   History reviewed:  She  has a past medical history of Hypertension; Arthritis; MRSA (  methicillin resistant staph aureus) culture positive; Rapid heart beat; Osteopenia; Transitional cell carcinoma; and Melanoma. She  does not have any pertinent problems on file. She  has past surgical history that includes Tonsillectomy; Abdominal hysterectomy (1987); Melanoma excision (1977); Melanoma excision (06-24-09); Cataract extraction (2004); Skin cancer excision (1999); Eye surgery; and teeth implants. Her family history includes Alzheimer's disease in her mother; Dementia in her mother; Diabetes in her mother; Hypertension in her father; and Stroke in her father. She  reports that she quit smoking about 32 years ago. She has never used smokeless tobacco. She reports that  drinks alcohol. She reports that she does not use illicit drugs. She has a current medication list which includes the following prescription(s): acetaminophen, amlodipine, augmented betamethasone dipropionate, guaifenesin, hydrocodone-homatropine, medroxyprogesterone, meloxicam, similasan nasal spray, propranolol, ranitidine, spironolactone, synthroid, and estropipate. Current Outpatient Prescriptions on File Prior to Visit  Medication Sig Dispense Refill  . acetaminophen (TYLENOL ARTHRITIS PAIN) 650 MG CR tablet Take 650 mg by mouth at bedtime.        Marland Kitchen guaiFENesin (MUCINEX) 600 MG 12 hr tablet Take 600 mg by mouth at bedtime.        Marland Kitchen HYDROcodone-homatropine (HYCODAN) 5-1.5 MG/5ML syrup 1 tsp po qhs prn  120 mL  0  . Misc  Natural Product Nasal (SIMILASAN NASAL SPRAY) SOLN Place 1 spray into both nostrils at bedtime.        . ranitidine (ZANTAC 75) 75 MG tablet Take 75 mg by mouth daily.         No current facility-administered medications on file prior to visit.   She is allergic to codeine.  Review of Systems  Review of Systems  Constitutional: Negative for activity change, appetite change and fatigue.  HENT: Negative for hearing loss, congestion, tinnitus and ear discharge.   Eyes: Negative for visual disturbance (see optho q1y -- vision corrected to 20/20 with glasses).  Respiratory: Negative for cough, chest tightness and shortness of breath.   Cardiovascular: Negative for chest pain, palpitations and leg swelling.  Gastrointestinal: Negative for abdominal pain, diarrhea, constipation and abdominal distention.  Genitourinary: Negative for urgency, frequency, decreased urine volume and difficulty urinating.  Musculoskeletal: Negative for back pain, arthralgias and gait problem.  Skin: Negative for color change, pallor and rash.  Neurological: Negative for dizziness, light-headedness, numbness and headaches.  Hematological: Negative for adenopathy. Does not bruise/bleed easily.  Psychiatric/Behavioral: Negative for suicidal ideas, confusion, sleep disturbance, self-injury, dysphoric mood, decreased concentration and agitation.  Pt is able to read and write and can do all ADLs No risk for falling No abuse/ violence in home     Objective:     Vision by Snellen chart: opth  Body mass index is 32.74 kg/(m^2). BP 124/71  Pulse 61  Temp(Src) 98.2 F (36.8 C) (Oral)  Ht 5\' 3"  (1.6 m)  Wt 184 lb 12.8 oz (83.825 kg)  BMI 32.74 kg/m2  SpO2 97%  BP 124/71  Pulse 61  Temp(Src) 98.2 F (36.8 C) (Oral)  Ht 5\' 3"  (1.6 m)  Wt 184 lb 12.8 oz (83.825 kg)  BMI 32.74 kg/m2  SpO2 97% General appearance: alert, cooperative, appears stated age and no distress Head: Normocephalic, without obvious  abnormality, atraumatic Eyes: negative findings: lids and lashes normal, corneas clear and pupils equal, round, reactive to light and accomodation Ears: normal TM's and external ear canals both ears Nose: Nares normal. Septum midline. Mucosa normal. No drainage or sinus tenderness. Throat: lips, mucosa, and tongue normal; teeth and gums normal  Neck: no adenopathy, supple, symmetrical, trachea midline and thyroid not enlarged, symmetric, no tenderness/mass/nodules Back: symmetric, no curvature. ROM normal. No CVA tenderness. Lungs: clear to auscultation bilaterally Breasts: normal appearance, no masses or tenderness Heart: S1, S2 normal Abdomen: soft, non-tender; bowel sounds normal; no masses,  no organomegaly Pelvic: not indicated; status post hysterectomy, negative ROS Extremities: extremities normal, atraumatic, no cyanosis or edema Pulses: 2+ and symmetric Skin: Skin color, texture, turgor normal. No rashes or lesions Lymph nodes: Cervical, supraclavicular, and axillary nodes normal. Neurologic: Alert and oriented X 3, normal strength and tone. Normal symmetric reflexes. Normal coordination and gait .psych-- no depression, no anxiety      Assessment:     cpe      Plan:     During the course of the visit the patient was educated and counseled about appropriate screening and preventive services including:    Pneumococcal vaccine   Influenza vaccine  Hepatitis B vaccine  Td vaccine  Screening mammography  Screening Pap smear and pelvic exam   Bone densitometry screening  Colorectal cancer screening  Glaucoma screening  Advanced directives: has an advanced directive - a copy HAS NOT been provided.  Diet review for nutrition referral? Yes ____  Not Indicated _x___   Patient Instructions (the written plan) was given to the patient.  Medicare Attestation I have personally reviewed: The patient's medical and social history Their use of alcohol, tobacco or  illicit drugs Their current medications and supplements The patient's functional ability including ADLs,fall risks, home safety risks, cognitive, and hearing and visual impairment Diet and physical activities Evidence for depression or mood disorders  The patient's weight, height, BMI, and visual acuity have been recorded in the chart.  I have made referrals, counseling, and provided education to the patient based on review of the above and I have provided the patient with a written personalized care plan for preventive services.     Loreen Freud, DO   03/29/2013

## 2013-03-29 NOTE — Assessment & Plan Note (Signed)
Stable con't meds 

## 2013-04-04 DIAGNOSIS — M2060 Acquired deformities of toe(s), unspecified, unspecified foot: Secondary | ICD-10-CM | POA: Diagnosis not present

## 2013-04-05 ENCOUNTER — Telehealth: Payer: Self-pay | Admitting: General Practice

## 2013-04-05 NOTE — Telephone Encounter (Signed)
Pt called stating that her mail order company is having issues filling her Provera. Received a fax today for [place] to sign stating that pt needs Brand name.

## 2013-04-05 NOTE — Telephone Encounter (Signed)
Form faxed back.

## 2013-04-07 MED ORDER — MEDROXYPROGESTERONE ACETATE 5 MG PO TABS: ORAL_TABLET | ORAL | Status: DC

## 2013-04-07 NOTE — Addendum Note (Signed)
Addended by: Jackson Latino on: 04/07/2013 11:31 AM   Modules accepted: Orders

## 2013-04-07 NOTE — Telephone Encounter (Signed)
Med refilled per [facility].

## 2013-04-21 DIAGNOSIS — M549 Dorsalgia, unspecified: Secondary | ICD-10-CM | POA: Diagnosis not present

## 2013-04-21 DIAGNOSIS — M79609 Pain in unspecified limb: Secondary | ICD-10-CM | POA: Diagnosis not present

## 2013-05-12 ENCOUNTER — Other Ambulatory Visit: Payer: Self-pay

## 2013-05-12 DIAGNOSIS — Z85820 Personal history of malignant melanoma of skin: Secondary | ICD-10-CM | POA: Diagnosis not present

## 2013-05-12 DIAGNOSIS — D2360 Other benign neoplasm of skin of unspecified upper limb, including shoulder: Secondary | ICD-10-CM | POA: Diagnosis not present

## 2013-05-24 ENCOUNTER — Telehealth: Payer: Self-pay | Admitting: Family Medicine

## 2013-05-24 NOTE — Telephone Encounter (Signed)
Patient wants to know if she can come in for a Tetanus injection. She says Medicare wont pay for it but her supplement policy [employer] will cover it.

## 2013-05-24 NOTE — Telephone Encounter (Signed)
Please advise      KP 

## 2013-05-25 NOTE — Telephone Encounter (Signed)
If she knows for sure they will pay then that is fine

## 2013-06-05 DIAGNOSIS — Z23 Encounter for immunization: Secondary | ICD-10-CM | POA: Diagnosis not present

## 2013-06-06 ENCOUNTER — Encounter: Payer: Self-pay | Admitting: Family Medicine

## 2013-06-08 DIAGNOSIS — Z8551 Personal history of malignant neoplasm of bladder: Secondary | ICD-10-CM | POA: Diagnosis not present

## 2013-09-21 ENCOUNTER — Other Ambulatory Visit: Payer: Self-pay

## 2013-09-24 DIAGNOSIS — Z23 Encounter for immunization: Secondary | ICD-10-CM | POA: Diagnosis not present

## 2013-10-05 DIAGNOSIS — L819 Disorder of pigmentation, unspecified: Secondary | ICD-10-CM | POA: Diagnosis not present

## 2013-10-05 DIAGNOSIS — L821 Other seborrheic keratosis: Secondary | ICD-10-CM | POA: Diagnosis not present

## 2013-10-05 DIAGNOSIS — D239 Other benign neoplasm of skin, unspecified: Secondary | ICD-10-CM | POA: Diagnosis not present

## 2013-10-05 DIAGNOSIS — Z85820 Personal history of malignant melanoma of skin: Secondary | ICD-10-CM | POA: Diagnosis not present

## 2013-10-05 DIAGNOSIS — D1801 Hemangioma of skin and subcutaneous tissue: Secondary | ICD-10-CM | POA: Diagnosis not present

## 2013-11-11 ENCOUNTER — Encounter: Payer: Self-pay | Admitting: Family Medicine

## 2013-11-11 ENCOUNTER — Ambulatory Visit (INDEPENDENT_AMBULATORY_CARE_PROVIDER_SITE_OTHER): Payer: Medicare Other | Admitting: Family Medicine

## 2013-11-11 VITALS — BP 138/90 | Temp 97.5°F | Wt 183.0 lb

## 2013-11-11 DIAGNOSIS — J019 Acute sinusitis, unspecified: Secondary | ICD-10-CM

## 2013-11-11 MED ORDER — AZITHROMYCIN 250 MG PO TABS: ORAL_TABLET | ORAL | Status: DC

## 2013-11-11 NOTE — Progress Notes (Signed)
Pre visit review using our clinic review tool, if applicable. No additional management support is needed unless otherwise documented below in the visit note. 

## 2013-11-11 NOTE — Progress Notes (Signed)
   Subjective:    Patient ID: Wendy Velazquez, female    DOB: [birth year], 72 y.o.   MRN: [id number]  HPI Here for 5 days of sinus pressure. HA, PND, ST and a dry cough. No fever.    Review of Systems  Constitutional: Negative.   HENT: Positive for congestion, postnasal drip and sinus pressure.   Eyes: Negative.   Respiratory: Positive for cough.        Objective:   Physical Exam  Constitutional: She appears well-developed and well-nourished. No distress.  HENT:  Right Ear: External ear normal.  Left Ear: External ear normal.  Nose: Nose normal.  Mouth/Throat: Oropharynx is clear and moist.  Eyes: Conjunctivae are normal.  Pulmonary/Chest: Effort normal and breath sounds normal.          Assessment & Plan:  Add Mucinex

## 2013-11-15 DIAGNOSIS — Z961 Presence of intraocular lens: Secondary | ICD-10-CM | POA: Diagnosis not present

## 2013-11-15 DIAGNOSIS — H04229 Epiphora due to insufficient drainage, unspecified lacrimal gland: Secondary | ICD-10-CM | POA: Diagnosis not present

## 2013-11-15 DIAGNOSIS — H524 Presbyopia: Secondary | ICD-10-CM | POA: Diagnosis not present

## 2013-12-18 ENCOUNTER — Ambulatory Visit (INDEPENDENT_AMBULATORY_CARE_PROVIDER_SITE_OTHER): Payer: Medicare Other | Admitting: Family Medicine

## 2013-12-18 ENCOUNTER — Encounter: Payer: Self-pay | Admitting: Family Medicine

## 2013-12-18 VITALS — BP 142/82 | HR 65 | Temp 98.2°F | Wt 183.0 lb

## 2013-12-18 DIAGNOSIS — H6501 Acute serous otitis media, right ear: Secondary | ICD-10-CM

## 2013-12-18 DIAGNOSIS — H6500 Acute serous otitis media, unspecified ear: Secondary | ICD-10-CM | POA: Diagnosis not present

## 2013-12-18 MED ORDER — FLUTICASONE PROPIONATE 50 MCG/ACT NA SUSP: 2.0000 | Freq: Every day | NASAL | Status: DC

## 2013-12-18 MED ORDER — CEFUROXIME AXETIL 500 MG PO TABS: 500.0000 mg | ORAL_TABLET | Freq: Two times a day (BID) | ORAL | Status: AC

## 2013-12-18 NOTE — Patient Instructions (Signed)
Serous Otitis Media  Serous otitis media is fluid in the middle ear space. This space contains the bones for hearing and air. Air in the middle ear space helps to transmit sound.  The air gets there through the eustachian tube. This tube goes from the back of the nose (nasopharynx) to the middle ear space. It keeps the pressure in the middle ear the same as the outside world. It also helps to drain fluid from the middle ear space. CAUSES  Serous otitis media occurs when the eustachian tube gets blocked. Blockage can come from:  Ear infections.  Colds and other upper respiratory infections.  Allergies.  Irritants such as cigarette smoke.  Sudden changes in air pressure (such as descending in an airplane).  Enlarged adenoids.  A mass in the nasopharynx. During colds and upper respiratory infections, the middle ear space can become temporarily filled with fluid. This can happen after an ear infection also. Once the infection clears, the fluid will generally drain out of the ear through the eustachian tube. If it does not, then serous otitis media occurs. SIGNS AND SYMPTOMS   Hearing loss.  A feeling of fullness in the ear, without pain.  Young children may not show any symptoms but may show slight behavioral changes, such as agitation, ear pulling, or crying. DIAGNOSIS  Serous otitis media is diagnosed by an ear exam. Tests may be done to check on the movement of the eardrum. Hearing exams may also be done. TREATMENT  The fluid most often goes away without treatment. If allergy is the cause, allergy treatment may be helpful. Fluid that persists for several months may require minor surgery. A small tube is placed in the eardrum to:  Drain the fluid.  Restore the air in the middle ear space. In certain situations, antibiotics are used to avoid surgery. Surgery may be done to remove enlarged adenoids (if this is the cause). HOME CARE INSTRUCTIONS   Keep children away from tobacco  smoke.  Be sure to keep any follow-up appointments. SEEK MEDICAL CARE IF:   Your hearing is not better in 3 months.  Your hearing is worse.  You have ear pain.  You have drainage from the ear.  You have dizziness.  You have serous otitis media only in one ear or have any bleeding from your nose (epistaxis).  You notice a lump on your neck. MAKE SURE YOU:  Understand these instructions.   Will watch your condition.   Will get help right away if you are not doing well or get worse.  Document Released: 01/23/2004 Document Revised: 07/05/2013 Document Reviewed: 05/30/2013 [employer] Patient Information 2014 [employer].  

## 2013-12-18 NOTE — Progress Notes (Signed)
Pre visit review using our clinic review tool, if applicable. No additional management support is needed unless otherwise documented below in the visit note. 

## 2013-12-18 NOTE — Progress Notes (Signed)
   Subjective:    Patient ID: Wendy Velazquez, female    DOB: [birth year], 73 y.o.   MRN: [id number]  HPI Pt here c/o ringing in ears since she finished erythromycin rx 11/11/2013.  Pt was also given alavert.  She also had vomiting Tuesday that ended at midnight that night.  Pt now c/o weakness and fatigue with occassional dizziness.  Pt has some sinus drainage but no more than usual.      Review of Systems     Objective:   Physical Exam        Assessment & Plan:   Subjective:     Wendy Velazquez is a 73 y.o. female who presents with ear pain and possible ear infection. Symptoms include: plugged sensation in both ears and ringing in ears. Onset of symptoms was several weeks ago, and have been gradually worsening since that time. Associated symptoms include: sinus pressure and dizziness.  Patient denies: congestion and post nasal drip. She is drinking plenty of fluids.  The following portions of the patient's history were reviewed and updated as appropriate: allergies, current medications, past family history, past medical history, past social history, past surgical history and problem list.  Review of Systems Pertinent items are noted in HPI.   Objective:    BP 142/82  Pulse 65  Temp(Src) 98.2 F (36.8 C) (Oral)  Wt 183 lb (83.008 kg)  SpO2 97% General:  alert, cooperative, appears stated age and no distress  Right Ear: diminished mobility, dull  Left Ear: normal  Mouth:  abnormal findings: mild oropharyngeal erythema  Neck: no adenopathy, supple, symmetrical, trachea midline and thyroid not enlarged, symmetric, no tenderness/mass/nodules     Assessment:    Right acute otitis media   Plan:    Treatment: ceftin. OTC analgesia as needed. Fluids, rest, avoid carbonated/alcoholic and caffeinated beverages.  Follow up in a few weeks if not improving. --refer to ENT if no better

## 2013-12-25 ENCOUNTER — Other Ambulatory Visit: Payer: Self-pay

## 2013-12-25 DIAGNOSIS — Z1231 Encounter for screening mammogram for malignant neoplasm of breast: Secondary | ICD-10-CM

## 2013-12-28 ENCOUNTER — Telehealth: Payer: Self-pay | Admitting: *Deleted

## 2013-12-28 NOTE — Telephone Encounter (Signed)
Pt states was seen Monday for fluid behind eardrum and ringing in ears. Pt feels much better but still hs ringing in the ears, wants to know if she needs to come in or be referred. Contact back info- [phone].

## 2013-12-29 NOTE — Telephone Encounter (Signed)
Appt made for Monday 01/01/14 at 1:45

## 2014-01-01 ENCOUNTER — Ambulatory Visit (INDEPENDENT_AMBULATORY_CARE_PROVIDER_SITE_OTHER): Payer: Medicare Other | Admitting: Family Medicine

## 2014-01-01 ENCOUNTER — Encounter: Payer: Self-pay | Admitting: Family Medicine

## 2014-01-01 VITALS — BP 134/82 | HR 79 | Temp 98.2°F | Wt 187.0 lb

## 2014-01-01 DIAGNOSIS — B354 Tinea corporis: Secondary | ICD-10-CM

## 2014-01-01 DIAGNOSIS — H9319 Tinnitus, unspecified ear: Secondary | ICD-10-CM

## 2014-01-01 DIAGNOSIS — H9313 Tinnitus, bilateral: Secondary | ICD-10-CM

## 2014-01-01 DIAGNOSIS — E669 Obesity, unspecified: Secondary | ICD-10-CM | POA: Insufficient documentation

## 2014-01-01 DIAGNOSIS — H6500 Acute serous otitis media, unspecified ear: Secondary | ICD-10-CM | POA: Diagnosis not present

## 2014-01-01 MED ORDER — PREDNISONE 10 MG PO TABS: ORAL_TABLET | ORAL | Status: DC

## 2014-01-01 MED ORDER — NAFTIFINE HCL 1 % EX CREA: TOPICAL_CREAM | Freq: Every day | CUTANEOUS | Status: DC

## 2014-01-01 MED ORDER — LORATADINE 10 MG PO TABS: 10.0000 mg | ORAL_TABLET | Freq: Every day | ORAL | Status: DC

## 2014-01-01 NOTE — Patient Instructions (Signed)
Tinnitus  Sounds you hear in your ears and coming from within the ear is called tinnitus. This can be a symptom of many ear disorders. It is often associated with hearing loss.   Tinnitus can be seen with:  · Infections.  · Ear blockages such as wax buildup.  · Meniere's disease.  · Ear damage.  · Inherited.  · Occupational causes.  While irritating, it is not usually a threat to health. When the cause of the tinnitus is wax, infection in the middle ear, or foreign body it is easily treated. Hearing loss will usually be reversible.   TREATMENT   When treating the underlying cause does not get rid of tinnitus, it may be necessary to get rid of the unwanted sound by covering it up with more pleasant background noises. This may include music, the radio etc. There are tinnitus maskers which can be worn which produce background noise to cover up the tinnitus.  Avoid all medications which tend to make tinnitus worse such as alcohol, caffeine, aspirin, and nicotine. There are many soothing background tapes such as rain, ocean, thunderstorms, etc. These soothing sounds help with sleeping or resting.  Keep all follow-up appointments and referrals. This is important to identify the cause of the problem. It also helps avoid complications, impaired hearing, disability, or chronic pain.  Document Released: 11/02/2005 Document Revised: 01/25/2012 Document Reviewed: 06/20/2008  [employer]® Patient Information ©2014 [employer].

## 2014-01-01 NOTE — Progress Notes (Signed)
Patient ID: Wendy Velazquez, female   DOB: [birth year], 73 y.o.   MRN: [id number]   Subjective:    Patient ID: Wendy Velazquez, female    DOB: 21-Jun-1941, 73 y.o.   MRN: [id number] HPI Pt here c/o ears con't have pressure and ringing b/l.  Pt has no relief.   See previous ov.            Objective:    BP 134/82  Pulse 79  Temp(Src) 98.2 F (36.8 C) (Oral)  Wt 187 lb (84.823 kg)  SpO2 95% General appearance: alert, cooperative, appears stated age and no distress Head: Normocephalic, without obvious abnormality, atraumatic Ears: b/l fluid  Nose: Nares normal. Septum midline. Mucosa normal. No drainage or sinus tenderness. Throat: lips, mucosa, and tongue normal; teeth and gums normal Neck: no adenopathy, supple, symmetrical, trachea midline and thyroid not enlarged, symmetric, no tenderness/mass/nodules Lungs: clear to auscultation bilaterally        Assessment & Plan:  1. Tinnitus of both ears  - Ambulatory referral to ENT - predniSONE (DELTASONE) 10 MG tablet; 3 po qd for 3 days then 2 po qd for 3 days the 1 po qd for 3 days  Dispense: 18 tablet; Refill: 0  2. Acute serous otitis media Add pred taper to flonase and antihistamine - loratadine (CLARITIN) 10 MG tablet; Take 1 tablet (10 mg total) by mouth daily.  Dispense: 30 tablet; Refill: 11 - predniSONE (DELTASONE) 10 MG tablet; 3 po qd for 3 days then 2 po qd for 3 days the 1 po qd for 3 days  Dispense: 18 tablet; Refill: 0  3. Tinea corporis  - naftifine (NAFTIN) 1 % cream; Apply topically daily.  Dispense: 30 g; Refill: 0

## 2014-01-08 DIAGNOSIS — H9190 Unspecified hearing loss, unspecified ear: Secondary | ICD-10-CM | POA: Diagnosis not present

## 2014-01-08 DIAGNOSIS — J019 Acute sinusitis, unspecified: Secondary | ICD-10-CM | POA: Diagnosis not present

## 2014-01-08 DIAGNOSIS — J310 Chronic rhinitis: Secondary | ICD-10-CM | POA: Diagnosis not present

## 2014-01-08 DIAGNOSIS — H9319 Tinnitus, unspecified ear: Secondary | ICD-10-CM | POA: Diagnosis not present

## 2014-01-08 DIAGNOSIS — H905 Unspecified sensorineural hearing loss: Secondary | ICD-10-CM | POA: Diagnosis not present

## 2014-01-08 DIAGNOSIS — H6120 Impacted cerumen, unspecified ear: Secondary | ICD-10-CM | POA: Diagnosis not present

## 2014-02-12 ENCOUNTER — Ambulatory Visit
Admission: RE | Admit: 2014-02-12 | Discharge: 2014-02-12 | Disposition: A | Discharge status: Home / Self Care | Payer: Medicare Other | Source: Ambulatory Visit

## 2014-02-12 DIAGNOSIS — Z1231 Encounter for screening mammogram for malignant neoplasm of breast: Secondary | ICD-10-CM

## 2014-04-02 ENCOUNTER — Telehealth: Payer: Self-pay

## 2014-04-02 NOTE — Telephone Encounter (Signed)
Patient confirmed States that she is aware that she has a physical.

## 2014-04-03 ENCOUNTER — Ambulatory Visit (INDEPENDENT_AMBULATORY_CARE_PROVIDER_SITE_OTHER): Payer: Medicare Other | Admitting: Family Medicine

## 2014-04-03 ENCOUNTER — Encounter: Payer: Self-pay | Admitting: Family Medicine

## 2014-04-03 VITALS — BP 126/78 | HR 64 | Temp 98.0°F | Ht 63.0 in | Wt 182.0 lb

## 2014-04-03 DIAGNOSIS — E039 Hypothyroidism, unspecified: Secondary | ICD-10-CM

## 2014-04-03 DIAGNOSIS — Z780 Asymptomatic menopausal state: Secondary | ICD-10-CM

## 2014-04-03 DIAGNOSIS — M129 Arthropathy, unspecified: Secondary | ICD-10-CM

## 2014-04-03 DIAGNOSIS — Z0000 Encounter for general adult medical examination without abnormal findings: Secondary | ICD-10-CM | POA: Diagnosis not present

## 2014-04-03 DIAGNOSIS — R319 Hematuria, unspecified: Secondary | ICD-10-CM | POA: Diagnosis not present

## 2014-04-03 DIAGNOSIS — M1990 Unspecified osteoarthritis, unspecified site: Secondary | ICD-10-CM

## 2014-04-03 DIAGNOSIS — I10 Essential (primary) hypertension: Secondary | ICD-10-CM | POA: Diagnosis not present

## 2014-04-03 LAB — POCT URINALYSIS DIPSTICK
Bilirubin, UA: NEGATIVE
GLUCOSE UA: NEGATIVE
Ketones, UA: NEGATIVE
LEUKOCYTES UA: NEGATIVE
Nitrite, UA: NEGATIVE
Protein, UA: NEGATIVE
SPEC GRAV UA: 1.015
UROBILINOGEN UA: 0.2
pH, UA: 5

## 2014-04-03 LAB — BASIC METABOLIC PANEL
BUN: 18 mg/dL (ref 6–23)
CALCIUM: 9.5 mg/dL (ref 8.4–10.5)
CO2: 28 mEq/L (ref 19–32)
Chloride: 102 mEq/L (ref 96–112)
Creatinine, Ser: 1 mg/dL (ref 0.4–1.2)
GFR: 60.75 mL/min (ref >60.00)
Glucose, Bld: 103 mg/dL — ABNORMAL HIGH (ref 70–99)
POTASSIUM: 4.6 meq/L (ref 3.5–5.1)
SODIUM: 139 meq/L (ref 135–145)

## 2014-04-03 LAB — CBC WITH DIFFERENTIAL/PLATELET
Basophils Absolute: 0 10*3/uL (ref 0.0–0.1)
Basophils Relative: 0.3 % (ref 0.0–3.0)
EOS PCT: 2.1 % (ref 0.0–5.0)
Eosinophils Absolute: 0.2 10*3/uL (ref 0.0–0.7)
HCT: 44.6 % (ref 36.0–46.0)
HEMOGLOBIN: 15 g/dL (ref 12.0–15.0)
LYMPHS ABS: 2 10*3/uL (ref 0.7–4.0)
LYMPHS PCT: 26.3 % (ref 12.0–46.0)
MCHC: 33.6 g/dL (ref 30.0–36.0)
MCV: 92.2 fl (ref 78.0–100.0)
MONOS PCT: 4.9 % (ref 3.0–12.0)
Monocytes Absolute: 0.4 10*3/uL (ref 0.1–1.0)
NEUTROS ABS: 5.1 10*3/uL (ref 1.4–7.7)
Neutrophils Relative %: 66.4 % (ref 43.0–77.0)
Platelets: 220 10*3/uL (ref 150.0–400.0)
RBC: 4.84 Mil/uL (ref 3.87–5.11)
RDW: 13 % (ref 11.5–15.5)
WBC: 7.7 10*3/uL (ref 4.0–10.5)

## 2014-04-03 LAB — LIPID PANEL
CHOLESTEROL: 165 mg/dL (ref 0–200)
HDL: 39.4 mg/dL (ref >39.00)
LDL Cholesterol: 77 mg/dL (ref 0–99)
Total CHOL/HDL Ratio: 4
Triglycerides: 244 mg/dL — ABNORMAL HIGH (ref 0.0–149.0)
VLDL: 48.8 mg/dL — ABNORMAL HIGH (ref 0.0–40.0)

## 2014-04-03 LAB — HEPATIC FUNCTION PANEL
ALK PHOS: 70 U/L (ref 39–117)
ALT: 20 U/L (ref 0–35)
AST: 22 U/L (ref 0–37)
Albumin: 4.4 g/dL (ref 3.5–5.2)
BILIRUBIN TOTAL: 0.8 mg/dL (ref 0.2–1.2)
Bilirubin, Direct: 0.1 mg/dL (ref 0.0–0.3)
Total Protein: 6.9 g/dL (ref 6.0–8.3)

## 2014-04-03 LAB — TSH: TSH: 0.9 u[IU]/mL (ref 0.35–4.50)

## 2014-04-03 MED ORDER — AMLODIPINE BESYLATE 5 MG PO TABS: 5.0000 mg | ORAL_TABLET | Freq: Every day | ORAL | Status: DC

## 2014-04-03 MED ORDER — SYNTHROID 75 MCG PO TABS: 75.0000 ug | ORAL_TABLET | Freq: Every day | ORAL | Status: DC

## 2014-04-03 MED ORDER — MELOXICAM 15 MG PO TABS: ORAL_TABLET | ORAL | Status: DC

## 2014-04-03 MED ORDER — PROPRANOLOL HCL 10 MG PO TABS: 10.0000 mg | ORAL_TABLET | Freq: Two times a day (BID) | ORAL | Status: DC

## 2014-04-03 MED ORDER — SPIRONOLACTONE 25 MG PO TABS: ORAL_TABLET | ORAL | Status: DC

## 2014-04-03 MED ORDER — MEDROXYPROGESTERONE ACETATE 5 MG PO TABS: ORAL_TABLET | ORAL | Status: DC

## 2014-04-03 MED ORDER — ESTROPIPATE 0.75 MG PO TABS: 0.7500 mg | ORAL_TABLET | Freq: Every day | ORAL | Status: DC

## 2014-04-03 NOTE — Progress Notes (Signed)
Pre visit review using our clinic review tool, if applicable. No additional management support is needed unless otherwise documented below in the visit note. 

## 2014-04-03 NOTE — Progress Notes (Signed)
Subjective:    Wendy Velazquez is a 73 y.o. female who presents for Medicare Annual/Subsequent preventive examination.  Preventive Screening-Counseling & Management  Tobacco History  Smoking status  . Former Smoker -- 0.30 packs/day for 15 years  . Quit date: 03/15/1981  Smokeless tobacco  . Never Used     Problems Prior to Visit 1. none  Current Problems (verified) Patient Active Problem List   Diagnosis Date Noted  . Obesity (BMI 30-39.9) 01/01/2014  . Hx of cyst of breast 07/21/2012  . MELANOMA, FOOT, RIGHT 03/13/2010  . OTHER ACUTE REACTIONS TO STRESS 06/20/2008  . NEOPLASM, MALIGNANT, BLADDER, TRANSITIONAL CELL 03/02/2008  . HYPOTHYROIDISM 03/02/2008  . HEARING LOSS, BILATERAL 03/02/2008  . HYPERTENSION 03/02/2008  . POSTMENOPAUSAL STATUS 03/02/2008  . MELANOMA, LEG, HX OF 03/02/2008  . URI 09/23/2007  . DERMATITIS, CONTACT, NEC 07/28/2007  . ABSCESS, SKIN 07/20/2007  . Acute Sinusitis, Unspecified 05/19/2007    Medications Prior to Visit Current Outpatient Prescriptions on File Prior to Visit  Medication Sig Dispense Refill  . acetaminophen (TYLENOL ARTHRITIS PAIN) 650 MG CR tablet Take 650 mg by mouth at bedtime.        . fluticasone (FLONASE) 50 MCG/ACT nasal spray Place 2 sprays into both nostrils daily.  16 g  6  . Misc Natural Product Nasal (SIMILASAN NASAL SPRAY) SOLN Place 1 spray into both nostrils at bedtime.        . naftifine (NAFTIN) 1 % cream Apply topically daily.  30 g  0  . ranitidine (ZANTAC 75) 75 MG tablet Take 75 mg by mouth daily.        . Vitamins-Lipotropics (LIPO-FLAVONOID PLUS) TABS Take by mouth.       No current facility-administered medications on file prior to visit.    Current Medications (verified) Current Outpatient Prescriptions  Medication Sig Dispense Refill  . acetaminophen (TYLENOL ARTHRITIS PAIN) 650 MG CR tablet Take 650 mg by mouth at bedtime.        Marland Kitchen. amLODipine (NORVASC) 5 MG tablet Take 1 tablet (5 mg total) by  mouth daily.  90 tablet  3  . estropipate (OGEN) 0.75 MG tablet Take 1 tablet (0.75 mg total) by mouth daily.  90 tablet  3  . fluticasone (FLONASE) 50 MCG/ACT nasal spray Place 2 sprays into both nostrils daily.  16 g  6  . medroxyPROGESTERone (PROVERA) 5 MG tablet 1/2 tab po qd 10 days a month as directed.  Brand name necessary.  90 tablet  0  . meloxicam (MOBIC) 15 MG tablet 1/2-1 by mouth once daily  90 tablet  3  . Misc Natural Product Nasal (SIMILASAN NASAL SPRAY) SOLN Place 1 spray into both nostrils at bedtime.        . naftifine (NAFTIN) 1 % cream Apply topically daily.  30 g  0  . propranolol (INDERAL) 10 MG tablet Take 1 tablet (10 mg total) by mouth 2 (two) times daily.  180 tablet  3  . ranitidine (ZANTAC 75) 75 MG tablet Take 75 mg by mouth daily.        Marland Kitchen. spironolactone (ALDACTONE) 25 MG tablet 1/2 tab po qd  135 tablet  3  . SYNTHROID 75 MCG tablet Take 1 tablet (75 mcg total) by mouth daily. Brand name necessary  90 tablet  3  . Vitamins-Lipotropics (LIPO-FLAVONOID PLUS) TABS Take by mouth.       No current facility-administered medications for this visit.     Allergies (verified) Codeine  PAST HISTORY  Family History Family History  Problem Relation Age of Onset  . Diabetes Mother   . Alzheimer's disease Mother   . Dementia Mother   . Stroke Father   . Hypertension Father     Social History History  Substance Use Topics  . Smoking status: Former Smoker -- 0.30 packs/day for 15 years    Quit date: 03/15/1981  . Smokeless tobacco: Never Used  . Alcohol Use: Yes     Are there smokers in your home (other than you)? No  Risk Factors Current exercise habits: 2x a week  Dietary issues discussed: na   Cardiac risk factors: advanced age (older than 24 for men, 68 for women) and hypertension.  Depression Screen (Note: if answer to either of the following is "Yes", a more complete depression screening is indicated)   Over the past two weeks, have you felt  down, depressed or hopeless? No  Over the past two weeks, have you felt little interest or pleasure in doing things? No  Have you lost interest or pleasure in daily life? No  Do you often feel hopeless? No  Do you cry easily over simple problems? No  Activities of Daily Living In your present state of health, do you have any difficulty performing the following activities?:  Driving? No Managing money?  No Feeding yourself? No Getting from bed to chair? No Climbing a flight of stairs? No Preparing food and eating?: No Bathing or showering? No Getting dressed: No Getting to the toilet? No Using the toilet:No Moving around from place to place: No In the past year have you fallen or had a near fall?:No   Are you sexually active?  No  Do you have more than one partner?  No  Hearing Difficulties: No--- with hearing aids Do you often ask people to speak up or repeat themselves? No Do you experience ringing or noises in your ears? No Do you have difficulty understanding soft or whispered voices? No   Do you feel that you have a problem with memory? No  Do you often misplace items? No  Do you feel safe at home?  No  Cognitive Testing  Alert? Yes  Normal Appearance?Yes  Oriented to person? Yes  Place? Yes   Time? Yes  Recall of three objects?  Yes  Can perform simple calculations? Yes  Displays appropriate judgment?Yes  Can read the correct time from a watch face?Yes   Advanced Directives have been discussed with the patient? Yes  List the Names of Other Physician/Practitioners you currently use: 1.  opth--McCuen 2  Dentist-- Evelene Croon 3.  ent-- shoemaker 4.  Urology-- davis  Indicate any recent Medical Services you may have received from other than [facility] providers in the past year (date may be approximate).  Immunization History  Administered Date(s) Administered  . DTaP 06/05/2013  . Influenza Whole 09/12/2008  . Influenza-Unspecified 11/30/2012, 09/16/2013  .  Pneumococcal Polysaccharide-23 03/02/2008  . Td 11/29/2002  . Tdap 06/05/2013  . Zoster 03/02/2008    Screening Tests Health Maintenance  Topic Date Due  . Influenza Vaccine  06/16/2014  . Mammogram  02/13/2016  . Colonoscopy  06/01/2017  . Tetanus/tdap  06/06/2023  . Pneumococcal Polysaccharide Vaccine Age 15 And Over  Completed  . Zostavax  Completed    All answers were reviewed with the patient and necessary referrals were made:  Garnet Koyanagi, DO   04/03/2014   History reviewed:  She  has a past medical history of Hypertension;  Arthritis; MRSA (methicillin resistant staph aureus) culture positive; Rapid heart beat; Osteopenia; Transitional cell carcinoma; and Melanoma. She  does not have any pertinent problems on file. She  has past surgical history that includes Tonsillectomy; Abdominal hysterectomy (1987); Melanoma excision (1977); Melanoma excision (06-24-09); Cataract extraction (2004); Skin cancer excision (1999); Eye surgery; and teeth implants. Her family history includes Alzheimer's disease in her mother; Dementia in her mother; Diabetes in her mother; Hypertension in her father; Stroke in her father. She  reports that she quit smoking about 33 years ago. She has never used smokeless tobacco. She reports that she drinks alcohol. She reports that she does not use illicit drugs. She has a current medication list which includes the following prescription(s): acetaminophen, amlodipine, estropipate, fluticasone, medroxyprogesterone, meloxicam, similasan nasal spray, naftifine, propranolol, ranitidine, spironolactone, synthroid, and lipo-flavonoid plus. Current Outpatient Prescriptions on File Prior to Visit  Medication Sig Dispense Refill  . acetaminophen (TYLENOL ARTHRITIS PAIN) 650 MG CR tablet Take 650 mg by mouth at bedtime.        . fluticasone (FLONASE) 50 MCG/ACT nasal spray Place 2 sprays into both nostrils daily.  16 g  6  . Misc Natural Product Nasal (SIMILASAN NASAL  SPRAY) SOLN Place 1 spray into both nostrils at bedtime.        . naftifine (NAFTIN) 1 % cream Apply topically daily.  30 g  0  . ranitidine (ZANTAC 75) 75 MG tablet Take 75 mg by mouth daily.        . Vitamins-Lipotropics (LIPO-FLAVONOID PLUS) TABS Take by mouth.       No current facility-administered medications on file prior to visit.   She is allergic to codeine.  Review of Systems  Review of Systems  Constitutional: Negative for activity change, appetite change and fatigue.  HENT: Negative for hearing loss, congestion, tinnitus and ear discharge.   Eyes: Negative for visual disturbance (see optho q1y -- vision corrected to 20/20 with glasses).  Respiratory: Negative for cough, chest tightness and shortness of breath.   Cardiovascular: Negative for chest pain, palpitations and leg swelling.  Gastrointestinal: Negative for abdominal pain, diarrhea, constipation and abdominal distention.  Genitourinary: Negative for urgency, frequency, decreased urine volume and difficulty urinating.  Musculoskeletal: Negative for back pain, arthralgias and gait problem.  Skin: Negative for color change, pallor and rash.  Neurological: Negative for dizziness, light-headedness, numbness and headaches.  Hematological: Negative for adenopathy. Does not bruise/bleed easily.  Psychiatric/Behavioral: Negative for suicidal ideas, confusion, sleep disturbance, self-injury, dysphoric mood, decreased concentration and agitation.  Pt is able to read and write and can do all ADLs No risk for falling No abuse/ violence in home      Objective:     Vision by Snellen chart: opth  Body mass index is 32.25 kg/(m^2). BP 126/78  Pulse 64  Temp(Src) 98 F (36.7 C) (Oral)  Ht 5\' 3"  (1.6 m)  Wt 182 lb (82.555 kg)  BMI 32.25 kg/m2  SpO2 95%  BP 126/78  Pulse 64  Temp(Src) 98 F (36.7 C) (Oral)  Ht 5\' 3"  (1.6 m)  Wt 182 lb (82.555 kg)  BMI 32.25 kg/m2  SpO2 95% General appearance: alert, cooperative,  appears stated age and no distress Head: Normocephalic, without obvious abnormality, atraumatic Eyes: conjunctivae/corneas clear. PERRL, EOM's intact. Fundi benign. Ears: normal TM's and external ear canals both ears Nose: Nares normal. Septum midline. Mucosa normal. No drainage or sinus tenderness. Throat: lips, mucosa, and tongue normal; teeth and gums normal Neck: no adenopathy, no  carotid bruit, no JVD, supple, symmetrical, trachea midline and thyroid not enlarged, symmetric, no tenderness/mass/nodules Back: symmetric, no curvature. ROM normal. No CVA tenderness. Lungs: clear to auscultation bilaterally Breasts: normal appearance, no masses or tenderness Heart: regular rate and rhythm, S1, S2 normal, no murmur, click, rub or gallop Abdomen: soft, non-tender; bowel sounds normal; no masses,  no organomegaly Pelvic: not indicated; status post hysterectomy, negative ROS Extremities: extremities normal, atraumatic, no cyanosis or edema Pulses: 2+ and symmetric Skin: Skin color, texture, turgor normal. No rashes or lesions Lymph nodes: Cervical, supraclavicular, and axillary nodes normal. Neurologic: Alert and oriented X 3, normal strength and tone. Normal symmetric reflexes. Normal coordination and gait Psych-- no depression, no anxiety      Assessment:     cpe      Plan:     During the course of the visit the patient was educated and counseled about appropriate screening and preventive services including:    Pneumococcal vaccine   Screening mammography  Bone densitometry screening  Diabetes screening  Glaucoma screening  Advanced directives: has an advanced directive - a copy HAS NOT been provided.  Diet review for nutrition referral? Yes ____  Not Indicated __x__   Patient Instructions (the written plan) was given to the patient.  Medicare Attestation I have personally reviewed: The patient's medical and social history Their use of alcohol, tobacco or illicit  drugs Their current medications and supplements The patient's functional ability including ADLs,fall risks, home safety risks, cognitive, and hearing and visual impairment Diet and physical activities Evidence for depression or mood disorders  The patient's weight, height, BMI, and visual acuity have been recorded in the chart.  I have made referrals, counseling, and provided education to the patient based on review of the above and I have provided the patient with a written personalized care plan for preventive services.    1. Arthritis  - meloxicam (MOBIC) 15 MG tablet; 1/2-1 by mouth once daily  Dispense: 90 tablet; Refill: 3  2. HTN (hypertension) stable - amLODipine (NORVASC) 5 MG tablet; Take 1 tablet (5 mg total) by mouth daily.  Dispense: 90 tablet; Refill: 3 - spironolactone (ALDACTONE) 25 MG tablet; 1/2 tab po qd  Dispense: 135 tablet; Refill: 3 - propranolol (INDERAL) 10 MG tablet; Take 1 tablet (10 mg total) by mouth 2 (two) times daily.  Dispense: 180 tablet; Refill: 3 - Basic metabolic panel - CBC with Differential - Hepatic function panel - Lipid panel  3. Hypothyroid Check labs - SYNTHROID 75 MCG tablet; Take 1 tablet (75 mcg total) by mouth daily. Brand name necessary  Dispense: 90 tablet; Refill: 3 - Hepatic function panel - Lipid panel - POCT urinalysis dipstick - TSH  4. Postmenopausal  - estropipate (OGEN) 0.75 MG tablet; Take 1 tablet (0.75 mg total) by mouth daily.  Dispense: 90 tablet; Refill: 3  5. Medicare annual wellness visit, subsequent \  Garnet Koyanagi, DO   04/03/2014

## 2014-04-03 NOTE — Addendum Note (Signed)
Addended by: Harl Bowie on: 04/03/2014 12:02 PM   Modules accepted: Orders

## 2014-04-03 NOTE — Patient Instructions (Signed)

## 2014-04-05 LAB — URINE CULTURE
Colony Count: NO GROWTH
Organism ID, Bacteria: NO GROWTH

## 2014-04-10 DIAGNOSIS — J0100 Acute maxillary sinusitis, unspecified: Secondary | ICD-10-CM | POA: Diagnosis not present

## 2014-04-19 DIAGNOSIS — L819 Disorder of pigmentation, unspecified: Secondary | ICD-10-CM | POA: Diagnosis not present

## 2014-04-19 DIAGNOSIS — Z85820 Personal history of malignant melanoma of skin: Secondary | ICD-10-CM | POA: Diagnosis not present

## 2014-04-19 DIAGNOSIS — D2360 Other benign neoplasm of skin of unspecified upper limb, including shoulder: Secondary | ICD-10-CM | POA: Diagnosis not present

## 2014-04-19 DIAGNOSIS — D1801 Hemangioma of skin and subcutaneous tissue: Secondary | ICD-10-CM | POA: Diagnosis not present

## 2014-04-19 DIAGNOSIS — D239 Other benign neoplasm of skin, unspecified: Secondary | ICD-10-CM | POA: Diagnosis not present

## 2014-04-19 DIAGNOSIS — L821 Other seborrheic keratosis: Secondary | ICD-10-CM | POA: Diagnosis not present

## 2014-04-19 DIAGNOSIS — D230 Other benign neoplasm of skin of lip: Secondary | ICD-10-CM | POA: Diagnosis not present

## 2014-05-01 DIAGNOSIS — H905 Unspecified sensorineural hearing loss: Secondary | ICD-10-CM | POA: Diagnosis not present

## 2014-05-29 DIAGNOSIS — H9319 Tinnitus, unspecified ear: Secondary | ICD-10-CM | POA: Diagnosis not present

## 2014-07-05 DIAGNOSIS — Z8551 Personal history of malignant neoplasm of bladder: Secondary | ICD-10-CM | POA: Diagnosis not present

## 2014-07-11 DIAGNOSIS — Z0279 Encounter for issue of other medical certificate: Secondary | ICD-10-CM

## 2014-07-19 DIAGNOSIS — Z85820 Personal history of malignant melanoma of skin: Secondary | ICD-10-CM | POA: Diagnosis not present

## 2014-07-19 DIAGNOSIS — L819 Disorder of pigmentation, unspecified: Secondary | ICD-10-CM | POA: Diagnosis not present

## 2014-07-26 ENCOUNTER — Encounter: Payer: Self-pay | Admitting: Family Medicine

## 2014-07-27 NOTE — Telephone Encounter (Signed)
i would have done it within a few days of her being there

## 2014-08-16 ENCOUNTER — Telehealth: Payer: Self-pay | Admitting: *Deleted

## 2014-08-16 NOTE — Telephone Encounter (Signed)
Medical History Form-Retirement Clinical cytogeneticist was completed and faxed to [employer] on (07/20/2014).  Informed the pt that the form and application was faxed, and I will be mailing her the forms.  Pt stated that she spoke with someone at [facility] and they informed her that they received the form and application.  Pt stated that we can just put the forms in her chart she don't need a copy.  Forms sent to be scanned in the pt's chart.//AB/CMA

## 2014-08-29 DIAGNOSIS — Z23 Encounter for immunization: Secondary | ICD-10-CM | POA: Diagnosis not present

## 2014-08-30 ENCOUNTER — Encounter: Payer: Self-pay | Admitting: Family Medicine

## 2014-08-31 ENCOUNTER — Encounter: Payer: Self-pay | Admitting: Family Medicine

## 2014-08-31 ENCOUNTER — Other Ambulatory Visit: Payer: Self-pay

## 2014-10-05 ENCOUNTER — Other Ambulatory Visit: Payer: Self-pay | Admitting: Dermatology

## 2014-10-05 DIAGNOSIS — L814 Other melanin hyperpigmentation: Secondary | ICD-10-CM | POA: Diagnosis not present

## 2014-10-05 DIAGNOSIS — D224 Melanocytic nevi of scalp and neck: Secondary | ICD-10-CM | POA: Diagnosis not present

## 2014-10-05 DIAGNOSIS — D2239 Melanocytic nevi of other parts of face: Secondary | ICD-10-CM | POA: Diagnosis not present

## 2014-10-05 DIAGNOSIS — D485 Neoplasm of uncertain behavior of skin: Secondary | ICD-10-CM | POA: Diagnosis not present

## 2014-10-05 DIAGNOSIS — D1801 Hemangioma of skin and subcutaneous tissue: Secondary | ICD-10-CM | POA: Diagnosis not present

## 2014-10-05 DIAGNOSIS — Z85820 Personal history of malignant melanoma of skin: Secondary | ICD-10-CM | POA: Diagnosis not present

## 2014-10-05 DIAGNOSIS — B351 Tinea unguium: Secondary | ICD-10-CM | POA: Diagnosis not present

## 2014-10-05 DIAGNOSIS — L821 Other seborrheic keratosis: Secondary | ICD-10-CM | POA: Diagnosis not present

## 2014-10-24 ENCOUNTER — Encounter: Payer: Self-pay | Admitting: Medical

## 2014-10-24 ENCOUNTER — Ambulatory Visit (INDEPENDENT_AMBULATORY_CARE_PROVIDER_SITE_OTHER): Payer: Medicare Other | Admitting: Medical

## 2014-10-24 VITALS — BP 151/89 | HR 70 | Temp 98.1°F | Ht 63.0 in | Wt 189.6 lb

## 2014-10-24 DIAGNOSIS — J0100 Acute maxillary sinusitis, unspecified: Secondary | ICD-10-CM

## 2014-10-24 DIAGNOSIS — J019 Acute sinusitis, unspecified: Secondary | ICD-10-CM | POA: Insufficient documentation

## 2014-10-24 MED ORDER — HYDROCODONE-HOMATROPINE 5-1.5 MG/5ML PO SYRP: ORAL_SOLUTION | ORAL | Status: DC

## 2014-10-24 MED ORDER — CEFDINIR 300 MG PO CAPS: 300.0000 mg | ORAL_CAPSULE | Freq: Two times a day (BID) | ORAL | Status: DC

## 2014-10-24 NOTE — Assessment & Plan Note (Signed)
Your appear to have a sinus infection with lt otitis media. I am prescribing cefdinir  antibiotic for the infection. To help with the nasal congestion use your otc nasal spray but no afrin. For your associated cough, I prescribed cough medicine hydromet.  Rest, hydrate, tylenol for fever.  Follow up in 7 days or as needed.

## 2014-10-24 NOTE — Progress Notes (Signed)
   Subjective:    Patient ID: Wendy Velazquez, female    DOB: [birth year], 73 y.o.   MRN: [id number]  HPI  Pt in today reporting cough, nasal congestion and runny nose for   4 Days. Then yesterday got moderate sore throat and scratchy. Pain on swallowing. Some cough that is mild productive.  Pt feels like about to get chest congestion. Pt does not smoke. Pt quit in 1982. Pt smoked about 10-15 yrs before. About half a pack a day.  Associated symptoms( below yes or no)  Fever-no Chills-yes some. Chest congestion-no but feels like about to. Sneezing- yes. Some on Sunday. Itching eyes-yes mild/some. Sore throat- moderate. Post-nasal drainage-yes. Wheezing-no Purulent  Nasal drainage-today colored mucous. Fatigue-mid Some left ear pain since onset of illness.    Review of Systems  Constitutional: Positive for chills and fatigue. Negative for fever.  HENT: Positive for congestion, ear pain, postnasal drip, rhinorrhea, sinus pressure, sneezing and sore throat.        On left side.  Respiratory: Positive for cough. Negative for chest tightness and wheezing.   Cardiovascular: Negative for chest pain and palpitations.  Musculoskeletal: Negative for neck pain.  Neurological: Negative for dizziness and headaches.  Hematological: Negative for adenopathy. Does not bruise/bleed easily.       Objective:   Physical Exam   General  Mental Status - Alert. General Appearance - Well groomed. Not in acute distress.  Skin Rashes- No Rashes.  HEENT Head- Normal. Ear Auditory Canal - Left- Normal. Right - Normal.Tympanic Membrane- Left- red central portion. Right- Normal. Eye Sclera/Conjunctiva- Left- Normal. Right- Normal. Nose & Sinuses Nasal Mucosa- Left-  Boggy and Congested. Right-  Boggy and  Congested.Lt  Maxillary sinus tendeer  But no  frontal sinus pressure. Mouth & Throat Lips: Upper Lip- Normal: no dryness, cracking, pallor, cyanosis, or vesicular eruption. Lower Lip-Normal:  no dryness, cracking, pallor, cyanosis or vesicular eruption. Buccal Mucosa- Bilateral- No Aphthous ulcers. Oropharynx- No Discharge or Erythema. Tonsils: Characteristics- Bilateral- No Erythema or Congestion. Size/Enlargement- Bilateral- No enlargement. Discharge- bilateral-None.  Neck Neck- Supple. No Masses.   Chest and Lung Exam Auscultation: Breath Sounds:-Clear even and unlabored.  Cardiovascular Auscultation:Rythm- Regular, rate and rhythm. Murmurs & Other Heart Sounds:Ausculatation of the heart reveal- No Murmurs.  Lymphatic Head & Neck General Head & Neck Lymphatics: Bilateral: Description- No Localized lymphadenopathy.         Assessment & Plan:

## 2014-10-24 NOTE — Patient Instructions (Signed)
Your appear to have a sinus infection with lt otitis media. I am prescribing cefdinir  antibiotic for the infection. To help with the nasal congestion use your otc nasal spray but no afrin. For your associated cough, I prescribed cough medicine hydromet.  Rest, hydrate, tylenol for fever.  Follow up in 7 days or as needed.

## 2014-10-24 NOTE — Progress Notes (Signed)
Pre visit review using our clinic review tool, if applicable. No additional management support is needed unless otherwise documented below in the visit note. 

## 2014-11-19 DIAGNOSIS — Z961 Presence of intraocular lens: Secondary | ICD-10-CM | POA: Diagnosis not present

## 2014-12-22 ENCOUNTER — Encounter: Payer: Self-pay | Admitting: Family Medicine

## 2014-12-22 ENCOUNTER — Ambulatory Visit (INDEPENDENT_AMBULATORY_CARE_PROVIDER_SITE_OTHER): Payer: Medicare Other | Admitting: Family Medicine

## 2014-12-22 VITALS — BP 118/82 | Temp 97.9°F | Wt 185.0 lb

## 2014-12-22 DIAGNOSIS — H9202 Otalgia, left ear: Secondary | ICD-10-CM | POA: Diagnosis not present

## 2014-12-22 DIAGNOSIS — J019 Acute sinusitis, unspecified: Secondary | ICD-10-CM

## 2014-12-22 MED ORDER — CEFDINIR 300 MG PO CAPS: 300.0000 mg | ORAL_CAPSULE | Freq: Two times a day (BID) | ORAL | Status: DC

## 2014-12-22 NOTE — Patient Instructions (Signed)

## 2014-12-22 NOTE — Progress Notes (Signed)
Pre visit review using our clinic review tool, if applicable. No additional management support is needed unless otherwise documented below in the visit note. 

## 2014-12-22 NOTE — Progress Notes (Signed)
   Subjective:    Patient ID: Wendy Velazquez, female    DOB: 1941-06-07, 74 y.o.   MRN: [id number]  HPI Patient seen Saturday clinic with onset of left earache this past Monday. She states she's had approximately 2 full weeks of some sinus congestion especially in the left facial/maxillary region. No cough. She tried Mucinex and Tylenol without relief. She has not had any fevers or chills. Increased malaise. Reportedly had otitis media back in December treated with [facility]. Previous GI intolerance with Augmentin. Denies any right ear pain.  Past Medical History  Diagnosis Date  . Hypertension   . Arthritis   . MRSA (methicillin resistant staph aureus) culture positive   . Rapid heart beat     [facility]  . Osteopenia   . Transitional cell carcinoma   . Melanoma    Past Surgical History  Procedure Laterality Date  . Tonsillectomy    . Abdominal hysterectomy  1987    partial  . Melanoma excision  1977    left leg  . Melanoma excision  06-24-09    r foot  . Cataract extraction  2004  . Skin cancer excision  1999    bladder transitional cell  . Eye surgery      cataract b/l  . Teeth implants      reports that she quit smoking about 33 years ago. She has never used smokeless tobacco. She reports that she drinks alcohol. She reports that she does not use illicit drugs. family history includes Alzheimer's disease in her mother; Dementia in her mother; Diabetes in her mother; Hypertension in her father; Stroke in her father. Allergies  Allergen Reactions  . Codeine       Review of Systems  Constitutional: Positive for fatigue. Negative for fever and chills.  HENT: Positive for congestion, ear pain and sinus pressure.   Respiratory: Negative for cough.        Objective:   Physical Exam  Constitutional: She appears well-developed and well-nourished.  HENT:  Right Ear: External ear normal.  Left Ear: External ear normal.  Mouth/Throat: Oropharynx is clear and moist.  Neck:  Neck supple.  Cardiovascular: Normal rate and regular rhythm.   Pulmonary/Chest: Effort normal and breath sounds normal. No respiratory distress. She has no wheezes. She has no rales.  Lymphadenopathy:    She has no cervical adenopathy.          Assessment & Plan:  Acute sinusitis and left otalgia. Basically normal exam. We explained that most sinus infections are viral. She is concerned because of duration. Prescription for Omnicef 300 mg 1 twice daily for 10 days if she has any purulent secretions, fever, or worsening or persistent symptoms.

## 2015-02-22 ENCOUNTER — Encounter: Payer: Self-pay | Admitting: Internal Medicine

## 2015-02-22 ENCOUNTER — Ambulatory Visit (INDEPENDENT_AMBULATORY_CARE_PROVIDER_SITE_OTHER): Payer: Medicare Other | Admitting: Internal Medicine

## 2015-02-22 ENCOUNTER — Encounter: Payer: Self-pay | Admitting: Family Medicine

## 2015-02-22 VITALS — BP 130/82 | HR 62 | Temp 98.1°F | Resp 16 | Ht 63.0 in | Wt 184.8 lb

## 2015-02-22 DIAGNOSIS — J3089 Other allergic rhinitis: Secondary | ICD-10-CM

## 2015-02-22 DIAGNOSIS — J209 Acute bronchitis, unspecified: Secondary | ICD-10-CM

## 2015-02-22 MED ORDER — AZITHROMYCIN 250 MG PO TABS: ORAL_TABLET | ORAL | Status: DC

## 2015-02-22 MED ORDER — HYDROCODONE-HOMATROPINE 5-1.5 MG/5ML PO SYRP: ORAL_SOLUTION | ORAL | Status: DC

## 2015-02-22 NOTE — Progress Notes (Signed)
Pre visit review using our clinic review tool, if applicable. No additional management support is needed unless otherwise documented below in the visit note. 

## 2015-02-22 NOTE — Progress Notes (Signed)
   Subjective:    Patient ID: Wendy Velazquez, female    DOB: January 20, 1941, 74 y.o.   MRN: [id number]  HPI  Her symptoms began 4/5 as head congestion with left maxillary discomfort and clear postnasal drainage. As of 4/6 she developed a cough with clear sputum. She had some associated chest tightness. As of 4/7 she had some chills with yellow sputum which has recurred this morning. She has had associated sneezing. She's had persistent sore throat and the ongoing cough as major symptoms.  She's taken Airborne, Mucinex, Tylenol, leftover Hydromet with response only to the Hydromet.   Review of the chart indicates that she has been seen on multiple occasions for presumed sinus infections or otitis. In February she was on a broad-spectrum cephalosporin.  Review of Systems  She denies nasal purulence, frontal sinus area pain, dental pain, wheezing, shortness of breath.     Objective:   Physical Exam  General appearance:Adequately nourished; no acute distress or increased work of breathing is present.   BMI:32.74  Lymphatic: No  lymphadenopathy about the head, neck, or axilla .  Eyes: No conjunctival inflammation or lid edema is present. There is no scleral icterus.  Ears:  External ear exam shows no significant lesions or deformities. Hearing aids bilaterally. Otoscopic examination reveals clear canals, tympanic membranes are intact bilaterally without bulging, retraction, inflammation or discharge.  Nose:  External nasal examination shows no deformity or inflammation. Moderately severe erythema of nasal mucosa & septum without associated exudate. No septal dislocation or deviation.No obstruction to airflow.   Oral exam: Dental hygiene is good; lips and gums are healthy appearing.There is no oropharyngeal erythema or exudate .  Neck:  No deformities, thyromegaly, masses, or tenderness noted.   Supple with full range of motion without pain.   Heart:  Normal rate and regular rhythm. S1 and  S2 normal without gallop, murmur, click, rub or other extra sounds.   Lungs: Minor rales at the right base initially which cleared with inspiration. No increased work of breathing.  Extremities:  No cyanosis, edema, or clubbing  noted    Skin: Warm & dry w/o tenting or jaundice. No significant lesions or rash.       Assessment & Plan:  #1 acute bronchitis versus cough with production of sequestered secretions from #2   #2 allergic rhinitis  Plan: See orders and recommendations. The pathophysiology of rhinitis with increased risk potentially of rhinosinusitis was discussed. The import of nasal hygiene and preventing this progression was outlined. Clinically she seemed to have no interest in pursuing this.Her focus was on antibiotic therapy.  Potential risk of antibiotic-induced complications such as C. difficile colitis was discussed.

## 2015-02-22 NOTE — Patient Instructions (Signed)
To prevent progression to sinusitis the following is recommended : Plain Mucinex (NOT D) for thick secretions ;force NON dairy fluids .   Nasal cleansing in the shower as discussed with lather of mild shampoo.After 10 seconds wash off lather while  exhaling through nostrils. Make sure that all residual soap is removed to prevent irritation.  Flonase OR Nasacort AQ 1 spray in each nostril twice a day as needed. Use the "crossover" technique into opposite nostril spraying toward opposite ear @ 45 degree angle, not straight up into nostril.  Plain Allegra (NOT D )  160 daily , Loratidine 10 mg , OR Zyrtec 10 mg @ bedtime  as needed for itchy eyes & sneezing.

## 2015-03-28 ENCOUNTER — Telehealth: Payer: Self-pay | Admitting: Family Medicine

## 2015-03-28 NOTE — Telephone Encounter (Signed)
Pre Visit letter sent  °

## 2015-04-17 ENCOUNTER — Encounter: Payer: Self-pay | Admitting: *Deleted

## 2015-04-17 ENCOUNTER — Telehealth: Payer: Self-pay | Admitting: *Deleted

## 2015-04-17 NOTE — Telephone Encounter (Signed)
Pre-Visit Call completed with patient and chart updated.   Pre-Visit Info documented in Specialty Comments under [employer].    

## 2015-04-18 ENCOUNTER — Encounter: Payer: Self-pay | Admitting: Family Medicine

## 2015-04-18 ENCOUNTER — Ambulatory Visit (INDEPENDENT_AMBULATORY_CARE_PROVIDER_SITE_OTHER): Payer: Medicare Other | Admitting: Family Medicine

## 2015-04-18 VITALS — BP 132/80 | HR 56 | Temp 97.9°F | Ht 63.0 in | Wt 182.0 lb

## 2015-04-18 DIAGNOSIS — Z0000 Encounter for general adult medical examination without abnormal findings: Secondary | ICD-10-CM

## 2015-04-18 DIAGNOSIS — M1990 Unspecified osteoarthritis, unspecified site: Secondary | ICD-10-CM | POA: Diagnosis not present

## 2015-04-18 DIAGNOSIS — I10 Essential (primary) hypertension: Secondary | ICD-10-CM

## 2015-04-18 DIAGNOSIS — Z780 Asymptomatic menopausal state: Secondary | ICD-10-CM

## 2015-04-18 DIAGNOSIS — E039 Hypothyroidism, unspecified: Secondary | ICD-10-CM

## 2015-04-18 DIAGNOSIS — Z23 Encounter for immunization: Secondary | ICD-10-CM

## 2015-04-18 LAB — CBC WITH DIFFERENTIAL/PLATELET
BASOS ABS: 0 10*3/uL (ref 0.0–0.1)
Basophils Relative: 0.3 % (ref 0.0–3.0)
Eosinophils Absolute: 0.2 10*3/uL (ref 0.0–0.7)
Eosinophils Relative: 2.6 % (ref 0.0–5.0)
HEMATOCRIT: 43.8 % (ref 36.0–46.0)
HEMOGLOBIN: 15 g/dL (ref 12.0–15.0)
Lymphocytes Relative: 25.9 % (ref 12.0–46.0)
Lymphs Abs: 1.9 10*3/uL (ref 0.7–4.0)
MCHC: 34.3 g/dL (ref 30.0–36.0)
MCV: 89 fl (ref 78.0–100.0)
MONO ABS: 0.4 10*3/uL (ref 0.1–1.0)
Monocytes Relative: 6.2 % (ref 3.0–12.0)
NEUTROS ABS: 4.7 10*3/uL (ref 1.4–7.7)
NEUTROS PCT: 65 % (ref 43.0–77.0)
PLATELETS: 218 10*3/uL (ref 150.0–400.0)
RBC: 4.92 Mil/uL (ref 3.87–5.11)
RDW: 13.5 % (ref 11.5–15.5)
WBC: 7.2 10*3/uL (ref 4.0–10.5)

## 2015-04-18 LAB — LIPID PANEL
CHOLESTEROL: 183 mg/dL (ref 0–200)
HDL: 42.5 mg/dL (ref >39.00)
NONHDL: 140.5
TRIGLYCERIDES: 203 mg/dL — AB (ref 0.0–149.0)
Total CHOL/HDL Ratio: 4
VLDL: 40.6 mg/dL — ABNORMAL HIGH (ref 0.0–40.0)

## 2015-04-18 LAB — HEPATIC FUNCTION PANEL
ALK PHOS: 85 U/L (ref 39–117)
ALT: 17 U/L (ref 0–35)
AST: 18 U/L (ref 0–37)
Albumin: 4.5 g/dL (ref 3.5–5.2)
BILIRUBIN TOTAL: 1 mg/dL (ref 0.2–1.2)
Bilirubin, Direct: 0.1 mg/dL (ref 0.0–0.3)
Total Protein: 6.9 g/dL (ref 6.0–8.3)

## 2015-04-18 LAB — BASIC METABOLIC PANEL
BUN: 22 mg/dL (ref 6–23)
CALCIUM: 9.7 mg/dL (ref 8.4–10.5)
CO2: 30 mEq/L (ref 19–32)
Chloride: 103 mEq/L (ref 96–112)
Creatinine, Ser: 1.1 mg/dL (ref 0.40–1.20)
GFR: 51.77 mL/min — AB (ref >60.00)
Glucose, Bld: 111 mg/dL — ABNORMAL HIGH (ref 70–99)
POTASSIUM: 4.1 meq/L (ref 3.5–5.1)
Sodium: 139 mEq/L (ref 135–145)

## 2015-04-18 LAB — TSH: TSH: 1.42 u[IU]/mL (ref 0.35–4.50)

## 2015-04-18 LAB — MICROALBUMIN / CREATININE URINE RATIO
Creatinine,U: 246.1 mg/dL
MICROALB UR: 2 mg/dL — AB (ref 0.0–1.9)
MICROALB/CREAT RATIO: 0.8 mg/g (ref 0.0–30.0)

## 2015-04-18 LAB — LDL CHOLESTEROL, DIRECT: Direct LDL: 102 mg/dL

## 2015-04-18 MED ORDER — SPIRONOLACTONE 25 MG PO TABS: ORAL_TABLET | ORAL | Status: DC

## 2015-04-18 MED ORDER — MEDROXYPROGESTERONE ACETATE 5 MG PO TABS: ORAL_TABLET | ORAL | Status: DC

## 2015-04-18 MED ORDER — SYNTHROID 75 MCG PO TABS: 75.0000 ug | ORAL_TABLET | Freq: Every day | ORAL | Status: DC

## 2015-04-18 MED ORDER — PNEUMOCOCCAL 13-VAL CONJ VACC IM SUSP: 0.5000 mL | INTRAMUSCULAR | Status: DC

## 2015-04-18 MED ORDER — PROPRANOLOL HCL 10 MG PO TABS: 10.0000 mg | ORAL_TABLET | Freq: Two times a day (BID) | ORAL | Status: DC

## 2015-04-18 MED ORDER — AMLODIPINE BESYLATE 5 MG PO TABS: 5.0000 mg | ORAL_TABLET | Freq: Every day | ORAL | Status: DC

## 2015-04-18 MED ORDER — ESTROPIPATE 0.75 MG PO TABS: 0.7500 mg | ORAL_TABLET | Freq: Every day | ORAL | Status: DC

## 2015-04-18 NOTE — Patient Instructions (Signed)
Preventive Care for Adults A healthy lifestyle and preventive care can promote health and wellness. Preventive health guidelines for women include the following key practices.  A routine yearly physical is a good way to check with your health care provider about your health and preventive screening. It is a chance to share any concerns and updates on your health and to receive a thorough exam.  Visit your dentist for a routine exam and preventive care every 6 months. Brush your teeth twice a day and floss once a day. Good oral hygiene prevents tooth decay and gum disease.  The frequency of eye exams is based on your age, health, family medical history, use of contact lenses, and other factors. Follow your health care provider's recommendations for frequency of eye exams.  Eat a healthy diet. Foods like vegetables, fruits, whole grains, low-fat dairy products, and lean protein foods contain the nutrients you need without too many calories. Decrease your intake of foods high in solid fats, added sugars, and salt. Eat the right amount of calories for you.Get information about a proper diet from your health care provider, if necessary.  Regular physical exercise is one of the most important things you can do for your health. Most adults should get at least 150 minutes of moderate-intensity exercise (any activity that increases your heart rate and causes you to sweat) each week. In addition, most adults need muscle-strengthening exercises on 2 or more days a week.  Maintain a healthy weight. The body mass index (BMI) is a screening tool to identify possible weight problems. It provides an estimate of body fat based on height and weight. Your health care provider can find your BMI and can help you achieve or maintain a healthy weight.For adults 20 years and older:  A BMI below 18.5 is considered underweight.  A BMI of 18.5 to 24.9 is normal.  A BMI of 25 to 29.9 is considered overweight.  A BMI of  30 and above is considered obese.  Maintain normal blood lipids and cholesterol levels by exercising and minimizing your intake of saturated fat. Eat a balanced diet with plenty of fruit and vegetables. Blood tests for lipids and cholesterol should begin at age 76 and be repeated every 5 years. If your lipid or cholesterol levels are high, you are over 50, or you are at high risk for heart disease, you may need your cholesterol levels checked more frequently.Ongoing high lipid and cholesterol levels should be treated with medicines if diet and exercise are not working.  If you smoke, find out from your health care provider how to quit. If you do not use tobacco, do not start.  Lung cancer screening is recommended for adults aged 22-80 years who are at high risk for developing lung cancer because of a history of smoking. A yearly low-dose CT scan of the lungs is recommended for people who have at least a 30-pack-year history of smoking and are a current smoker or have quit within the past 15 years. A pack year of smoking is smoking an average of 1 pack of cigarettes a day for 1 year (for example: 1 pack a day for 30 years or 2 packs a day for 15 years). Yearly screening should continue until the smoker has stopped smoking for at least 15 years. Yearly screening should be stopped for people who develop a health problem that would prevent them from having lung cancer treatment.  If you are pregnant, do not drink alcohol. If you are breastfeeding,  be very cautious about drinking alcohol. If you are not pregnant and choose to drink alcohol, do not have more than 1 drink per day. One drink is considered to be 12 ounces (355 mL) of beer, 5 ounces (148 mL) of wine, or 1.5 ounces (44 mL) of liquor.  Avoid use of street drugs. Do not share needles with anyone. Ask for help if you need support or instructions about stopping the use of drugs.  High blood pressure causes heart disease and increases the risk of  stroke. Your blood pressure should be checked at least every 1 to 2 years. Ongoing high blood pressure should be treated with medicines if weight loss and exercise do not work.  If you are 75-52 years old, ask your health care provider if you should take aspirin to prevent strokes.  Diabetes screening involves taking a blood sample to check your fasting blood sugar level. This should be done once every 3 years, after age 15, if you are within normal weight and without risk factors for diabetes. Testing should be considered at a younger age or be carried out more frequently if you are overweight and have at least 1 risk factor for diabetes.  Breast cancer screening is essential preventive care for women. You should practice "breast self-awareness." This means understanding the normal appearance and feel of your breasts and may include breast self-examination. Any changes detected, no matter how small, should be reported to a health care provider. Women in their 58s and 30s should have a clinical breast exam (CBE) by a health care provider as part of a regular health exam every 1 to 3 years. After age 16, women should have a CBE every year. Starting at age 53, women should consider having a mammogram (breast X-ray test) every year. Women who have a family history of breast cancer should talk to their health care provider about genetic screening. Women at a high risk of breast cancer should talk to their health care providers about having an MRI and a mammogram every year.  Breast cancer gene (BRCA)-related cancer risk assessment is recommended for women who have family members with BRCA-related cancers. BRCA-related cancers include breast, ovarian, tubal, and peritoneal cancers. Having family members with these cancers may be associated with an increased risk for harmful changes (mutations) in the breast cancer genes BRCA1 and BRCA2. Results of the assessment will determine the need for genetic counseling and  BRCA1 and BRCA2 testing.  Routine pelvic exams to screen for cancer are no longer recommended for nonpregnant women who are considered low risk for cancer of the pelvic organs (ovaries, uterus, and vagina) and who do not have symptoms. Ask your health care provider if a screening pelvic exam is right for you.  If you have had past treatment for cervical cancer or a condition that could lead to cancer, you need Pap tests and screening for cancer for at least 20 years after your treatment. If Pap tests have been discontinued, your risk factors (such as having a new sexual partner) need to be reassessed to determine if screening should be resumed. Some women have medical problems that increase the chance of getting cervical cancer. In these cases, your health care provider may recommend more frequent screening and Pap tests.  The HPV test is an additional test that may be used for cervical cancer screening. The HPV test looks for the virus that can cause the cell changes on the cervix. The cells collected during the Pap test can be  tested for HPV. The HPV test could be used to screen women aged 30 years and older, and should be used in women of any age who have unclear Pap test results. After the age of 30, women should have HPV testing at the same frequency as a Pap test.  Colorectal cancer can be detected and often prevented. Most routine colorectal cancer screening begins at the age of 50 years and continues through age 75 years. However, your health care provider may recommend screening at an earlier age if you have risk factors for colon cancer. On a yearly basis, your health care provider may provide home test kits to check for hidden blood in the stool. Use of a small camera at the end of a tube, to directly examine the colon (sigmoidoscopy or colonoscopy), can detect the earliest forms of colorectal cancer. Talk to your health care provider about this at age 50, when routine screening begins. Direct  exam of the colon should be repeated every 5-10 years through age 75 years, unless early forms of pre-cancerous polyps or small growths are found.  People who are at an increased risk for hepatitis B should be screened for this virus. You are considered at high risk for hepatitis B if:  You were born in a country where hepatitis B occurs often. Talk with your health care provider about which countries are considered high risk.  Your parents were born in a high-risk country and you have not received a shot to protect against hepatitis B (hepatitis B vaccine).  You have HIV or AIDS.  You use needles to inject street drugs.  You live with, or have sex with, someone who has hepatitis B.  You get hemodialysis treatment.  You take certain medicines for conditions like cancer, organ transplantation, and autoimmune conditions.  Hepatitis C blood testing is recommended for all people born from 1945 through 1965 and any individual with known risks for hepatitis C.  Practice safe sex. Use condoms and avoid high-risk sexual practices to reduce the spread of sexually transmitted infections (STIs). STIs include gonorrhea, chlamydia, syphilis, trichomonas, herpes, HPV, and human immunodeficiency virus (HIV). Herpes, HIV, and HPV are viral illnesses that have no cure. They can result in disability, cancer, and death.  You should be screened for sexually transmitted illnesses (STIs) including gonorrhea and chlamydia if:  You are sexually active and are younger than 24 years.  You are older than 24 years and your health care provider tells you that you are at risk for this type of infection.  Your sexual activity has changed since you were last screened and you are at an increased risk for chlamydia or gonorrhea. Ask your health care provider if you are at risk.  If you are at risk of being infected with HIV, it is recommended that you take a prescription medicine daily to prevent HIV infection. This is  called preexposure prophylaxis (PrEP). You are considered at risk if:  You are a heterosexual woman, are sexually active, and are at increased risk for HIV infection.  You take drugs by injection.  You are sexually active with a partner who has HIV.  Talk with your health care provider about whether you are at high risk of being infected with HIV. If you choose to begin PrEP, you should first be tested for HIV. You should then be tested every 3 months for as long as you are taking PrEP.  Osteoporosis is a disease in which the bones lose minerals and strength   with aging. This can result in serious bone fractures or breaks. The risk of osteoporosis can be identified using a bone density scan. Women ages 65 years and over and women at risk for fractures or osteoporosis should discuss screening with their health care providers. Ask your health care provider whether you should take a calcium supplement or vitamin D to reduce the rate of osteoporosis.  Menopause can be associated with physical symptoms and risks. Hormone replacement therapy is available to decrease symptoms and risks. You should talk to your health care provider about whether hormone replacement therapy is right for you.  Use sunscreen. Apply sunscreen liberally and repeatedly throughout the day. You should seek shade when your shadow is shorter than you. Protect yourself by wearing long sleeves, pants, a wide-brimmed hat, and sunglasses year round, whenever you are outdoors.  Once a month, do a whole body skin exam, using a mirror to look at the skin on your back. Tell your health care provider of new moles, moles that have irregular borders, moles that are larger than a pencil eraser, or moles that have changed in shape or color.  Stay current with required vaccines (immunizations).  Influenza vaccine. All adults should be immunized every year.  Tetanus, diphtheria, and acellular pertussis (Td, Tdap) vaccine. Pregnant women should  receive 1 dose of Tdap vaccine during each pregnancy. The dose should be obtained regardless of the length of time since the last dose. Immunization is preferred during the 27th-36th week of gestation. An adult who has not previously received Tdap or who does not know her vaccine status should receive 1 dose of Tdap. This initial dose should be followed by tetanus and diphtheria toxoids (Td) booster doses every 10 years. Adults with an unknown or incomplete history of completing a 3-dose immunization series with Td-containing vaccines should begin or complete a primary immunization series including a Tdap dose. Adults should receive a Td booster every 10 years.  Varicella vaccine. An adult without evidence of immunity to varicella should receive 2 doses or a second dose if she has previously received 1 dose. Pregnant females who do not have evidence of immunity should receive the first dose after pregnancy. This first dose should be obtained before leaving the health care facility. The second dose should be obtained 4-8 weeks after the first dose.  Human papillomavirus (HPV) vaccine. Females aged 13-26 years who have not received the vaccine previously should obtain the 3-dose series. The vaccine is not recommended for use in pregnant females. However, pregnancy testing is not needed before receiving a dose. If a female is found to be pregnant after receiving a dose, no treatment is needed. In that case, the remaining doses should be delayed until after the pregnancy. Immunization is recommended for any person with an immunocompromised condition through the age of 26 years if she did not get any or all doses earlier. During the 3-dose series, the second dose should be obtained 4-8 weeks after the first dose. The third dose should be obtained 24 weeks after the first dose and 16 weeks after the second dose.  Zoster vaccine. One dose is recommended for adults aged 60 years or older unless certain conditions are  present.  Measles, mumps, and rubella (MMR) vaccine. Adults born before 1957 generally are considered immune to measles and mumps. Adults born in 1957 or later should have 1 or more doses of MMR vaccine unless there is a contraindication to the vaccine or there is laboratory evidence of immunity to   each of the three diseases. A routine second dose of MMR vaccine should be obtained at least 28 days after the first dose for students attending postsecondary schools, health care workers, or international travelers. People who received inactivated measles vaccine or an unknown type of measles vaccine during 1963-1967 should receive 2 doses of MMR vaccine. People who received inactivated mumps vaccine or an unknown type of mumps vaccine before 1979 and are at high risk for mumps infection should consider immunization with 2 doses of MMR vaccine. For females of childbearing age, rubella immunity should be determined. If there is no evidence of immunity, females who are not pregnant should be vaccinated. If there is no evidence of immunity, females who are pregnant should delay immunization until after pregnancy. Unvaccinated health care workers born before 1957 who lack laboratory evidence of measles, mumps, or rubella immunity or laboratory confirmation of disease should consider measles and mumps immunization with 2 doses of MMR vaccine or rubella immunization with 1 dose of MMR vaccine.  Pneumococcal 13-valent conjugate (PCV13) vaccine. When indicated, a person who is uncertain of her immunization history and has no record of immunization should receive the PCV13 vaccine. An adult aged 19 years or older who has certain medical conditions and has not been previously immunized should receive 1 dose of PCV13 vaccine. This PCV13 should be followed with a dose of pneumococcal polysaccharide (PPSV23) vaccine. The PPSV23 vaccine dose should be obtained at least 8 weeks after the dose of PCV13 vaccine. An adult aged 19  years or older who has certain medical conditions and previously received 1 or more doses of PPSV23 vaccine should receive 1 dose of PCV13. The PCV13 vaccine dose should be obtained 1 or more years after the last PPSV23 vaccine dose.  Pneumococcal polysaccharide (PPSV23) vaccine. When PCV13 is also indicated, PCV13 should be obtained first. All adults aged 65 years and older should be immunized. An adult younger than age 65 years who has certain medical conditions should be immunized. Any person who resides in a nursing home or long-term care facility should be immunized. An adult smoker should be immunized. People with an immunocompromised condition and certain other conditions should receive both PCV13 and PPSV23 vaccines. People with human immunodeficiency virus (HIV) infection should be immunized as soon as possible after diagnosis. Immunization during chemotherapy or radiation therapy should be avoided. Routine use of PPSV23 vaccine is not recommended for American Indians, [place] Natives, or people younger than 65 years unless there are medical conditions that require PPSV23 vaccine. When indicated, people who have unknown immunization and have no record of immunization should receive PPSV23 vaccine. One-time revaccination 5 years after the first dose of PPSV23 is recommended for people aged 19-64 years who have chronic kidney failure, nephrotic syndrome, asplenia, or immunocompromised conditions. People who received 1-2 doses of PPSV23 before age 65 years should receive another dose of PPSV23 vaccine at age 65 years or later if at least 5 years have passed since the previous dose. Doses of PPSV23 are not needed for people immunized with PPSV23 at or after age 65 years.  Meningococcal vaccine. Adults with asplenia or persistent complement component deficiencies should receive 2 doses of quadrivalent meningococcal conjugate (MenACWY-D) vaccine. The doses should be obtained at least 2 months apart.  Microbiologists working with certain meningococcal bacteria, military recruits, people at risk during an outbreak, and people who travel to or live in countries with a high rate of meningitis should be immunized. A first-year college student up through age   21 years who is living in a residence hall should receive a dose if she did not receive a dose on or after her 16th birthday. Adults who have certain high-risk conditions should receive one or more doses of vaccine.  Hepatitis A vaccine. Adults who wish to be protected from this disease, have certain high-risk conditions, work with hepatitis A-infected animals, work in hepatitis A research labs, or travel to or work in countries with a high rate of hepatitis A should be immunized. Adults who were previously unvaccinated and who anticipate close contact with an international adoptee during the first 60 days after arrival in the [place] from a country with a high rate of hepatitis A should be immunized.  Hepatitis B vaccine. Adults who wish to be protected from this disease, have certain high-risk conditions, may be exposed to blood or other infectious body fluids, are household contacts or sex partners of hepatitis B positive people, are clients or workers in certain care facilities, or travel to or work in countries with a high rate of hepatitis B should be immunized.  Haemophilus influenzae type b (Hib) vaccine. A previously unvaccinated person with asplenia or sickle cell disease or having a scheduled splenectomy should receive 1 dose of Hib vaccine. Regardless of previous immunization, a recipient of a hematopoietic stem cell transplant should receive a 3-dose series 6-12 months after her successful transplant. Hib vaccine is not recommended for adults with HIV infection. Preventive Services / Frequency Ages 64 to 68 years  Blood pressure check.** / Every 1 to 2 years.  Lipid and cholesterol check.** / Every 5 years beginning at age  22.  Clinical breast exam.** / Every 3 years for women in their 88s and 53s.  BRCA-related cancer risk assessment.** / For women who have family members with a BRCA-related cancer (breast, ovarian, tubal, or peritoneal cancers).  Pap test.** / Every 2 years from ages 90 through 51. Every 3 years starting at age 21 through age 56 or 3 with a history of 3 consecutive normal Pap tests.  HPV screening.** / Every 3 years from ages 24 through ages 1 to 46 with a history of 3 consecutive normal Pap tests.  Hepatitis C blood test.** / For any individual with known risks for hepatitis C.  Skin self-exam. / Monthly.  Influenza vaccine. / Every year.  Tetanus, diphtheria, and acellular pertussis (Tdap, Td) vaccine.** / Consult your health care provider. Pregnant women should receive 1 dose of Tdap vaccine during each pregnancy. 1 dose of Td every 10 years.  Varicella vaccine.** / Consult your health care provider. Pregnant females who do not have evidence of immunity should receive the first dose after pregnancy.  HPV vaccine. / 3 doses over 6 months, if 72 and younger. The vaccine is not recommended for use in pregnant females. However, pregnancy testing is not needed before receiving a dose.  Measles, mumps, rubella (MMR) vaccine.** / You need at least 1 dose of MMR if you were born in 1957 or later. You may also need a 2nd dose. For females of childbearing age, rubella immunity should be determined. If there is no evidence of immunity, females who are not pregnant should be vaccinated. If there is no evidence of immunity, females who are pregnant should delay immunization until after pregnancy.  Pneumococcal 13-valent conjugate (PCV13) vaccine.** / Consult your health care provider.  Pneumococcal polysaccharide (PPSV23) vaccine.** / 1 to 2 doses if you smoke cigarettes or if you have certain conditions.  Meningococcal vaccine.** /  1 dose if you are age 19 to 21 years and a first-year college  student living in a residence hall, or have one of several medical conditions, you need to get vaccinated against meningococcal disease. You may also need additional booster doses.  Hepatitis A vaccine.** / Consult your health care provider.  Hepatitis B vaccine.** / Consult your health care provider.  Haemophilus influenzae type b (Hib) vaccine.** / Consult your health care provider. Ages 40 to 64 years  Blood pressure check.** / Every 1 to 2 years.  Lipid and cholesterol check.** / Every 5 years beginning at age 20 years.  Lung cancer screening. / Every year if you are aged 55-80 years and have a 30-pack-year history of smoking and currently smoke or have quit within the past 15 years. Yearly screening is stopped once you have quit smoking for at least 15 years or develop a health problem that would prevent you from having lung cancer treatment.  Clinical breast exam.** / Every year after age 40 years.  BRCA-related cancer risk assessment.** / For women who have family members with a BRCA-related cancer (breast, ovarian, tubal, or peritoneal cancers).  Mammogram.** / Every year beginning at age 40 years and continuing for as long as you are in good health. Consult with your health care provider.  Pap test.** / Every 3 years starting at age 30 years through age 65 or 70 years with a history of 3 consecutive normal Pap tests.  HPV screening.** / Every 3 years from ages 30 years through ages 65 to 70 years with a history of 3 consecutive normal Pap tests.  Fecal occult blood test (FOBT) of stool. / Every year beginning at age 50 years and continuing until age 75 years. You may not need to do this test if you get a colonoscopy every 10 years.  Flexible sigmoidoscopy or colonoscopy.** / Every 5 years for a flexible sigmoidoscopy or every 10 years for a colonoscopy beginning at age 50 years and continuing until age 75 years.  Hepatitis C blood test.** / For all people born from 1945 through  1965 and any individual with known risks for hepatitis C.  Skin self-exam. / Monthly.  Influenza vaccine. / Every year.  Tetanus, diphtheria, and acellular pertussis (Tdap/Td) vaccine.** / Consult your health care provider. Pregnant women should receive 1 dose of Tdap vaccine during each pregnancy. 1 dose of Td every 10 years.  Varicella vaccine.** / Consult your health care provider. Pregnant females who do not have evidence of immunity should receive the first dose after pregnancy.  Zoster vaccine.** / 1 dose for adults aged 60 years or older.  Measles, mumps, rubella (MMR) vaccine.** / You need at least 1 dose of MMR if you were born in 1957 or later. You may also need a 2nd dose. For females of childbearing age, rubella immunity should be determined. If there is no evidence of immunity, females who are not pregnant should be vaccinated. If there is no evidence of immunity, females who are pregnant should delay immunization until after pregnancy.  Pneumococcal 13-valent conjugate (PCV13) vaccine.** / Consult your health care provider.  Pneumococcal polysaccharide (PPSV23) vaccine.** / 1 to 2 doses if you smoke cigarettes or if you have certain conditions.  Meningococcal vaccine.** / Consult your health care provider.  Hepatitis A vaccine.** / Consult your health care provider.  Hepatitis B vaccine.** / Consult your health care provider.  Haemophilus influenzae type b (Hib) vaccine.** / Consult your health care provider. Ages 65   years and over  Blood pressure check.** / Every 1 to 2 years.  Lipid and cholesterol check.** / Every 5 years beginning at age 22 years.  Lung cancer screening. / Every year if you are aged 73-80 years and have a 30-pack-year history of smoking and currently smoke or have quit within the past 15 years. Yearly screening is stopped once you have quit smoking for at least 15 years or develop a health problem that would prevent you from having lung cancer  treatment.  Clinical breast exam.** / Every year after age 4 years.  BRCA-related cancer risk assessment.** / For women who have family members with a BRCA-related cancer (breast, ovarian, tubal, or peritoneal cancers).  Mammogram.** / Every year beginning at age 40 years and continuing for as long as you are in good health. Consult with your health care provider.  Pap test.** / Every 3 years starting at age 9 years through age 34 or 91 years with 3 consecutive normal Pap tests. Testing can be stopped between 65 and 70 years with 3 consecutive normal Pap tests and no abnormal Pap or HPV tests in the past 10 years.  HPV screening.** / Every 3 years from ages 57 years through ages 64 or 45 years with a history of 3 consecutive normal Pap tests. Testing can be stopped between 65 and 70 years with 3 consecutive normal Pap tests and no abnormal Pap or HPV tests in the past 10 years.  Fecal occult blood test (FOBT) of stool. / Every year beginning at age 15 years and continuing until age 17 years. You may not need to do this test if you get a colonoscopy every 10 years.  Flexible sigmoidoscopy or colonoscopy.** / Every 5 years for a flexible sigmoidoscopy or every 10 years for a colonoscopy beginning at age 86 years and continuing until age 71 years.  Hepatitis C blood test.** / For all people born from [birth year] through 1964 and any individual with known risks for hepatitis C.  Osteoporosis screening.** / A one-time screening for women ages 83 years and over and women at risk for fractures or osteoporosis.  Skin self-exam. / Monthly.  Influenza vaccine. / Every year.  Tetanus, diphtheria, and acellular pertussis (Tdap/Td) vaccine.** / 1 dose of Td every 10 years.  Varicella vaccine.** / Consult your health care provider.  Zoster vaccine.** / 1 dose for adults aged 61 years or older.  Pneumococcal 13-valent conjugate (PCV13) vaccine.** / Consult your health care provider.  Pneumococcal  polysaccharide (PPSV23) vaccine.** / 1 dose for all adults aged 28 years and older.  Meningococcal vaccine.** / Consult your health care provider.  Hepatitis A vaccine.** / Consult your health care provider.  Hepatitis B vaccine.** / Consult your health care provider.  Haemophilus influenzae type b (Hib) vaccine.** / Consult your health care provider. ** Family history and personal history of risk and conditions may change your health care provider's recommendations. Document Released: 12/29/2001 Document Revised: 03/19/2014 Document Reviewed: 03/30/2011 [facility] Patient Information 2015 [place], [place]. This information is not intended to replace advice given to you by your health care provider. Make sure you discuss any questions you have with your health care provider.

## 2015-04-18 NOTE — Progress Notes (Signed)
Pre visit review using our clinic review tool, if applicable. No additional management support is needed unless otherwise documented below in the visit note. 

## 2015-04-18 NOTE — Progress Notes (Signed)
Subjective:   Wendy Velazquez is a 74 y.o. female who presents for Medicare Annual (Subsequent) preventive examination.  Review of Systems:   Review of Systems  Constitutional: Negative for activity change, appetite change and fatigue.  HENT: Negative for hearing loss, congestion, tinnitus and ear discharge.   Eyes: Negative for visual disturbance (see optho q1y -- vision corrected to 20/20 with glasses).  Respiratory: Negative for cough, chest tightness and shortness of breath.   Cardiovascular: Negative for chest pain, palpitations and leg swelling.  Gastrointestinal: Negative for abdominal pain, diarrhea, constipation and abdominal distention.  Genitourinary: Negative for urgency, frequency, decreased urine volume and difficulty urinating.  Musculoskeletal: Negative for back pain, arthralgias and gait problem.  Skin: Negative for color change, pallor and rash.  Neurological: Negative for dizziness, light-headedness, numbness and headaches.  Hematological: Negative for adenopathy. Does not bruise/bleed easily.  Psychiatric/Behavioral: Negative for suicidal ideas, confusion, sleep disturbance, self-injury, dysphoric mood, decreased concentration and agitation.  Pt is able to read and write and can do all ADLs No risk for falling No abuse/ violence in home           Objective:     Vitals: BP 132/80 mmHg  Pulse 56  Temp(Src) 97.9 F (36.6 C) (Oral)  Ht 5\' 3"  (1.6 m)  Wt 182 lb (82.555 kg)  BMI 32.25 kg/m2  SpO2 97% BP 132/80 mmHg  Pulse 56  Temp(Src) 97.9 F (36.6 C) (Oral)  Ht 5\' 3"  (1.6 m)  Wt 182 lb (82.555 kg)  BMI 32.25 kg/m2  SpO2 97% General appearance: alert, cooperative, appears stated age and no distress Head: Normocephalic, without obvious abnormality, atraumatic Eyes: conjunctivae/corneas clear. PERRL, EOM's intact. Fundi benign. Ears: normal TM's and external ear canals both ears Nose: Nares normal. Septum midline. Mucosa normal. No drainage or  sinus tenderness. Throat: lips, mucosa, and tongue normal; teeth and gums normal Neck: no adenopathy, no carotid bruit, no JVD, supple, symmetrical, trachea midline and thyroid not enlarged, symmetric, no tenderness/mass/nodules Back: symmetric, no curvature. ROM normal. No CVA tenderness. Lungs: clear to auscultation bilaterally Breasts: normal appearance, no masses or tenderness Heart: regular rate and rhythm, S1, S2 normal, no murmur, click, rub or gallop Abdomen: soft, non-tender; bowel sounds normal; no masses,  no organomegaly Pelvic: not indicated; status post hysterectomy, negative ROS Extremities: extremities normal, atraumatic, no cyanosis or edema Pulses: 2+ and symmetric Skin: Skin color, texture, turgor normal. No rashes or lesions Lymph nodes: Cervical, supraclavicular, and axillary nodes normal. Neurologic: Alert and oriented X 3, normal strength and tone. Normal symmetric reflexes. Normal coordination and gait Psych- no depression, no anxiety  Tobacco History  Smoking status  . Former Smoker -- 0.30 packs/day for 15 years  . Quit date: 03/15/1981  Smokeless tobacco  . Never Used     Counseling given: Not Answered   Past Medical History  Diagnosis Date  . Hypertension   . Arthritis   . MRSA (methicillin resistant staph aureus) culture positive   . Rapid heart beat     benigh  . Osteopenia   . Transitional cell carcinoma   . Melanoma    Past Surgical History  Procedure Laterality Date  . Tonsillectomy    . Abdominal hysterectomy  1987    partial  . Melanoma excision  1977    left leg  . Melanoma excision  06-24-09    r foot  . Cataract extraction  2004  . Skin cancer excision  1999    bladder transitional cell  .  Eye surgery      cataract b/l  . Teeth implants     Family History  Problem Relation Age of Onset  . Diabetes Mother   . Alzheimer's disease Mother   . Dementia Mother   . Stroke Father   . Hypertension Father    History  Sexual  Activity  . Sexual Activity:  . Partners: Male    Outpatient Encounter Prescriptions as of 04/18/2015  Medication Sig  . acetaminophen (TYLENOL ARTHRITIS PAIN) 650 MG CR tablet Take 650 mg by mouth at bedtime.    Marland Kitchen amLODipine (NORVASC) 5 MG tablet Take 1 tablet (5 mg total) by mouth daily.  . Digestive Enzymes (ENZYMATIC DIGESTANT PO) Take by mouth. Enzymatic Therapy probiotic pearls  . estropipate (OGEN) 0.75 MG tablet Take 1 tablet (0.75 mg total) by mouth daily.  Donnie Aho (GLUCOSAMINE MSM COMPLEX PO) Take 1 tablet by mouth daily.  . medroxyPROGESTERone (PROVERA) 5 MG tablet 1/2 tab po qd 10 days a month as directed.  Brand name necessary.  . meloxicam (MOBIC) 15 MG tablet 1/2-1 by mouth once daily  . Misc Natural Product Nasal (SIMILASAN NASAL SPRAY) SOLN Place 1 spray into both nostrils at bedtime.    . propranolol (INDERAL) 10 MG tablet Take 1 tablet (10 mg total) by mouth 2 (two) times daily.  . ranitidine (ZANTAC 75) 75 MG tablet Take 75 mg by mouth daily.    Marland Kitchen spironolactone (ALDACTONE) 25 MG tablet 1/2 tab po qd  . SYNTHROID 75 MCG tablet Take 1 tablet (75 mcg total) by mouth daily. Brand name necessary  . [DISCONTINUED] amLODipine (NORVASC) 5 MG tablet Take 1 tablet (5 mg total) by mouth daily.  . [DISCONTINUED] estropipate (OGEN) 0.75 MG tablet Take 1 tablet (0.75 mg total) by mouth daily.  . [DISCONTINUED] medroxyPROGESTERone (PROVERA) 5 MG tablet 1/2 tab po qd 10 days a month as directed.  Brand name necessary.  . [DISCONTINUED] propranolol (INDERAL) 10 MG tablet Take 1 tablet (10 mg total) by mouth 2 (two) times daily.  . [DISCONTINUED] spironolactone (ALDACTONE) 25 MG tablet 1/2 tab po qd  . [DISCONTINUED] SYNTHROID 75 MCG tablet Take 1 tablet (75 mcg total) by mouth daily. Brand name necessary   No facility-administered encounter medications on file as of 04/18/2015.    Activities of Daily Living In your present state of health, do you have any  difficulty performing the following activities: 04/18/2015  Hearing? Y  Vision? N  Difficulty concentrating or making decisions? N  Walking or climbing stairs? N  Dressing or bathing? N  Doing errands, shopping? N    Patient Care Team: Rosalita Chessman, DO as PCP - General    Assessment:    .CPE: Exercise Activities and Dietary recommendations== con'e    Goals    None     Fall Risk Fall Risk  04/18/2015 04/03/2014 03/29/2013  Falls in the past year? No No No   Depression Screen PHQ 2/9 Scores 04/18/2015 04/03/2014 03/29/2013 03/22/2012  PHQ - 2 Score 0 0 0 0  Exception Documentation Patient refusal - - -     Cognitive Testing No depression, no anxiety  Immunization History  Administered Date(s) Administered  . DTaP 06/05/2013  . Influenza Whole 09/12/2008  . Influenza, High Dose Seasonal PF 08/29/2014  . Influenza-Unspecified 11/30/2012, 09/16/2013  . Pneumococcal Polysaccharide-23 03/02/2008  . Td 11/29/2002  . Tdap 06/05/2013  . Zoster 03/02/2008   Screening Tests Health Maintenance  Topic Date Due  . PNA vac Low  Risk Adult (2 of 2 - PCV13) 09/19/2015 (Originally 03/02/2009)  . INFLUENZA VACCINE  06/17/2015  . MAMMOGRAM  02/13/2016  . COLONOSCOPY  06/01/2017  . TETANUS/TDAP  06/06/2023  . DEXA SCAN  Completed  . ZOSTAVAX  Completed      Plan:    cpe During the course of the visit the patient was educated and counseled about the following appropriate screening and preventive services:   Vaccines to include Pneumoccal, Influenza, Hepatitis B, Td, Zostavax, HCV  Electrocardiogram  Cardiovascular Disease  Colorectal cancer screening  Bone density screening  Diabetes screening  Glaucoma screening  Mammography/PAP  Nutrition counseling   Patient Instructions (the written plan) was given to the patient.  1. Essential hypertension Stable---  con't inderal  - Basic metabolic panel - Hepatic function panel - Lipid panel - CBC with  Differential/Platelet - amLODipine (NORVASC) 5 MG tablet; Take 1 tablet (5 mg total) by mouth daily.  Dispense: 90 tablet; Refill: 3 - propranolol (INDERAL) 10 MG tablet; Take 1 tablet (10 mg total) by mouth 2 (two) times daily.  Dispense: 180 tablet; Refill: 3 - spironolactone (ALDACTONE) 25 MG tablet; 1/2 tab po qd  Dispense: 135 tablet; Refill: 3 - Microalbumin / creatinine urine ratio - POCT urinalysis dipstick  2. Postmenopausal  - estropipate (OGEN) 0.75 MG tablet; Take 1 tablet (0.75 mg total) by mouth daily.  Dispense: 90 tablet; Refill: 3  3. Hypothyroidism, unspecified hypothyroidism type  - TSH - SYNTHROID 75 MCG tablet; Take 1 tablet (75 mcg total) by mouth daily. Brand name necessary  Dispense: 90 tablet; Refill: 3  4. Arthritis   5. Menopause  - medroxyPROGESTERone (PROVERA) 5 MG tablet; 1/2 tab po qd 10 days a month as directed.  Brand name necessary.  Dispense: 90 tablet; Refill: 3  6. Need for vaccination for Strep pneumoniae  - pneumococcal 13-valent conjugate vaccine (PREVNAR 13) SUSP injection; Inject 0.5 mLs into the muscle tomorrow at 10 am.  Dispense: 0.5 mL; Refill: 0  7. Routine history and physical examination of adult See above and avs  Garnet Koyanagi, DO  04/18/2015

## 2015-04-22 ENCOUNTER — Telehealth: Payer: Self-pay | Admitting: Family Medicine

## 2015-04-22 ENCOUNTER — Other Ambulatory Visit: Payer: Self-pay | Admitting: Family Medicine

## 2015-04-22 NOTE — Telephone Encounter (Signed)
Relation to pt: self  Call back number: [phone] Pharmacy: [facility] [place], [place]  Reason for call:  Pt requesting a refill meloxicam (MOBIC) 15 MG tablet. Last seen 04/18/2015

## 2015-04-22 NOTE — Telephone Encounter (Signed)
Refill x1  3 refills 

## 2015-04-22 NOTE — Telephone Encounter (Signed)
Last seen 04/18/15 and filled 04/03/14 #90 with 3 Rf.   Please advise     KP

## 2015-04-26 LAB — POCT URINALYSIS DIPSTICK
Bilirubin, UA: NEGATIVE
Glucose, UA: NEGATIVE
LEUKOCYTES UA: NEGATIVE
NITRITE UA: NEGATIVE
RBC UA: NEGATIVE
UROBILINOGEN UA: 4
pH, UA: 5

## 2015-05-02 ENCOUNTER — Encounter: Payer: Self-pay | Admitting: Family Medicine

## 2015-05-17 DIAGNOSIS — L814 Other melanin hyperpigmentation: Secondary | ICD-10-CM | POA: Diagnosis not present

## 2015-05-17 DIAGNOSIS — L817 Pigmented purpuric dermatosis: Secondary | ICD-10-CM | POA: Diagnosis not present

## 2015-05-17 DIAGNOSIS — Z85820 Personal history of malignant melanoma of skin: Secondary | ICD-10-CM | POA: Diagnosis not present

## 2015-05-17 DIAGNOSIS — L820 Inflamed seborrheic keratosis: Secondary | ICD-10-CM | POA: Diagnosis not present

## 2015-05-17 DIAGNOSIS — L821 Other seborrheic keratosis: Secondary | ICD-10-CM | POA: Diagnosis not present

## 2015-05-17 DIAGNOSIS — L570 Actinic keratosis: Secondary | ICD-10-CM | POA: Diagnosis not present

## 2015-05-17 DIAGNOSIS — D1801 Hemangioma of skin and subcutaneous tissue: Secondary | ICD-10-CM | POA: Diagnosis not present

## 2015-07-04 DIAGNOSIS — Z87891 Personal history of nicotine dependence: Secondary | ICD-10-CM | POA: Diagnosis not present

## 2015-07-04 DIAGNOSIS — E039 Hypothyroidism, unspecified: Secondary | ICD-10-CM | POA: Diagnosis not present

## 2015-07-04 DIAGNOSIS — Z8551 Personal history of malignant neoplasm of bladder: Secondary | ICD-10-CM | POA: Diagnosis not present

## 2015-08-16 ENCOUNTER — Ambulatory Visit (INDEPENDENT_AMBULATORY_CARE_PROVIDER_SITE_OTHER): Payer: Medicare Other | Admitting: Medical

## 2015-08-16 ENCOUNTER — Encounter: Payer: Self-pay | Admitting: Medical

## 2015-08-16 VITALS — BP 134/80 | HR 66 | Temp 98.2°F | Resp 17 | Wt 184.1 lb

## 2015-08-16 DIAGNOSIS — J0100 Acute maxillary sinusitis, unspecified: Secondary | ICD-10-CM | POA: Diagnosis not present

## 2015-08-16 DIAGNOSIS — J40 Bronchitis, not specified as acute or chronic: Secondary | ICD-10-CM | POA: Diagnosis not present

## 2015-08-16 MED ORDER — HYDROCODONE-HOMATROPINE 5-1.5 MG/5ML PO SYRP: 5.0000 mL | ORAL_SOLUTION | Freq: Three times a day (TID) | ORAL | Status: DC | PRN

## 2015-08-16 MED ORDER — AZITHROMYCIN 250 MG PO TABS: ORAL_TABLET | ORAL | Status: DC

## 2015-08-16 MED ORDER — FLUTICASONE PROPIONATE 50 MCG/ACT NA SUSP: 2.0000 | Freq: Every day | NASAL | Status: DC

## 2015-08-16 NOTE — Progress Notes (Signed)
Pre visit review using our clinic review tool, if applicable. No additional management support is needed unless otherwise documented below in the visit note. 

## 2015-08-16 NOTE — Progress Notes (Signed)
   Subjective:    Patient ID: Wendy Velazquez, female    DOB: [birth year], 74 y.o.   MRN: [id number]  HPI  Pt in for nasal congestion. Started on Sunday. She has tried antihistamine claritin. This has not helped much. Pt feels a lot of pnd. Then this am some productive mucous on coughing. Some fevers and some sweating. No diffuse achiness. No wheezing and no sob. Some maxillary sinus pressure and ear pressure.    Review of Systems  Constitutional: Negative for fever, chills, diaphoresis, activity change and fatigue.  HENT: Positive for ear pain and sinus pressure.   Respiratory: Positive for cough. Negative for chest tightness and shortness of breath.   Cardiovascular: Negative for chest pain, palpitations and leg swelling.  Gastrointestinal: Negative for nausea, vomiting and abdominal pain.  Musculoskeletal: Negative for neck pain and neck stiffness.  Neurological: Negative for dizziness, tremors, seizures, syncope, facial asymmetry, speech difficulty, weakness, light-headedness, numbness and headaches.  Psychiatric/Behavioral: Negative for behavioral problems, confusion and agitation. The patient is not nervous/anxious.        Objective:   Physical Exam  General  Mental Status - Alert. General Appearance - Well groomed. Not in acute distress.  Skin Rashes- No Rashes.  HEENT Head- Normal. Ear Auditory Canal - Left- Normal. Right - Normal.Tympanic Membrane- Left- Normal. Right- Normal. Eye Sclera/Conjunctiva- Left- Normal. Right- Normal. Nose & Sinuses Nasal Mucosa- Left-  Boggy and Congested. Right-  Boggy and  Congested.Bilateral maxillary and frontal sinus pressure. Mouth & Throat Lips: Upper Lip- Normal: no dryness, cracking, pallor, cyanosis, or vesicular eruption. Lower Lip-Normal: no dryness, cracking, pallor, cyanosis or vesicular eruption. Buccal Mucosa- Bilateral- No Aphthous ulcers. Oropharynx- No Discharge or Erythema. Tonsils: Characteristics- Bilateral- No  Erythema or Congestion. Size/Enlargement- Bilateral- No enlargement. Discharge- bilateral-None.  Neck Neck- Supple. No Masses.   Chest and Lung Exam Auscultation: Breath Sounds:- even and unlabored. Only faint upper lobe rhonchi on ausculatiation.  Cardiovascular Auscultation:Rythm- Regular, rate and rhythm. Murmurs & Other Heart Sounds:Ausculatation of the heart reveal- No Murmurs.  Lymphatic Head & Neck General Head & Neck Lymphatics: Bilateral: Description- No Localized lymphadenopathy.       Assessment & Plan:  Your appear to have a sinus infection(bronchitis possible as well). I am prescribing azithromycin antibiotic for the infections. To help with the nasal congestion I prescribed nasal steroid flonase. For your associated cough, I prescribed cough medicine hydocan.  Rest, hydrate, tylenol for fever.  Follow up in 7 days or as needed.

## 2015-08-16 NOTE — Patient Instructions (Addendum)
Your appear to have a sinus infection(bronchitis possible as well). I am prescribing azithromycin antibiotic for the infections. To help with the nasal congestion I prescribed nasal steroid flonase. For your associated cough, I prescribed cough medicine hydocan.  Rest, hydrate, tylenol for fever.  Follow up in 7 days or as needed.

## 2015-11-13 DIAGNOSIS — Z85820 Personal history of malignant melanoma of skin: Secondary | ICD-10-CM | POA: Diagnosis not present

## 2015-11-13 DIAGNOSIS — D1801 Hemangioma of skin and subcutaneous tissue: Secondary | ICD-10-CM | POA: Diagnosis not present

## 2015-11-20 DIAGNOSIS — Z961 Presence of intraocular lens: Secondary | ICD-10-CM | POA: Diagnosis not present

## 2015-12-18 DIAGNOSIS — L570 Actinic keratosis: Secondary | ICD-10-CM | POA: Diagnosis not present

## 2015-12-18 DIAGNOSIS — D1801 Hemangioma of skin and subcutaneous tissue: Secondary | ICD-10-CM | POA: Diagnosis not present

## 2015-12-18 DIAGNOSIS — L814 Other melanin hyperpigmentation: Secondary | ICD-10-CM | POA: Diagnosis not present

## 2015-12-18 DIAGNOSIS — D225 Melanocytic nevi of trunk: Secondary | ICD-10-CM | POA: Diagnosis not present

## 2015-12-18 DIAGNOSIS — D2239 Melanocytic nevi of other parts of face: Secondary | ICD-10-CM | POA: Diagnosis not present

## 2015-12-18 DIAGNOSIS — L821 Other seborrheic keratosis: Secondary | ICD-10-CM | POA: Diagnosis not present

## 2015-12-18 DIAGNOSIS — Z85820 Personal history of malignant melanoma of skin: Secondary | ICD-10-CM | POA: Diagnosis not present

## 2015-12-18 DIAGNOSIS — L817 Pigmented purpuric dermatosis: Secondary | ICD-10-CM | POA: Diagnosis not present

## 2015-12-18 DIAGNOSIS — B351 Tinea unguium: Secondary | ICD-10-CM | POA: Diagnosis not present

## 2015-12-24 ENCOUNTER — Ambulatory Visit (INDEPENDENT_AMBULATORY_CARE_PROVIDER_SITE_OTHER): Payer: Medicare Other | Admitting: Medical

## 2015-12-24 ENCOUNTER — Encounter: Payer: Self-pay | Admitting: Medical

## 2015-12-24 VITALS — BP 116/78 | HR 86 | Temp 98.1°F | Ht 63.0 in | Wt 186.4 lb

## 2015-12-24 DIAGNOSIS — J0120 Acute ethmoidal sinusitis, unspecified: Secondary | ICD-10-CM

## 2015-12-24 MED ORDER — FLUTICASONE PROPIONATE 50 MCG/ACT NA SUSP: 2.0000 | Freq: Every day | NASAL | Status: DC

## 2015-12-24 MED ORDER — AZITHROMYCIN 250 MG PO TABS
ORAL_TABLET | ORAL | Status: DC
Start: 2015-12-24 — End: 2016-02-10

## 2015-12-24 NOTE — Progress Notes (Signed)
Pre visit review using our clinic review tool, if applicable. No additional management support is needed unless otherwise documented below in the visit note. 

## 2015-12-24 NOTE — Progress Notes (Signed)
Subjective:    Patient ID: Wendy Velazquez, female    DOB: [birth year], 75 y.o.   MRN: [id number]  HPI  2 weeks of nasal congestion, sinus pressure and ear pain. On and off symptoms that wax ans wane 1st week but last week is worse. Some pnd.   Pt tried tylenol, mucinex and airborne  In past with rapid weather changes notes will get nasal congested easily.    Review of Systems  Constitutional: Negative for fever, chills and fatigue.  HENT: Positive for congestion, postnasal drip, sinus pressure, sneezing and sore throat.        Mild scratchy.  Eyes: Negative for itching.  Respiratory: Negative for cough, chest tightness and wheezing.   Cardiovascular: Negative for chest pain and palpitations.  Musculoskeletal: Negative for back pain.  Neurological: Negative for syncope, weakness and headaches.  Hematological: Negative for adenopathy. Does not bruise/bleed easily.    Past Medical History  Diagnosis Date  . Hypertension   . Arthritis   . MRSA (methicillin resistant staph aureus) culture positive   . Rapid heart beat     benigh  . Osteopenia   . Transitional cell carcinoma ([place])   . Melanoma [facility])     Social History   Social History  . Marital Status: Widowed    Spouse Name: N/A  . Number of Children: N/A  . 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 Smoking status: Former Smoker -- 0.30 packs/day for 15 years    Quit date: 03/15/1981  . Smokeless tobacco: Never Used  . Alcohol Use: Yes  . Drug Use: No  . Sexual Activity:    Partners: Male   Other Topics Concern  . Not on file   Social History Narrative   Exercise--- dancing    Past Surgical History  Procedure Laterality Date  . Tonsillectomy    . Abdominal hysterectomy  1987    partial  . Melanoma excision  1977    left leg  . Melanoma excision  06-24-09    r foot  . Cataract extraction  2004  . Skin cancer excision  1999   bladder transitional cell  . Eye surgery      cataract b/l  . Teeth implants      Family History  Problem Relation Age of Onset  . Diabetes Mother   . Alzheimer's disease Mother   . Dementia Mother   . Stroke Father   . Hypertension Father     Allergies  Allergen Reactions  . Antihistamines, Diphenhydramine-Type Tinitus  . Codeine     Nausea  Can take Hydromet    Current Outpatient Prescriptions on File Prior to Visit  Medication Sig Dispense Refill  . acetaminophen (TYLENOL ARTHRITIS PAIN) 650 MG CR tablet Take 650 mg by mouth at bedtime.      Marland Kitchen amLODipine (NORVASC) 5 MG tablet Take 1 tablet (5 mg total) by mouth daily. 90 tablet 3  . Digestive Enzymes (ENZYMATIC DIGESTANT PO) Take by mouth. Enzymatic Therapy probiotic pearls    . estropipate (OGEN) 0.75 MG tablet Take 1 tablet (0.75 mg total) by mouth daily. 90 tablet 3  . Glucos-MSM-C-Mn-Ginger-Willow (GLUCOSAMINE MSM COMPLEX PO) Take 1 tablet by mouth daily.    Marland Kitchen HYDROcodone-homatropine (HYCODAN) 5-1.5 MG/5ML syrup Take 5 mLs by mouth every 8 (eight) hours as needed for cough. 120 mL 0  . medroxyPROGESTERone (PROVERA) 5  MG tablet 1/2 tab po qd 10 days a month as directed.  Brand name necessary. 90 tablet 3  . meloxicam (MOBIC) 15 MG tablet TAKE 1/2 TO 1 TABLET ONCE  DAILY 90 tablet 1  . propranolol (INDERAL) 10 MG tablet Take 1 tablet (10 mg total) by mouth 2 (two) times daily. 180 tablet 3  . ranitidine (ZANTAC 75) 75 MG tablet Take 75 mg by mouth daily.      Marland Kitchen spironolactone (ALDACTONE) 25 MG tablet 1/2 tab po qd 135 tablet 3  . SYNTHROID 75 MCG tablet Take 1 tablet (75 mcg total) by mouth daily. Brand name necessary 90 tablet 3   No current facility-administered medications on file prior to visit.    BP 116/78 mmHg  Pulse 86  Temp(Src) 98.1 F (36.7 C) (Oral)  Ht 5\' 3"  (1.6 m)  Wt 186 lb 6.4 oz (84.55 kg)  BMI 33.03 kg/m2  SpO2 98%          Objective:   Physical Exam  General  Mental Status - Alert.  General Appearance - Well groomed. Not in acute distress.  Skin Rashes- No Rashes.  HEENT Head- Normal. Ear Auditory Canal - Left- Normal. Right - Normal.Tympanic Membrane- Left- Normal. Right- Normal. Eye Sclera/Conjunctiva- Left- Normal. Right- Normal. Nose & Sinuses Nasal Mucosa- Left-  Boggy and Congested. Right-  Boggy and  Congested.Bilateral no maxillary and no frontal sinus pressure. But ethmoid sinus are tender. Mouth & Throat Lips: Upper Lip- Normal: no dryness, cracking, pallor, cyanosis, or vesicular eruption. Lower Lip-Normal: no dryness, cracking, pallor, cyanosis or vesicular eruption. Buccal Mucosa- Bilateral- No Aphthous ulcers. Oropharynx- No Discharge or Erythema. +pnd Tonsils: Characteristics- Bilateral- No Erythema or Congestion. Size/Enlargement- Bilateral- No enlargement. Discharge- bilateral-None.  Neck Neck- Supple. No Masses.   Chest and Lung Exam Auscultation: Breath Sounds:-Clear even and unlabored.  Cardiovascular Auscultation:Rythm- Regular, rate and rhythm. Murmurs & Other Heart Sounds:Ausculatation of the heart reveal- No Murmurs.  Lymphatic Head & Neck General Head & Neck Lymphatics: Bilateral: Description- No Localized lymphadenopathy.       Assessment & Plan:  You appear to have a sinus infection. I am prescribing  Azithromycin antibiotic for the infection. To help with the nasal congestion I prescribed  flonase nasal steroid.   Rest, hydrate, tylenol for fever.  Follow up in 7 days or as needed.

## 2015-12-24 NOTE — Patient Instructions (Addendum)
You appear to have a sinus infection. I am prescribing  Azithromycin antibiotic for the infection. To help with the nasal congestion I prescribed  flonase nasal steroid.   Rest, hydrate, tylenol for fever.  Follow up in 7 days or as needed.

## 2016-01-14 ENCOUNTER — Telehealth: Payer: Self-pay | Admitting: Family Medicine

## 2016-01-14 NOTE — Telephone Encounter (Signed)
Patient declined Flu Shot °

## 2016-01-14 NOTE — Telephone Encounter (Signed)
Updated.      KP 

## 2016-01-24 ENCOUNTER — Telehealth: Payer: Self-pay | Admitting: *Deleted

## 2016-01-24 NOTE — Telephone Encounter (Signed)
Received Admission forms to be completed for Wendy Velazquez, will complete as much as possible and then forward to provider/SLS 03/10

## 2016-02-05 ENCOUNTER — Telehealth: Payer: Self-pay | Admitting: Family Medicine

## 2016-02-05 NOTE — Telephone Encounter (Signed)
Please advise      KP 

## 2016-02-05 NOTE — Telephone Encounter (Signed)
She needs one according to her paperwork given to [facility] for [facility] living

## 2016-02-05 NOTE — Telephone Encounter (Signed)
Caller name: Self  Can be reached: [phone]   Reason for call: Is requesting a TB Skin Test. Plse adv

## 2016-02-06 NOTE — Telephone Encounter (Signed)
Please schedule.    KP 

## 2016-02-07 NOTE — Telephone Encounter (Signed)
Appointment scheduled for patient

## 2016-02-10 ENCOUNTER — Ambulatory Visit (INDEPENDENT_AMBULATORY_CARE_PROVIDER_SITE_OTHER): Payer: Medicare Other | Admitting: Medical

## 2016-02-10 ENCOUNTER — Encounter: Payer: Self-pay | Admitting: Medical

## 2016-02-10 VITALS — BP 116/74 | HR 60 | Temp 98.1°F | Ht 63.0 in | Wt 186.4 lb

## 2016-02-10 DIAGNOSIS — J0100 Acute maxillary sinusitis, unspecified: Secondary | ICD-10-CM | POA: Diagnosis not present

## 2016-02-10 DIAGNOSIS — H6692 Otitis media, unspecified, left ear: Secondary | ICD-10-CM

## 2016-02-10 DIAGNOSIS — J309 Allergic rhinitis, unspecified: Secondary | ICD-10-CM | POA: Diagnosis not present

## 2016-02-10 MED ORDER — CEFDINIR 300 MG PO CAPS: 300.0000 mg | ORAL_CAPSULE | Freq: Two times a day (BID) | ORAL | Status: DC

## 2016-02-10 MED ORDER — HYDROCODONE-HOMATROPINE 5-1.5 MG/5ML PO SYRP: 5.0000 mL | ORAL_SOLUTION | Freq: Three times a day (TID) | ORAL | Status: DC | PRN

## 2016-02-10 MED FILL — CEFDINIR 300 MG CAPSULE: 300 | 10 days supply | Qty: 20 | Fill #0

## 2016-02-10 NOTE — Telephone Encounter (Signed)
Cancelled rx of cefdnir  [facility].

## 2016-02-10 NOTE — Progress Notes (Signed)
Pre visit review using our clinic review tool, if applicable. No additional management support is needed unless otherwise documented below in the visit note. 

## 2016-02-10 NOTE — Patient Instructions (Addendum)
For allergic rhinitis continue flonase.  For sinus infection and OM, Rx cefdnir  antibiotic.  For cough rx hycodan.  Follow up 7 days or as needed

## 2016-02-10 NOTE — Progress Notes (Signed)
Subjective:    Patient ID: Wendy Velazquez, female    DOB: Mar 25, 1941, 75 y.o.   MRN: [id number]  HPI  Sore throat and ear pain last 2 days. Nasal congestion, runny nose, and some sneezing past week.  Pt has been coughing moderate to severe last night. No wheezing.  Pt states old rx hycodan and about to run out.  Pt is using flonase.  Review of Systems  Constitutional: Negative for fever, chills and fatigue.  HENT: Positive for congestion, ear pain, postnasal drip, sinus pressure and sore throat.   Respiratory: Positive for cough. Negative for chest tightness, shortness of breath and wheezing.   Cardiovascular: Negative for chest pain and palpitations.  Gastrointestinal: Negative for abdominal pain.  Musculoskeletal: Negative for myalgias and back pain.  Neurological: Negative for dizziness and headaches.  Hematological: Negative for adenopathy. Does not bruise/bleed easily.  Psychiatric/Behavioral: Negative for behavioral problems and confusion.     Past Medical History  Diagnosis Date  . Hypertension   . Arthritis   . MRSA (methicillin resistant staph aureus) culture positive   . Rapid heart beat     benigh  . Osteopenia   . Transitional cell carcinoma ([place])   . Melanoma [facility])     Social History   Social History  . Marital Status: Widowed    Spouse Name: N/A  . Number of Children: N/A  . 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 Smoking status: Former Smoker -- 0.30 packs/day for 15 years    Quit date: 03/15/1981  . Smokeless tobacco: Never Used  . Alcohol Use: Yes  . Drug Use: No  . Sexual Activity:    Partners: Male   Other Topics Concern  . Not on file   Social History Narrative   Exercise--- dancing    Past Surgical History  Procedure Laterality Date  . Tonsillectomy    . Abdominal hysterectomy  1987    partial  . Melanoma excision  1977    left leg  . Melanoma  excision  06-24-09    r foot  . Cataract extraction  2004  . Skin cancer excision  1999    bladder transitional cell  . Eye surgery      cataract b/l  . Teeth implants      Family History  Problem Relation Age of Onset  . Diabetes Mother   . Alzheimer's disease Mother   . Dementia Mother   . Stroke Father   . Hypertension Father     Allergies  Allergen Reactions  . Antihistamines, Diphenhydramine-Type Tinitus  . Codeine     Nausea  Can take Hydromet    Current Outpatient Prescriptions on File Prior to Visit  Medication Sig Dispense Refill  . acetaminophen (TYLENOL ARTHRITIS PAIN) 650 MG CR tablet Take 650 mg by mouth at bedtime.      Marland Kitchen amLODipine (NORVASC) 5 MG tablet Take 1 tablet (5 mg total) by mouth daily. 90 tablet 3  . Digestive Enzymes (ENZYMATIC DIGESTANT PO) Take by mouth. Enzymatic Therapy probiotic pearls    . estropipate (OGEN) 0.75 MG tablet Take 1 tablet (0.75 mg total) by mouth daily. 90 tablet 3  . fluticasone (FLONASE) 50 MCG/ACT nasal spray Place 2 sprays into both nostrils daily. 16 g 1  . Glucos-MSM-C-Mn-Ginger-Willow (GLUCOSAMINE MSM COMPLEX PO) Take 1 tablet by mouth daily.    Marland Kitchen HYDROcodone-homatropine (HYCODAN)  5-1.5 MG/5ML syrup Take 5 mLs by mouth every 8 (eight) hours as needed for cough. 120 mL 0  . medroxyPROGESTERone (PROVERA) 5 MG tablet 1/2 tab po qd 10 days a month as directed.  Brand name necessary. 90 tablet 3  . meloxicam (MOBIC) 15 MG tablet TAKE 1/2 TO 1 TABLET ONCE  DAILY 90 tablet 1  . propranolol (INDERAL) 10 MG tablet Take 1 tablet (10 mg total) by mouth 2 (two) times daily. 180 tablet 3  . ranitidine (ZANTAC 75) 75 MG tablet Take 75 mg by mouth daily.      Marland Kitchen spironolactone (ALDACTONE) 25 MG tablet 1/2 tab po qd 135 tablet 3  . SYNTHROID 75 MCG tablet Take 1 tablet (75 mcg total) by mouth daily. Brand name necessary 90 tablet 3   No current facility-administered medications on file prior to visit.    BP 116/74 mmHg  Pulse 60   Temp(Src) 98.1 F (36.7 C) (Oral)  Ht 5\' 3"  (1.6 m)  Wt 186 lb 6.4 oz (84.55 kg)  BMI 33.03 kg/m2  SpO2 97%       Objective:   Physical Exam  General  Mental Status - Alert. General Appearance - Well groomed. Not in acute distress.  Skin Rashes- No Rashes.  HEENT Head- Normal. Ear Auditory Canal - Left- Normal. Right - Normal.Tympanic Membrane- Left- moderate bright red. Right- Normal. Eye Sclera/Conjunctiva- Left- Normal. Right- Normal. Nose & Sinuses Nasal Mucosa- Left-  Boggy and Congested. Right-  Boggy and  Congested.lt  maxillary but no frontal sinus pressure. Mouth & Throat Lips: Upper Lip- Normal: no dryness, cracking, pallor, cyanosis, or vesicular eruption. Lower Lip-Normal: no dryness, cracking, pallor, cyanosis or vesicular eruption. Buccal Mucosa- Bilateral- No Aphthous ulcers. Oropharynx- No Discharge or Erythema. +pnd Tonsils: Characteristics- Bilateral- No Erythema or Congestion. Size/Enlargement- Bilateral- No enlargement. Discharge- bilateral-None.  Neck Neck- Supple. No Masses.   Chest and Lung Exam Auscultation: Breath Sounds:-Clear even and unlabored.  Cardiovascular Auscultation:Rythm- Regular, rate and rhythm. Murmurs & Other Heart Sounds:Ausculatation of the heart reveal- No Murmurs.  Lymphatic Head & Neck General Head & Neck Lymphatics: Bilateral: Description- No Localized lymphadenopathy.       Assessment & Plan:  For allergic rhinitis continue flonase.  For sinus infection and OM, Rx cefdnir  antibiotic.  For cough rx hycodan.  Follow up 7 days or as needed

## 2016-02-11 ENCOUNTER — Ambulatory Visit (INDEPENDENT_AMBULATORY_CARE_PROVIDER_SITE_OTHER): Payer: Medicare Other | Admitting: *Deleted

## 2016-02-11 DIAGNOSIS — Z111 Encounter for screening for respiratory tuberculosis: Secondary | ICD-10-CM

## 2016-02-11 NOTE — Progress Notes (Signed)
Pre visit review using our clinic review tool, if applicable. No additional management support is needed unless otherwise documented below in the visit note.  Pt here for TB skin test, ok per 02/05/16 phone note.  Test placed LFA. Pt tolerated well. Pt instructed to return within 48-72 hours to have test read.   Dorrene German, RN

## 2016-02-12 NOTE — Telephone Encounter (Signed)
Rockwell Germany, CMA at 01/24/2016 9:21 AM     Status: Signed       Expand All Collapse All   Received Admission forms to be completed for Wendy Velazquez, will complete as much as possible and then forward to provider/SLS 03/10       Spoke with patient via telephone about paperwork needing TB test placement [due to time constraint] and patient stated that she would have it done at [facility] told her they would place it and fax [place] results], being that it would be more convenient for her/SLS

## 2016-02-12 NOTE — Telephone Encounter (Addendum)
Received fax from [facility] stating that patient needs two-step PPD process; spoke with Admissions office to confirm, first placement done on 02/11/16; we will place second on Tues, 02/18/15 [on nurse schedule, but coming by in AM and is to ask for me]/SLS 03/29

## 2016-02-13 ENCOUNTER — Encounter: Payer: Self-pay | Admitting: Family Medicine

## 2016-02-13 NOTE — Telephone Encounter (Signed)
error 

## 2016-02-14 ENCOUNTER — Encounter: Payer: Self-pay | Admitting: Behavioral Health

## 2016-02-14 LAB — TB SKIN TEST
INDURATION: 0 mm
TB SKIN TEST: NEGATIVE

## 2016-02-14 NOTE — Telephone Encounter (Signed)
Wendy Velazquez, CMA at 02/14/2016 8:52 AM     Status: Signed       Expand All Collapse All   Spoke with patient regarding need for PPD [two-step] after speaking with Admission at [facility]; had first placement on Tuesday and will come in for reading on Friday. I scheduled second placement for Tuesday, 02/18/16; patient is scheduled in late afternoon on Nurse schedule, but will actually be coming in that morning between 8 & 9am and will ask for myself and I will place step-two of the PPD/SLS 03/29           Wendy Velazquez, CMA at 01/24/2016 9:21 AM     Status: Signed       Expand All Collapse All   Received Admission forms to be completed for [place], will complete as much as possible and then forward to provider/SLS 03/10

## 2016-02-14 NOTE — Telephone Encounter (Signed)
Missed conversation with Ellwood Sayers  Reply all   Today, 8:48 AM  Claudia Desanctis  Noted. As I am out of the office on Thursdays, I will call patient and clarify our previous phone conversation, that she does not need appointment for PPD Reading today; but she has to have two-step placement; therefore, she will need to keep here apt on Tuesday for her second placement, as I will be placing it when she comes in that morning ,as I had to schedule her late in the afternoon to get her on the schedule, and as her appointment has the notation to ask for myself when she arrives. Please do not cancel apt scheduled for Tuesday. Thanks!  Ivin Booty . Press the Enter key to open the contact card."[email or web address]>    Yesterday, 9:06 AM  Gloriann Loan, Tiffany 9:04 AM:  Burgess Amor is on the phone regarding the injection appointment you scheduled  she states she was advised she didnt need an appointment that she can jus come in and they would read

## 2016-02-14 NOTE — Telephone Encounter (Signed)
Spoke with patient regarding need for PPD [two-step] after speaking with Admission at [facility]; had first placement on Tuesday and will come in for reading on Friday. I scheduled second placement for Tuesday, 02/18/16; patient is scheduled in late afternoon on Nurse schedule, but will actually be coming in that morning between 8 & 9am and will ask for myself and I will place step-two of the PPD/SLS 03/29

## 2016-02-18 ENCOUNTER — Ambulatory Visit: Payer: Medicare Other

## 2016-02-18 DIAGNOSIS — Z111 Encounter for screening for respiratory tuberculosis: Secondary | ICD-10-CM

## 2016-02-21 ENCOUNTER — Encounter: Payer: Self-pay | Admitting: Behavioral Health

## 2016-02-21 LAB — TB SKIN TEST
Induration: 0 mm
TB SKIN TEST: NEGATIVE

## 2016-02-26 NOTE — Telephone Encounter (Signed)
Patient presented to have [2nd] PPD read on 02/21/16; Negative, paperwork faxed to [facility] Admissions/SLS 04/07

## 2016-03-26 ENCOUNTER — Other Ambulatory Visit: Payer: Self-pay | Admitting: Family Medicine

## 2016-03-26 NOTE — Telephone Encounter (Signed)
Refilled patient medication with #90 and 3 rf of amlodipine and #180 and # refill on the propanal.

## 2016-03-31 ENCOUNTER — Other Ambulatory Visit: Payer: Self-pay | Admitting: Family Medicine

## 2016-04-21 ENCOUNTER — Ambulatory Visit (INDEPENDENT_AMBULATORY_CARE_PROVIDER_SITE_OTHER): Payer: Medicare Other | Admitting: Family Medicine

## 2016-04-21 ENCOUNTER — Encounter: Payer: Self-pay | Admitting: Family Medicine

## 2016-04-21 VITALS — BP 136/82 | HR 61 | Temp 98.0°F | Ht 62.0 in | Wt 184.2 lb

## 2016-04-21 DIAGNOSIS — I10 Essential (primary) hypertension: Secondary | ICD-10-CM

## 2016-04-21 DIAGNOSIS — M150 Primary generalized (osteo)arthritis: Secondary | ICD-10-CM | POA: Diagnosis not present

## 2016-04-21 DIAGNOSIS — Z780 Asymptomatic menopausal state: Secondary | ICD-10-CM | POA: Diagnosis not present

## 2016-04-21 DIAGNOSIS — Z23 Encounter for immunization: Secondary | ICD-10-CM

## 2016-04-21 DIAGNOSIS — R319 Hematuria, unspecified: Secondary | ICD-10-CM

## 2016-04-21 DIAGNOSIS — Z0000 Encounter for general adult medical examination without abnormal findings: Secondary | ICD-10-CM | POA: Diagnosis not present

## 2016-04-21 DIAGNOSIS — E039 Hypothyroidism, unspecified: Secondary | ICD-10-CM | POA: Diagnosis not present

## 2016-04-21 DIAGNOSIS — M159 Polyosteoarthritis, unspecified: Secondary | ICD-10-CM

## 2016-04-21 LAB — POCT URINALYSIS DIPSTICK
Bilirubin, UA: NEGATIVE
Glucose, UA: NEGATIVE
KETONES UA: NEGATIVE
LEUKOCYTES UA: NEGATIVE
NITRITE UA: NEGATIVE
PH UA: 6
PROTEIN UA: NEGATIVE
Spec Grav, UA: 1.025
Urobilinogen, UA: 0.2

## 2016-04-21 LAB — CBC WITH DIFFERENTIAL/PLATELET
Basophils Absolute: 0 10*3/uL (ref 0.0–0.1)
Basophils Relative: 0.4 % (ref 0.0–3.0)
EOS PCT: 2.1 % (ref 0.0–5.0)
Eosinophils Absolute: 0.2 10*3/uL (ref 0.0–0.7)
HCT: 43.8 % (ref 36.0–46.0)
Hemoglobin: 14.7 g/dL (ref 12.0–15.0)
LYMPHS ABS: 1.9 10*3/uL (ref 0.7–4.0)
Lymphocytes Relative: 23.8 % (ref 12.0–46.0)
MCHC: 33.5 g/dL (ref 30.0–36.0)
MCV: 90.5 fl (ref 78.0–100.0)
MONOS PCT: 6.3 % (ref 3.0–12.0)
Monocytes Absolute: 0.5 10*3/uL (ref 0.1–1.0)
NEUTROS ABS: 5.3 10*3/uL (ref 1.4–7.7)
NEUTROS PCT: 67.4 % (ref 43.0–77.0)
PLATELETS: 216 10*3/uL (ref 150.0–400.0)
RBC: 4.85 Mil/uL (ref 3.87–5.11)
RDW: 13.5 % (ref 11.5–15.5)
WBC: 7.9 10*3/uL (ref 4.0–10.5)

## 2016-04-21 LAB — COMPREHENSIVE METABOLIC PANEL
ALT: 18 U/L (ref 0–35)
AST: 18 U/L (ref 0–37)
Albumin: 4.6 g/dL (ref 3.5–5.2)
Alkaline Phosphatase: 86 U/L (ref 39–117)
BUN: 19 mg/dL (ref 6–23)
CO2: 32 meq/L (ref 19–32)
Calcium: 9.6 mg/dL (ref 8.4–10.5)
Chloride: 101 mEq/L (ref 96–112)
Creatinine, Ser: 0.98 mg/dL (ref 0.40–1.20)
GFR: 58.99 mL/min — AB (ref >60.00)
GLUCOSE: 111 mg/dL — AB (ref 70–99)
POTASSIUM: 4.3 meq/L (ref 3.5–5.1)
Sodium: 139 mEq/L (ref 135–145)
TOTAL PROTEIN: 7.1 g/dL (ref 6.0–8.3)
Total Bilirubin: 1 mg/dL (ref 0.2–1.2)

## 2016-04-21 LAB — LIPID PANEL
Cholesterol: 176 mg/dL (ref 0–200)
HDL: 46.5 mg/dL (ref >39.00)
LDL Cholesterol: 96 mg/dL (ref 0–99)
NONHDL: 129.01
Total CHOL/HDL Ratio: 4
Triglycerides: 164 mg/dL — ABNORMAL HIGH (ref 0.0–149.0)
VLDL: 32.8 mg/dL (ref 0.0–40.0)

## 2016-04-21 LAB — TSH: TSH: 1.72 u[IU]/mL (ref 0.35–4.50)

## 2016-04-21 MED ORDER — SPIRONOLACTONE 25 MG PO TABS: ORAL_TABLET | ORAL | Status: DC

## 2016-04-21 MED ORDER — PROPRANOLOL HCL 10 MG PO TABS: 10.0000 mg | ORAL_TABLET | Freq: Two times a day (BID) | ORAL | Status: DC

## 2016-04-21 MED ORDER — MELOXICAM 15 MG PO TABS: ORAL_TABLET | ORAL | Status: DC

## 2016-04-21 MED ORDER — MEDROXYPROGESTERONE ACETATE 5 MG PO TABS: ORAL_TABLET | ORAL | Status: DC

## 2016-04-21 MED ORDER — AMLODIPINE BESYLATE 5 MG PO TABS: 5.0000 mg | ORAL_TABLET | Freq: Every day | ORAL | Status: DC

## 2016-04-21 MED ORDER — ESTROPIPATE 0.75 MG PO TABS: 0.7500 mg | ORAL_TABLET | Freq: Every day | ORAL | Status: DC

## 2016-04-21 NOTE — Addendum Note (Signed)
Addended by: Ewing Schlein on: 04/21/2016 12:11 PM   Modules accepted: Orders

## 2016-04-21 NOTE — Patient Instructions (Signed)
Preventive Care for Adults, Female A healthy lifestyle and preventive care can promote health and wellness. Preventive health guidelines for women include the following key practices.  A routine yearly physical is a good way to check with your health care provider about your health and preventive screening. It is a chance to share any concerns and updates on your health and to receive a thorough exam.  Visit your dentist for a routine exam and preventive care every 6 months. Brush your teeth twice a day and floss once a day. Good oral hygiene prevents tooth decay and gum disease.  The frequency of eye exams is based on your age, health, family medical history, use of contact lenses, and other factors. Follow your health care provider's recommendations for frequency of eye exams.  Eat a healthy diet. Foods like vegetables, fruits, whole grains, low-fat dairy products, and lean protein foods contain the nutrients you need without too many calories. Decrease your intake of foods high in solid fats, added sugars, and salt. Eat the right amount of calories for you.Get information about a proper diet from your health care provider, if necessary.  Regular physical exercise is one of the most important things you can do for your health. Most adults should get at least 150 minutes of moderate-intensity exercise (any activity that increases your heart rate and causes you to sweat) each week. In addition, most adults need muscle-strengthening exercises on 2 or more days a week.  Maintain a healthy weight. The body mass index (BMI) is a screening tool to identify possible weight problems. It provides an estimate of body fat based on height and weight. Your health care provider can find your BMI and can help you achieve or maintain a healthy weight.For adults 20 years and older:  A BMI below 18.5 is considered underweight.  A BMI of 18.5 to 24.9 is normal.  A BMI of 25 to 29.9 is considered overweight.  A  BMI of 30 and above is considered obese.  Maintain normal blood lipids and cholesterol levels by exercising and minimizing your intake of saturated fat. Eat a balanced diet with plenty of fruit and vegetables. Blood tests for lipids and cholesterol should begin at age 45 and be repeated every 5 years. If your lipid or cholesterol levels are high, you are over 50, or you are at high risk for heart disease, you may need your cholesterol levels checked more frequently.Ongoing high lipid and cholesterol levels should be treated with medicines if diet and exercise are not working.  If you smoke, find out from your health care provider how to quit. If you do not use tobacco, do not start.  Lung cancer screening is recommended for adults aged 45-80 years who are at high risk for developing lung cancer because of a history of smoking. A yearly low-dose CT scan of the lungs is recommended for people who have at least a 30-pack-year history of smoking and are a current smoker or have quit within the past 15 years. A pack year of smoking is smoking an average of 1 pack of cigarettes a day for 1 year (for example: 1 pack a day for 30 years or 2 packs a day for 15 years). Yearly screening should continue until the smoker has stopped smoking for at least 15 years. Yearly screening should be stopped for people who develop a health problem that would prevent them from having lung cancer treatment.  If you are pregnant, do not drink alcohol. If you are  breastfeeding, be very cautious about drinking alcohol. If you are not pregnant and choose to drink alcohol, do not have more than 1 drink per day. One drink is considered to be 12 ounces (355 mL) of beer, 5 ounces (148 mL) of wine, or 1.5 ounces (44 mL) of liquor.  Avoid use of street drugs. Do not share needles with anyone. Ask for help if you need support or instructions about stopping the use of drugs.  High blood pressure causes heart disease and increases the risk  of stroke. Your blood pressure should be checked at least every 1 to 2 years. Ongoing high blood pressure should be treated with medicines if weight loss and exercise do not work.  If you are 55-79 years old, ask your health care provider if you should take aspirin to prevent strokes.  Diabetes screening is done by taking a blood sample to check your blood glucose level after you have not eaten for a certain period of time (fasting). If you are not overweight and you do not have risk factors for diabetes, you should be screened once every 3 years starting at age 45. If you are overweight or obese and you are 40-70 years of age, you should be screened for diabetes every year as part of your cardiovascular risk assessment.  Breast cancer screening is essential preventive care for women. You should practice "breast self-awareness." This means understanding the normal appearance and feel of your breasts and may include breast self-examination. Any changes detected, no matter how small, should be reported to a health care provider. Women in their 20s and 30s should have a clinical breast exam (CBE) by a health care provider as part of a regular health exam every 1 to 3 years. After age 40, women should have a CBE every year. Starting at age 40, women should consider having a mammogram (breast X-ray test) every year. Women who have a family history of breast cancer should talk to their health care provider about genetic screening. Women at a high risk of breast cancer should talk to their health care providers about having an MRI and a mammogram every year.  Breast cancer gene (BRCA)-related cancer risk assessment is recommended for women who have family members with BRCA-related cancers. BRCA-related cancers include breast, ovarian, tubal, and peritoneal cancers. Having family members with these cancers may be associated with an increased risk for harmful changes (mutations) in the breast cancer genes BRCA1 and  BRCA2. Results of the assessment will determine the need for genetic counseling and BRCA1 and BRCA2 testing.  Your health care provider may recommend that you be screened regularly for cancer of the pelvic organs (ovaries, uterus, and vagina). This screening involves a pelvic examination, including checking for microscopic changes to the surface of your cervix (Pap test). You may be encouraged to have this screening done every 3 years, beginning at age 21.  For women ages 30-65, health care providers may recommend pelvic exams and Pap testing every 3 years, or they may recommend the Pap and pelvic exam, combined with testing for human papilloma virus (HPV), every 5 years. Some types of HPV increase your risk of cervical cancer. Testing for HPV may also be done on women of any age with unclear Pap test results.  Other health care providers may not recommend any screening for nonpregnant women who are considered low risk for pelvic cancer and who do not have symptoms. Ask your health care provider if a screening pelvic exam is right for   you.  If you have had past treatment for cervical cancer or a condition that could lead to cancer, you need Pap tests and screening for cancer for at least 20 years after your treatment. If Pap tests have been discontinued, your risk factors (such as having a new sexual partner) need to be reassessed to determine if screening should resume. Some women have medical problems that increase the chance of getting cervical cancer. In these cases, your health care provider may recommend more frequent screening and Pap tests.  Colorectal cancer can be detected and often prevented. Most routine colorectal cancer screening begins at the age of 50 years and continues through age 75 years. However, your health care provider may recommend screening at an earlier age if you have risk factors for colon cancer. On a yearly basis, your health care provider may provide home test kits to check  for hidden blood in the stool. Use of a small camera at the end of a tube, to directly examine the colon (sigmoidoscopy or colonoscopy), can detect the earliest forms of colorectal cancer. Talk to your health care provider about this at age 50, when routine screening begins. Direct exam of the colon should be repeated every 5-10 years through age 75 years, unless early forms of precancerous polyps or small growths are found.  People who are at an increased risk for hepatitis B should be screened for this virus. You are considered at high risk for hepatitis B if:  You were born in a country where hepatitis B occurs often. Talk with your health care provider about which countries are considered high risk.  Your parents were born in a high-risk country and you have not received a shot to protect against hepatitis B (hepatitis B vaccine).  You have HIV or AIDS.  You use needles to inject street drugs.  You live with, or have sex with, someone who has hepatitis B.  You get hemodialysis treatment.  You take certain medicines for conditions like cancer, organ transplantation, and autoimmune conditions.  Hepatitis C blood testing is recommended for all people born from 1945 through 1965 and any individual with known risks for hepatitis C.  Practice safe sex. Use condoms and avoid high-risk sexual practices to reduce the spread of sexually transmitted infections (STIs). STIs include gonorrhea, chlamydia, syphilis, trichomonas, herpes, HPV, and human immunodeficiency virus (HIV). Herpes, HIV, and HPV are viral illnesses that have no cure. They can result in disability, cancer, and death.  You should be screened for sexually transmitted illnesses (STIs) including gonorrhea and chlamydia if:  You are sexually active and are younger than 24 years.  You are older than 24 years and your health care provider tells you that you are at risk for this type of infection.  Your sexual activity has changed  since you were last screened and you are at an increased risk for chlamydia or gonorrhea. Ask your health care provider if you are at risk.  If you are at risk of being infected with HIV, it is recommended that you take a prescription medicine daily to prevent HIV infection. This is called preexposure prophylaxis (PrEP). You are considered at risk if:  You are sexually active and do not regularly use condoms or know the HIV status of your partner(s).  You take drugs by injection.  You are sexually active with a partner who has HIV.  Talk with your health care provider about whether you are at high risk of being infected with HIV. If   you choose to begin PrEP, you should first be tested for HIV. You should then be tested every 3 months for as long as you are taking PrEP.  Osteoporosis is a disease in which the bones lose minerals and strength with aging. This can result in serious bone fractures or breaks. The risk of osteoporosis can be identified using a bone density scan. Women ages 67 years and over and women at risk for fractures or osteoporosis should discuss screening with their health care providers. Ask your health care provider whether you should take a calcium supplement or vitamin D to reduce the rate of osteoporosis.  Menopause can be associated with physical symptoms and risks. Hormone replacement therapy is available to decrease symptoms and risks. You should talk to your health care provider about whether hormone replacement therapy is right for you.  Use sunscreen. Apply sunscreen liberally and repeatedly throughout the day. You should seek shade when your shadow is shorter than you. Protect yourself by wearing long sleeves, pants, a wide-brimmed hat, and sunglasses year round, whenever you are outdoors.  Once a month, do a whole body skin exam, using a mirror to look at the skin on your back. Tell your health care provider of new moles, moles that have irregular borders, moles that  are larger than a pencil eraser, or moles that have changed in shape or color.  Stay current with required vaccines (immunizations).  Influenza vaccine. All adults should be immunized every year.  Tetanus, diphtheria, and acellular pertussis (Td, Tdap) vaccine. Pregnant women should receive 1 dose of Tdap vaccine during each pregnancy. The dose should be obtained regardless of the length of time since the last dose. Immunization is preferred during the 27th-36th week of gestation. An adult who has not previously received Tdap or who does not know her vaccine status should receive 1 dose of Tdap. This initial dose should be followed by tetanus and diphtheria toxoids (Td) booster doses every 10 years. Adults with an unknown or incomplete history of completing a 3-dose immunization series with Td-containing vaccines should begin or complete a primary immunization series including a Tdap dose. Adults should receive a Td booster every 10 years.  Varicella vaccine. An adult without evidence of immunity to varicella should receive 2 doses or a second dose if she has previously received 1 dose. Pregnant females who do not have evidence of immunity should receive the first dose after pregnancy. This first dose should be obtained before leaving the health care facility. The second dose should be obtained 4-8 weeks after the first dose.  Human papillomavirus (HPV) vaccine. Females aged 13-26 years who have not received the vaccine previously should obtain the 3-dose series. The vaccine is not recommended for use in pregnant females. However, pregnancy testing is not needed before receiving a dose. If a female is found to be pregnant after receiving a dose, no treatment is needed. In that case, the remaining doses should be delayed until after the pregnancy. Immunization is recommended for any person with an immunocompromised condition through the age of 61 years if she did not get any or all doses earlier. During the  3-dose series, the second dose should be obtained 4-8 weeks after the first dose. The third dose should be obtained 24 weeks after the first dose and 16 weeks after the second dose.  Zoster vaccine. One dose is recommended for adults aged 30 years or older unless certain conditions are present.  Measles, mumps, and rubella (MMR) vaccine. Adults born  before 1957 generally are considered immune to measles and mumps. Adults born in 1957 or later should have 1 or more doses of MMR vaccine unless there is a contraindication to the vaccine or there is laboratory evidence of immunity to each of the three diseases. A routine second dose of MMR vaccine should be obtained at least 28 days after the first dose for students attending postsecondary schools, health care workers, or international travelers. People who received inactivated measles vaccine or an unknown type of measles vaccine during 1963-1967 should receive 2 doses of MMR vaccine. People who received inactivated mumps vaccine or an unknown type of mumps vaccine before 1979 and are at high risk for mumps infection should consider immunization with 2 doses of MMR vaccine. For females of childbearing age, rubella immunity should be determined. If there is no evidence of immunity, females who are not pregnant should be vaccinated. If there is no evidence of immunity, females who are pregnant should delay immunization until after pregnancy. Unvaccinated health care workers born before 1957 who lack laboratory evidence of measles, mumps, or rubella immunity or laboratory confirmation of disease should consider measles and mumps immunization with 2 doses of MMR vaccine or rubella immunization with 1 dose of MMR vaccine.  Pneumococcal 13-valent conjugate (PCV13) vaccine. When indicated, a person who is uncertain of his immunization history and has no record of immunization should receive the PCV13 vaccine. All adults 65 years of age and older should receive this  vaccine. An adult aged 19 years or older who has certain medical conditions and has not been previously immunized should receive 1 dose of PCV13 vaccine. This PCV13 should be followed with a dose of pneumococcal polysaccharide (PPSV23) vaccine. Adults who are at high risk for pneumococcal disease should obtain the PPSV23 vaccine at least 8 weeks after the dose of PCV13 vaccine. Adults older than 74 years of age who have normal immune system function should obtain the PPSV23 vaccine dose at least 1 year after the dose of PCV13 vaccine.  Pneumococcal polysaccharide (PPSV23) vaccine. When PCV13 is also indicated, PCV13 should be obtained first. All adults aged 65 years and older should be immunized. An adult younger than age 65 years who has certain medical conditions should be immunized. Any person who resides in a nursing home or long-term care facility should be immunized. An adult smoker should be immunized. People with an immunocompromised condition and certain other conditions should receive both PCV13 and PPSV23 vaccines. People with human immunodeficiency virus (HIV) infection should be immunized as soon as possible after diagnosis. Immunization during chemotherapy or radiation therapy should be avoided. Routine use of PPSV23 vaccine is not recommended for American Indians, [place] Natives, or people younger than 65 years unless there are medical conditions that require PPSV23 vaccine. When indicated, people who have unknown immunization and have no record of immunization should receive PPSV23 vaccine. One-time revaccination 5 years after the first dose of PPSV23 is recommended for people aged 19-64 years who have chronic kidney failure, nephrotic syndrome, asplenia, or immunocompromised conditions. People who received 1-2 doses of PPSV23 before age 65 years should receive another dose of PPSV23 vaccine at age 65 years or later if at least 5 years have passed since the previous dose. Doses of PPSV23 are not  needed for people immunized with PPSV23 at or after age 65 years.  Meningococcal vaccine. Adults with asplenia or persistent complement component deficiencies should receive 2 doses of quadrivalent meningococcal conjugate (MenACWY-D) vaccine. The doses should be obtained   at least 2 months apart. Microbiologists working with certain meningococcal bacteria, [place] recruits, people at risk during an outbreak, and people who travel to or live in countries with a high rate of meningitis should be immunized. A first-year college student up through age 34 years who is living in a residence hall should receive a dose if she did not receive a dose on or after her 16th birthday. Adults who have certain high-risk conditions should receive one or more doses of vaccine.  Hepatitis A vaccine. Adults who wish to be protected from this disease, have certain high-risk conditions, work with hepatitis A-infected animals, work in hepatitis A research labs, or travel to or work in countries with a high rate of hepatitis A should be immunized. Adults who were previously unvaccinated and who anticipate close contact with an international adoptee during the first 60 days after arrival in the [place] from a country with a high rate of hepatitis A should be immunized.  Hepatitis B vaccine. Adults who wish to be protected from this disease, have certain high-risk conditions, may be exposed to blood or other infectious body fluids, are household contacts or sex partners of hepatitis B positive people, are clients or workers in certain care facilities, or travel to or work in countries with a high rate of hepatitis B should be immunized.  Haemophilus influenzae type b (Hib) vaccine. A previously unvaccinated person with asplenia or sickle cell disease or having a scheduled splenectomy should receive 1 dose of Hib vaccine. Regardless of previous immunization, a recipient of a hematopoietic stem cell transplant should receive a  3-dose series 6-12 months after her successful transplant. Hib vaccine is not recommended for adults with HIV infection. Preventive Services / Frequency Ages 35 to 4 years  Blood pressure check.** / Every 3-5 years.  Lipid and cholesterol check.** / Every 5 years beginning at age 60.  Clinical breast exam.** / Every 3 years for women in their 71s and 10s.  BRCA-related cancer risk assessment.** / For women who have family members with a BRCA-related cancer (breast, ovarian, tubal, or peritoneal cancers).  Pap test.** / Every 2 years from ages 76 through 26. Every 3 years starting at age 61 through age 76 or 93 with a history of 3 consecutive normal Pap tests.  HPV screening.** / Every 3 years from ages 37 through ages 60 to 51 with a history of 3 consecutive normal Pap tests.  Hepatitis C blood test.** / For any individual with known risks for hepatitis C.  Skin self-exam. / Monthly.  Influenza vaccine. / Every year.  Tetanus, diphtheria, and acellular pertussis (Tdap, Td) vaccine.** / Consult your health care provider. Pregnant women should receive 1 dose of Tdap vaccine during each pregnancy. 1 dose of Td every 10 years.  Varicella vaccine.** / Consult your health care provider. Pregnant females who do not have evidence of immunity should receive the first dose after pregnancy.  HPV vaccine. / 3 doses over 6 months, if 93 and younger. The vaccine is not recommended for use in pregnant females. However, pregnancy testing is not needed before receiving a dose.  Measles, mumps, rubella (MMR) vaccine.** / You need at least 1 dose of MMR if you were born in 1957 or later. You may also need a 2nd dose. For females of childbearing age, rubella immunity should be determined. If there is no evidence of immunity, females who are not pregnant should be vaccinated. If there is no evidence of immunity, females who are  pregnant should delay immunization until after pregnancy.  Pneumococcal  13-valent conjugate (PCV13) vaccine.** / Consult your health care provider.  Pneumococcal polysaccharide (PPSV23) vaccine.** / 1 to 2 doses if you smoke cigarettes or if you have certain conditions.  Meningococcal vaccine.** / 1 dose if you are age 68 to 8 years and a Market researcher living in a residence hall, or have one of several medical conditions, you need to get vaccinated against meningococcal disease. You may also need additional booster doses.  Hepatitis A vaccine.** / Consult your health care provider.  Hepatitis B vaccine.** / Consult your health care provider.  Haemophilus influenzae type b (Hib) vaccine.** / Consult your health care provider. Ages 7 to 53 years  Blood pressure check.** / Every year.  Lipid and cholesterol check.** / Every 5 years beginning at age 25 years.  Lung cancer screening. / Every year if you are aged 11-80 years and have a 30-pack-year history of smoking and currently smoke or have quit within the past 15 years. Yearly screening is stopped once you have quit smoking for at least 15 years or develop a health problem that would prevent you from having lung cancer treatment.  Clinical breast exam.** / Every year after age 48 years.  BRCA-related cancer risk assessment.** / For women who have family members with a BRCA-related cancer (breast, ovarian, tubal, or peritoneal cancers).  Mammogram.** / Every year beginning at age 41 years and continuing for as long as you are in good health. Consult with your health care provider.  Pap test.** / Every 3 years starting at age 65 years through age 37 or 70 years with a history of 3 consecutive normal Pap tests.  HPV screening.** / Every 3 years from ages 72 years through ages 60 to 40 years with a history of 3 consecutive normal Pap tests.  Fecal occult blood test (FOBT) of stool. / Every year beginning at age 21 years and continuing until age 5 years. You may not need to do this test if you get  a colonoscopy every 10 years.  Flexible sigmoidoscopy or colonoscopy.** / Every 5 years for a flexible sigmoidoscopy or every 10 years for a colonoscopy beginning at age 35 years and continuing until age 48 years.  Hepatitis C blood test.** / For all people born from [birth year] through 1965 and any individual with known risks for hepatitis C.  Skin self-exam. / Monthly.  Influenza vaccine. / Every year.  Tetanus, diphtheria, and acellular pertussis (Tdap/Td) vaccine.** / Consult your health care provider. Pregnant women should receive 1 dose of Tdap vaccine during each pregnancy. 1 dose of Td every 10 years.  Varicella vaccine.** / Consult your health care provider. Pregnant females who do not have evidence of immunity should receive the first dose after pregnancy.  Zoster vaccine.** / 1 dose for adults aged 30 years or older.  Measles, mumps, rubella (MMR) vaccine.** / You need at least 1 dose of MMR if you were born in 1957 or later. You may also need a second dose. For females of childbearing age, rubella immunity should be determined. If there is no evidence of immunity, females who are not pregnant should be vaccinated. If there is no evidence of immunity, females who are pregnant should delay immunization until after pregnancy.  Pneumococcal 13-valent conjugate (PCV13) vaccine.** / Consult your health care provider.  Pneumococcal polysaccharide (PPSV23) vaccine.** / 1 to 2 doses if you smoke cigarettes or if you have certain conditions.  Meningococcal vaccine.** /  Consult your health care provider.  Hepatitis A vaccine.** / Consult your health care provider.  Hepatitis B vaccine.** / Consult your health care provider.  Haemophilus influenzae type b (Hib) vaccine.** / Consult your health care provider. Ages 64 years and over  Blood pressure check.** / Every year.  Lipid and cholesterol check.** / Every 5 years beginning at age 23 years.  Lung cancer screening. / Every year if you  are aged 16-80 years and have a 30-pack-year history of smoking and currently smoke or have quit within the past 15 years. Yearly screening is stopped once you have quit smoking for at least 15 years or develop a health problem that would prevent you from having lung cancer treatment.  Clinical breast exam.** / Every year after age 74 years.  BRCA-related cancer risk assessment.** / For women who have family members with a BRCA-related cancer (breast, ovarian, tubal, or peritoneal cancers).  Mammogram.** / Every year beginning at age 44 years and continuing for as long as you are in good health. Consult with your health care provider.  Pap test.** / Every 3 years starting at age 58 years through age 22 or 39 years with 3 consecutive normal Pap tests. Testing can be stopped between 65 and 70 years with 3 consecutive normal Pap tests and no abnormal Pap or HPV tests in the past 10 years.  HPV screening.** / Every 3 years from ages 64 years through ages 70 or 61 years with a history of 3 consecutive normal Pap tests. Testing can be stopped between 65 and 70 years with 3 consecutive normal Pap tests and no abnormal Pap or HPV tests in the past 10 years.  Fecal occult blood test (FOBT) of stool. / Every year beginning at age 40 years and continuing until age 27 years. You may not need to do this test if you get a colonoscopy every 10 years.  Flexible sigmoidoscopy or colonoscopy.** / Every 5 years for a flexible sigmoidoscopy or every 10 years for a colonoscopy beginning at age 7 years and continuing until age 32 years.  Hepatitis C blood test.** / For all people born from [birth year] through 1964 and any individual with known risks for hepatitis C.  Osteoporosis screening.** / A one-time screening for women ages 30 years and over and women at risk for fractures or osteoporosis.  Skin self-exam. / Monthly.  Influenza vaccine. / Every year.  Tetanus, diphtheria, and acellular pertussis (Tdap/Td)  vaccine.** / 1 dose of Td every 10 years.  Varicella vaccine.** / Consult your health care provider.  Zoster vaccine.** / 1 dose for adults aged 35 years or older.  Pneumococcal 13-valent conjugate (PCV13) vaccine.** / Consult your health care provider.  Pneumococcal polysaccharide (PPSV23) vaccine.** / 1 dose for all adults aged 46 years and older.  Meningococcal vaccine.** / Consult your health care provider.  Hepatitis A vaccine.** / Consult your health care provider.  Hepatitis B vaccine.** / Consult your health care provider.  Haemophilus influenzae type b (Hib) vaccine.** / Consult your health care provider. ** Family history and personal history of risk and conditions may change your health care provider's recommendations.   This information is not intended to replace advice given to you by your health care provider. Make sure you discuss any questions you have with your health care provider.   Document Released: 12/29/2001 Document Revised: 11/23/2014 Document Reviewed: 03/30/2011 Elsevier Interactive Patient Education [employer].

## 2016-04-21 NOTE — Progress Notes (Signed)
Pre visit review using our clinic review tool, if applicable. No additional management support is needed unless otherwise documented below in the visit note. 

## 2016-04-21 NOTE — Progress Notes (Signed)
Subjective:   Wendy Velazquez is a 75 y.o. female who presents for Medicare Annual (Subsequent) preventive examination.  Review of Systems:   Review of Systems  Constitutional: Negative for activity change, appetite change and fatigue.  HENT: Negative for hearing loss, congestion, tinnitus and ear discharge.   Eyes: Negative for visual disturbance (see optho q1y -- vision corrected to 20/20 with glasses).  Respiratory: Negative for cough, chest tightness and shortness of breath.   Cardiovascular: Negative for chest pain, palpitations and leg swelling.  Gastrointestinal: Negative for abdominal pain, diarrhea, constipation and abdominal distention.  Genitourinary: Negative for urgency, frequency, decreased urine volume and difficulty urinating.  Musculoskeletal: Negative for back pain, arthralgias and gait problem.  Skin: Negative for color change, pallor and rash.  Neurological: Negative for dizziness, light-headedness, numbness and headaches.  Hematological: Negative for adenopathy. Does not bruise/bleed easily.  Psychiatric/Behavioral: Negative for suicidal ideas, confusion, sleep disturbance, self-injury, dysphoric mood, decreased concentration and agitation.  Pt is able to read and write and can do all ADLs No risk for falling No abuse/ violence in home           Objective:     Vitals: BP 136/82 mmHg  Pulse 61  Temp(Src) 98 F (36.7 C) (Oral)  Ht 5\' 2"  (1.575 m)  Wt 184 lb 3.2 oz (83.553 kg)  BMI 33.68 kg/m2  SpO2 98%  Body mass index is 33.68 kg/(m^2). BP 136/82 mmHg  Pulse 61  Temp(Src) 98 F (36.7 C) (Oral)  Ht 5\' 2"  (1.575 m)  Wt 184 lb 3.2 oz (83.553 kg)  BMI 33.68 kg/m2  SpO2 98% General appearance: alert, cooperative, appears stated age and no distress Head: Normocephalic, without obvious abnormality, atraumatic Eyes: conjunctivae/corneas clear. PERRL, EOM's intact. Fundi benign. Ears: normal TM's and external ear canals both ears--+ hearing  aids Nose: Nares normal. Septum midline. Mucosa normal. No drainage or sinus tenderness. Throat: lips, mucosa, and tongue normal; teeth and gums normal Neck: no adenopathy, no carotid bruit, no JVD, supple, symmetrical, trachea midline and thyroid not enlarged, symmetric, no tenderness/mass/nodules Back: symmetric, no curvature. ROM normal. No CVA tenderness. Lungs: clear to auscultation bilaterally Breasts: normal appearance, no masses or tenderness Heart: regular rate and rhythm, S1, S2 normal, no murmur, click, rub or gallop Abdomen: soft, non-tender; bowel sounds normal; no masses,  no organomegaly Pelvic: not indicated; status post hysterectomy, negative ROS Extremities: extremities normal, atraumatic, no cyanosis or edema Pulses: 2+ and symmetric Skin: Skin color, texture, turgor normal. No rashes or lesions Lymph nodes: Cervical, supraclavicular, and axillary nodes normal. Neurologic: Alert and oriented X 3, normal strength and tone. Normal symmetric reflexes. Normal coordination and gait Psych- no depression, no anxiety  Tobacco History  Smoking status  . Former Smoker -- 0.30 packs/day for 15 years  . Quit date: 03/15/1981  Smokeless tobacco  . Never Used     Counseling given: Not Answered   Past Medical History  Diagnosis Date  . Hypertension   . Arthritis   . MRSA (methicillin resistant staph aureus) culture positive   . Rapid heart beat     benigh  . Osteopenia   . Transitional cell carcinoma ([place])   . Melanoma [facility])    Past Surgical History  Procedure Laterality Date  . Tonsillectomy    . Abdominal hysterectomy  1987    partial  . Melanoma excision  1977    left leg  . Melanoma excision  06-24-09    r foot  . Cataract extraction  2004  . Skin cancer excision  1999    bladder transitional cell  . Eye surgery      cataract b/l  . Teeth implants     Family History  Problem Relation Age of Onset  . Diabetes Mother   . Alzheimer's disease Mother   .  Dementia Mother   . Stroke Father   . Hypertension Father    History  Sexual Activity  . Sexual Activity:  . Partners: Male    Outpatient Encounter Prescriptions as of 04/21/2016  Medication Sig  . acetaminophen (TYLENOL ARTHRITIS PAIN) 650 MG CR tablet Take 650 mg by mouth at bedtime.    Marland Kitchen amLODipine (NORVASC) 5 MG tablet Take 1 tablet (5 mg total) by mouth daily.  . Digestive Enzymes (ENZYMATIC DIGESTANT PO) Take by mouth. Enzymatic Therapy probiotic pearls  . estropipate (OGEN) 0.75 MG tablet Take 1 tablet (0.75 mg total) by mouth daily.  . fluticasone (FLONASE) 50 MCG/ACT nasal spray Place 2 sprays into both nostrils daily.  Donnie Aho (GLUCOSAMINE MSM COMPLEX PO) Take 1 tablet by mouth daily.  . GuaiFENesin (MUCINEX PO) Take 200 mg by mouth at bedtime.  Marland Kitchen HYDROcodone-homatropine (HYCODAN) 5-1.5 MG/5ML syrup Take 5 mLs by mouth every 8 (eight) hours as needed for cough.  . medroxyPROGESTERone (PROVERA) 5 MG tablet 1/2 tab po qd 10 days a month as directed.  Brand name necessary.  . meloxicam (MOBIC) 15 MG tablet TAKE 1/2 TO 1 TABLET ONCE  DAILY  . propranolol (INDERAL) 10 MG tablet Take 1 tablet (10 mg total) by mouth 2 (two) times daily.  . ranitidine (ZANTAC 75) 75 MG tablet Take 75 mg by mouth daily.    Marland Kitchen spironolactone (ALDACTONE) 25 MG tablet 1/2 tab po qd  . SYNTHROID 75 MCG tablet TAKE 1 TABLET DAILY  . [DISCONTINUED] amLODipine (NORVASC) 5 MG tablet TAKE 1 TABLET DAILY  . [DISCONTINUED] estropipate (OGEN) 0.75 MG tablet Take 1 tablet (0.75 mg total) by mouth daily.  . [DISCONTINUED] medroxyPROGESTERone (PROVERA) 5 MG tablet 1/2 tab po qd 10 days a month as directed.  Brand name necessary.  . [DISCONTINUED] meloxicam (MOBIC) 15 MG tablet TAKE 1/2 TO 1 TABLET ONCE  DAILY  . [DISCONTINUED] propranolol (INDERAL) 10 MG tablet TAKE 1 TABLET TWICE DAILY  . [DISCONTINUED] spironolactone (ALDACTONE) 25 MG tablet 1/2 tab po qd  . [DISCONTINUED] cefdinir (OMNICEF)  300 MG capsule Take 1 capsule (300 mg total) by mouth 2 (two) times daily.   No facility-administered encounter medications on file as of 04/21/2016.    Activities of Daily Living In your present state of health, do you have any difficulty performing the following activities: 04/21/2016  Hearing? N  Vision? N  Difficulty concentrating or making decisions? N  Walking or climbing stairs? N  Dressing or bathing? N  Doing errands, shopping? N    Patient Care Team: Ann Held, DO as PCP - General Luberta Mutter, MD as Consulting Physician (Ophthalmology) Myrlene Broker, MD as Attending Physician (Urology) Rolm Bookbinder, MD as Consulting Physician (Dermatology)    Assessment:    cpe Exercise Activities and Dietary recommendations--dancing , walking steps    Goals    None     Fall Risk Fall Risk  04/21/2016 08/16/2015 04/18/2015 04/18/2015 04/03/2014  Falls in the past year? No No No No No   Depression Screen PHQ 2/9 Scores 04/21/2016 08/16/2015 04/18/2015 04/18/2015  PHQ - 2 Score 0 0 0 0  Exception Documentation - - -  Patient refusal     Cognitive Testing MMSE - Mini Mental State Exam 04/21/2016  Orientation to time 5  Orientation to Place 5  Registration 3  Attention/ Calculation 5  Recall 3  Language- name 2 objects 2  Language- repeat 1  Language- follow 3 step command 3  Language- read & follow direction 1  Write a sentence 1  Copy design 1  Total score 30    Immunization History  Administered Date(s) Administered  . DTaP 06/05/2013  . Influenza Whole 09/12/2008  . Influenza, High Dose Seasonal PF 08/29/2014  . Influenza-Unspecified 11/30/2012, 09/16/2013  . PPD Test 02/11/2016, 02/18/2016  . Pneumococcal Polysaccharide-23 03/02/2008  . Td 11/29/2002  . Tdap 06/05/2013  . Zoster 03/02/2008   Screening Tests Health Maintenance  Topic Date Due  . PNA vac Low Risk Adult (2 of 2 - PCV13) 03/02/2009  . MAMMOGRAM  02/13/2016  . INFLUENZA VACCINE  01/13/2017  (Originally 06/16/2016)  . COLONOSCOPY  06/01/2017  . TETANUS/TDAP  06/06/2023  . DEXA SCAN  Completed  . ZOSTAVAX  Completed      Plan:    see AVS During the course of the visit the patient was educated and counseled about the following appropriate screening and preventive services:   Vaccines to include Pneumoccal, Influenza, Hepatitis B, Td, Zostavax, HCV  Electrocardiogram  Cardiovascular Disease  Colorectal cancer screening  Bone density screening  Diabetes screening  Glaucoma screening  Mammography/PAP  Nutrition counseling   Patient Instructions (the written plan) was given to the patient.  1. Essential hypertension stabel - spironolactone (ALDACTONE) 25 MG tablet; 1/2 tab po qd  Dispense: 135 tablet; Refill: 3 - propranolol (INDERAL) 10 MG tablet; Take 1 tablet (10 mg total) by mouth 2 (two) times daily.  Dispense: 180 tablet; Refill: 3 - amLODipine (NORVASC) 5 MG tablet; Take 1 tablet (5 mg total) by mouth daily.  Dispense: 90 tablet; Refill: 3 - Comprehensive metabolic panel - CBC with Differential/Platelet - Lipid panel - POCT urinalysis dipstick  2. Menopause   - medroxyPROGESTERone (PROVERA) 5 MG tablet; 1/2 tab po qd 10 days a month as directed.  Brand name necessary.  Dispense: 90 tablet; Refill: 3  3. Postmenopausal   - estropipate (OGEN) 0.75 MG tablet; Take 1 tablet (0.75 mg total) by mouth daily.  Dispense: 90 tablet; Refill: 3  4. Primary osteoarthritis involving multiple joints   - meloxicam (MOBIC) 15 MG tablet; TAKE 1/2 TO 1 TABLET ONCE  DAILY  Dispense: 90 tablet; Refill: 1  5. Hypothyroidism, unspecified hypothyroidism type   - TSH  6. Medicare annual wellness visit, subsequent  See above  Ann Held, DO  04/21/2016

## 2016-04-21 NOTE — Addendum Note (Signed)
Addended by: Caffie Pinto on: 04/21/2016 10:45 AM   Modules accepted: Orders

## 2016-04-23 LAB — URINE CULTURE: Colony Count: 40000

## 2016-04-27 ENCOUNTER — Other Ambulatory Visit: Payer: Self-pay

## 2016-04-27 DIAGNOSIS — R7309 Other abnormal glucose: Secondary | ICD-10-CM

## 2016-04-27 DIAGNOSIS — I10 Essential (primary) hypertension: Secondary | ICD-10-CM

## 2016-07-17 DIAGNOSIS — D2272 Melanocytic nevi of left lower limb, including hip: Secondary | ICD-10-CM | POA: Diagnosis not present

## 2016-07-17 DIAGNOSIS — Z85820 Personal history of malignant melanoma of skin: Secondary | ICD-10-CM | POA: Diagnosis not present

## 2016-07-17 DIAGNOSIS — L814 Other melanin hyperpigmentation: Secondary | ICD-10-CM | POA: Diagnosis not present

## 2016-07-17 DIAGNOSIS — D1801 Hemangioma of skin and subcutaneous tissue: Secondary | ICD-10-CM | POA: Diagnosis not present

## 2016-07-17 DIAGNOSIS — L821 Other seborrheic keratosis: Secondary | ICD-10-CM | POA: Diagnosis not present

## 2016-07-17 DIAGNOSIS — L84 Corns and callosities: Secondary | ICD-10-CM | POA: Diagnosis not present

## 2016-07-17 DIAGNOSIS — D225 Melanocytic nevi of trunk: Secondary | ICD-10-CM | POA: Diagnosis not present

## 2016-07-17 DIAGNOSIS — I8391 Asymptomatic varicose veins of right lower extremity: Secondary | ICD-10-CM | POA: Diagnosis not present

## 2016-07-31 DIAGNOSIS — C672 Malignant neoplasm of lateral wall of bladder: Secondary | ICD-10-CM | POA: Diagnosis not present

## 2016-09-01 ENCOUNTER — Other Ambulatory Visit: Payer: Self-pay | Admitting: Family

## 2016-10-11 ENCOUNTER — Encounter: Payer: Self-pay | Admitting: Family Medicine

## 2016-11-17 DIAGNOSIS — Z961 Presence of intraocular lens: Secondary | ICD-10-CM | POA: Diagnosis not present

## 2016-12-16 DIAGNOSIS — D225 Melanocytic nevi of trunk: Secondary | ICD-10-CM | POA: Diagnosis not present

## 2016-12-16 DIAGNOSIS — D1801 Hemangioma of skin and subcutaneous tissue: Secondary | ICD-10-CM | POA: Diagnosis not present

## 2016-12-16 DIAGNOSIS — Z85820 Personal history of malignant melanoma of skin: Secondary | ICD-10-CM | POA: Diagnosis not present

## 2016-12-16 DIAGNOSIS — L820 Inflamed seborrheic keratosis: Secondary | ICD-10-CM | POA: Diagnosis not present

## 2016-12-16 DIAGNOSIS — D485 Neoplasm of uncertain behavior of skin: Secondary | ICD-10-CM | POA: Diagnosis not present

## 2016-12-16 DIAGNOSIS — L814 Other melanin hyperpigmentation: Secondary | ICD-10-CM | POA: Diagnosis not present

## 2016-12-16 DIAGNOSIS — L821 Other seborrheic keratosis: Secondary | ICD-10-CM | POA: Diagnosis not present

## 2016-12-16 DIAGNOSIS — L817 Pigmented purpuric dermatosis: Secondary | ICD-10-CM | POA: Diagnosis not present

## 2017-03-26 ENCOUNTER — Other Ambulatory Visit: Payer: Self-pay | Admitting: Family Medicine

## 2017-03-26 MED ORDER — SYNTHROID 75 MCG PO TABS: 75.0000 ug | ORAL_TABLET | Freq: Every day | ORAL | 0 refills | Status: DC

## 2017-04-07 ENCOUNTER — Encounter: Payer: Self-pay | Admitting: Gastroenterology

## 2017-04-13 ENCOUNTER — Encounter: Payer: Self-pay | Admitting: Gastroenterology

## 2017-04-29 ENCOUNTER — Telehealth: Payer: Self-pay | Admitting: *Deleted

## 2017-04-29 NOTE — Telephone Encounter (Signed)
Pt declines having AWV with RN and states she only wishes to see Dr.Lowne

## 2017-05-03 ENCOUNTER — Telehealth: Payer: Self-pay

## 2017-05-03 ENCOUNTER — Telehealth: Payer: Self-pay | Admitting: Family Medicine

## 2017-05-03 NOTE — Telephone Encounter (Signed)
PAtient declined to see [facility] for her AWV. States she only see her provider once a year and she refuse to see the [facility] for this visit. Appt left with provider.

## 2017-05-03 NOTE — Telephone Encounter (Signed)
Pre visit call made to patient. Patient states she was driving to work and was ok to talk, however, patient rushed through questions and stated several times all information should be in her chart. Advised I was calling to update information.

## 2017-05-04 ENCOUNTER — Ambulatory Visit (INDEPENDENT_AMBULATORY_CARE_PROVIDER_SITE_OTHER): Payer: Medicare Other | Admitting: Family Medicine

## 2017-05-04 ENCOUNTER — Encounter: Payer: Self-pay | Admitting: Family Medicine

## 2017-05-04 VITALS — BP 128/78 | HR 66 | Temp 98.0°F | Resp 16 | Ht 62.0 in | Wt 175.8 lb

## 2017-05-04 DIAGNOSIS — Z1231 Encounter for screening mammogram for malignant neoplasm of breast: Secondary | ICD-10-CM | POA: Diagnosis not present

## 2017-05-04 DIAGNOSIS — M159 Polyosteoarthritis, unspecified: Secondary | ICD-10-CM

## 2017-05-04 DIAGNOSIS — Z0000 Encounter for general adult medical examination without abnormal findings: Secondary | ICD-10-CM

## 2017-05-04 DIAGNOSIS — Z780 Asymptomatic menopausal state: Secondary | ICD-10-CM | POA: Diagnosis not present

## 2017-05-04 DIAGNOSIS — I10 Essential (primary) hypertension: Secondary | ICD-10-CM | POA: Diagnosis not present

## 2017-05-04 DIAGNOSIS — E039 Hypothyroidism, unspecified: Secondary | ICD-10-CM | POA: Diagnosis not present

## 2017-05-04 DIAGNOSIS — M150 Primary generalized (osteo)arthritis: Secondary | ICD-10-CM

## 2017-05-04 LAB — CBC WITH DIFFERENTIAL/PLATELET
Basophils Absolute: 0 10*3/uL (ref 0.0–0.1)
Basophils Relative: 0.6 % (ref 0.0–3.0)
EOS ABS: 0.2 10*3/uL (ref 0.0–0.7)
Eosinophils Relative: 2.7 % (ref 0.0–5.0)
HEMATOCRIT: 43.4 % (ref 36.0–46.0)
HEMOGLOBIN: 14.6 g/dL (ref 12.0–15.0)
Lymphocytes Relative: 28.8 % (ref 12.0–46.0)
Lymphs Abs: 2 10*3/uL (ref 0.7–4.0)
MCHC: 33.5 g/dL (ref 30.0–36.0)
MCV: 91.8 fl (ref 78.0–100.0)
MONOS PCT: 6.9 % (ref 3.0–12.0)
Monocytes Absolute: 0.5 10*3/uL (ref 0.1–1.0)
Neutro Abs: 4.2 10*3/uL (ref 1.4–7.7)
Neutrophils Relative %: 61 % (ref 43.0–77.0)
Platelets: 204 10*3/uL (ref 150.0–400.0)
RBC: 4.73 Mil/uL (ref 3.87–5.11)
RDW: 12.9 % (ref 11.5–15.5)
WBC: 6.9 10*3/uL (ref 4.0–10.5)

## 2017-05-04 LAB — COMPREHENSIVE METABOLIC PANEL
ALBUMIN: 4.3 g/dL (ref 3.5–5.2)
ALT: 17 U/L (ref 0–35)
AST: 19 U/L (ref 0–37)
Alkaline Phosphatase: 75 U/L (ref 39–117)
BILIRUBIN TOTAL: 1 mg/dL (ref 0.2–1.2)
BUN: 21 mg/dL (ref 6–23)
CALCIUM: 10 mg/dL (ref 8.4–10.5)
CHLORIDE: 102 meq/L (ref 96–112)
CO2: 31 mEq/L (ref 19–32)
CREATININE: 0.97 mg/dL (ref 0.40–1.20)
GFR: 59.52 mL/min — ABNORMAL LOW (ref >60.00)
Glucose, Bld: 105 mg/dL — ABNORMAL HIGH (ref 70–99)
Potassium: 3.9 mEq/L (ref 3.5–5.1)
Sodium: 141 mEq/L (ref 135–145)
Total Protein: 6.6 g/dL (ref 6.0–8.3)

## 2017-05-04 LAB — LIPID PANEL
CHOLESTEROL: 160 mg/dL (ref 0–200)
HDL: 40.8 mg/dL (ref >39.00)
LDL CALC: 86 mg/dL (ref 0–99)
NonHDL: 119.68
TRIGLYCERIDES: 166 mg/dL — AB (ref 0.0–149.0)
Total CHOL/HDL Ratio: 4
VLDL: 33.2 mg/dL (ref 0.0–40.0)

## 2017-05-04 LAB — TSH: TSH: 1.29 u[IU]/mL (ref 0.35–4.50)

## 2017-05-04 MED ORDER — SYNTHROID 75 MCG PO TABS: 75.0000 ug | ORAL_TABLET | Freq: Every day | ORAL | 3 refills | Status: DC

## 2017-05-04 MED ORDER — MEDROXYPROGESTERONE ACETATE 5 MG PO TABS: ORAL_TABLET | ORAL | 3 refills | Status: DC

## 2017-05-04 MED ORDER — AMLODIPINE BESYLATE 5 MG PO TABS: 5.0000 mg | ORAL_TABLET | Freq: Every day | ORAL | 3 refills | Status: DC

## 2017-05-04 MED ORDER — MELOXICAM 15 MG PO TABS: ORAL_TABLET | ORAL | 1 refills | Status: DC

## 2017-05-04 MED ORDER — PROPRANOLOL HCL 10 MG PO TABS: 10.0000 mg | ORAL_TABLET | Freq: Two times a day (BID) | ORAL | 3 refills | Status: DC

## 2017-05-04 MED ORDER — ESTROPIPATE 0.75 MG PO TABS: 0.7500 mg | ORAL_TABLET | Freq: Every day | ORAL | 3 refills | Status: DC

## 2017-05-04 MED ORDER — SPIRONOLACTONE 25 MG PO TABS: ORAL_TABLET | ORAL | 3 refills | Status: DC

## 2017-05-04 NOTE — Progress Notes (Signed)
Subjective:   Wendy Velazquez is a 76 y.o. female who presents for Medicare Annual (Subsequent) preventive examination.  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 She really enjoys working and feels like it helps her stay sharp.  Review of Systems:  No ROS.  Medicare Wellness Visit. Additional risk factors are reflected in the social history.  Cardiac Risk Factors include: advanced age (>49men, >12 women);hypertension;obesity (BMI >30kg/m2)  Sleep patterns: no sleep issues and gets up 0-3 times nightly to void.   Home Safety/Smoke Alarms: Feels safe in home. Smoke alarms in place. Security system in place. Living environment; residence and Firearm Safety: Lives alone in [place]. 1-story house/ trailer, walk-in shower, no firearms. Seat Belt Safety/Bike Helmet: Wears seat belt.   Counseling:   Eye Exam- Follows w/ Dr. Ellie Lunch. Dental- Follows w/ Dr. Evelene Croon twice yearly.  Female:   Pap- Aged out.       Mammo- last 2015. BI-RADS CATEGORY  1: Negative.        Dexa scan- last 03/26/11. Normal.       CCS- last 06/02/07. Diverticulosis. 10 year recall. She does not wish to have another colonoscopy but is agreeable to Cologuard with the understanding that colonoscopy will be necessary if + Cologuard.       Objective:     Vitals: BP 128/78 (BP Location: Left Arm, Cuff Size: Normal)   Pulse 66   Temp 98 F (36.7 C) (Oral)   Resp 16   Ht 5\' 2"  (1.575 m)   Wt 175 lb 12.8 oz (79.7 kg)   SpO2 93%   BMI 32.15 kg/m   Body mass index is 32.15 kg/m.   Tobacco History  Smoking Status  . Former Smoker  . Packs/day: 0.30  . Years: 15.00  . Quit date: 03/15/1981  Smokeless Tobacco  . Never Used     Counseling given: Not Answered   Past Medical History:  Diagnosis Date  . Arthritis   . Hypertension   . Melanoma ([place])   . MRSA (methicillin resistant staph aureus) culture positive   . Osteopenia   . Rapid heart beat    benigh  . Transitional  cell carcinoma [facility])    Past Surgical History:  Procedure Laterality Date  . ABDOMINAL HYSTERECTOMY  1987   partial  . CATARACT EXTRACTION  2004  . EYE SURGERY     cataract b/l  . MELANOMA EXCISION  1977   left leg  . MELANOMA EXCISION  06-24-09   r foot  . SKIN CANCER EXCISION  1999   bladder transitional cell  . teeth implants    . TONSILLECTOMY     Family History  Problem Relation Age of Onset  . Diabetes Mother   . Alzheimer's disease Mother   . Dementia Mother   . Stroke Father   . Hypertension Father    History  Sexual Activity  . Sexual activity: Not Currently  . Partners: Male    Outpatient Encounter Prescriptions as of 05/04/2017  Medication Sig  . acetaminophen (TYLENOL ARTHRITIS PAIN) 650 MG CR tablet Take 650 mg by mouth at bedtime.    . Alpha-D-Galactosidase (BEANO PO) Take 1 tablet by mouth as needed.  Marland Kitchen amLODipine (NORVASC) 5 MG tablet Take 1 tablet (5 mg total) by mouth daily.  . Digestive Enzymes (ENZYMATIC DIGESTANT PO) Take by mouth. Enzymatic Therapy probiotic pearls  . estropipate (OGEN) 0.75 MG tablet Take 1 tablet (0.75 mg total) by mouth daily.  Marland Kitchen  fluticasone (FLONASE) 50 MCG/ACT nasal spray Place 2 sprays into both nostrils daily.  Donnie Aho (GLUCOSAMINE MSM COMPLEX PO) Take 1 tablet by mouth daily.  . GuaiFENesin (MUCINEX PO) Take 200 mg by mouth at bedtime.  Marland Kitchen HYDROcodone-homatropine (HYCODAN) 5-1.5 MG/5ML syrup Take 5 mLs by mouth every 8 (eight) hours as needed for cough.  . medroxyPROGESTERone (PROVERA) 5 MG tablet 1/2 tab po qd 10 days a month as directed.  Brand name necessary.  . meloxicam (MOBIC) 15 MG tablet TAKE 1/2 TO 1 TABLET ONCE  DAILY  . propranolol (INDERAL) 10 MG tablet Take 1 tablet (10 mg total) by mouth 2 (two) times daily.  . ranitidine (ZANTAC 75) 75 MG tablet Take 75 mg by mouth daily.    Marland Kitchen spironolactone (ALDACTONE) 25 MG tablet 1/2 tab po qd  . SYNTHROID 75 MCG tablet Take 1 tablet (75 mcg total) by  mouth daily.  . [DISCONTINUED] amLODipine (NORVASC) 5 MG tablet Take 1 tablet (5 mg total) by mouth daily.  . [DISCONTINUED] estropipate (OGEN) 0.75 MG tablet Take 1 tablet (0.75 mg total) by mouth daily.  . [DISCONTINUED] medroxyPROGESTERone (PROVERA) 5 MG tablet 1/2 tab po qd 10 days a month as directed.  Brand name necessary.  . [DISCONTINUED] meloxicam (MOBIC) 15 MG tablet TAKE 1/2 TO 1 TABLET ONCE  DAILY  . [DISCONTINUED] propranolol (INDERAL) 10 MG tablet Take 1 tablet (10 mg total) by mouth 2 (two) times daily.  . [DISCONTINUED] spironolactone (ALDACTONE) 25 MG tablet 1/2 tab po qd  . [DISCONTINUED] SYNTHROID 75 MCG tablet Take 1 tablet (75 mcg total) by mouth daily.   No facility-administered encounter medications on file as of 05/04/2017.     Activities of Daily Living In your present state of health, do you have any difficulty performing the following activities: 05/04/2017  Hearing? N  Vision? N  Difficulty concentrating or making decisions? N  Walking or climbing stairs? N  Dressing or bathing? N  Doing errands, shopping? N  Preparing Food and eating ? N  Using the Toilet? N  In the past six months, have you accidently leaked urine? N  Do you have problems with loss of bowel control? N  Managing your Medications? N  Managing your Finances? N  Housekeeping or managing your Housekeeping? N  Some recent data might be hidden    Patient Care Team: Carollee Herter, Alferd Apa, DO as PCP - General Luberta Mutter, MD as Consulting Physician (Ophthalmology) Myrlene Broker, MD as Attending Physician (Urology) Rolm Bookbinder, MD as Consulting Physician (Dermatology)    Assessment:    Physical assessment deferred to PCP.  Exercise Activities and Dietary recommendations Current Exercise Habits: Home exercise routine, Type of exercise: stretching;Other - see comments (ballroom dancing), Frequency (Times/Week): 7  Diet (meal preparation, eat out, water intake, caffeinated  beverages, dairy products, fruits and vegetables): in general, a "healthy" diet  , well balanced, on average, 3 meals per day. Regular diet. Prepares most meals at home and eats out about 3x weekly. Drinks 1/2 sweet 1/2 unsweet tea, hot tea w/ honey, and water.      Goals    . Maintain current health status.      Fall Risk Fall Risk  05/04/2017 04/21/2016 08/16/2015 04/18/2015 04/18/2015  Falls in the past year? No No No No No   Depression Screen PHQ 2/9 Scores 05/04/2017 04/21/2016 08/16/2015 04/18/2015  PHQ - 2 Score 0 0 0 0  Exception Documentation - - - -  Cognitive Function MMSE - Mini Mental State Exam 05/04/2017 04/21/2016  Orientation to time 5 5  Orientation to Place 5 5  Registration 3 3  Attention/ Calculation 5 5  Recall 3 3  Language- name 2 objects 2 2  Language- repeat 1 1  Language- follow 3 step command 3 3  Language- read & follow direction 1 1  Write a sentence 1 1  Copy design 1 1  Total score 30 30        Immunization History  Administered Date(s) Administered  . DTaP 06/05/2013  . Influenza Whole 09/12/2008  . Influenza, High Dose Seasonal PF 08/29/2014  . Influenza-Unspecified 11/30/2012, 09/16/2013, 08/17/2016  . PPD Test 02/11/2016, 02/18/2016  . Pneumococcal Conjugate-13 04/21/2016  . Pneumococcal Polysaccharide-23 03/02/2008  . Td 11/29/2002  . Tdap 06/05/2013  . Zoster 03/02/2008   Screening Tests Health Maintenance  Topic Date Due  . MAMMOGRAM  02/13/2015  . COLONOSCOPY  06/01/2017  . INFLUENZA VACCINE  06/16/2017  . TETANUS/TDAP  06/06/2023  . DEXA SCAN  Completed  . PNA vac Low Risk Adult  Completed      Plan:    Follow-up w/ PCP as directed.   Bring a copy of your advance directives to your next office visit.  Cologuard ordered via portal. Order #[id number].   Orders placed for MMG and DEXA.  I have personally reviewed and noted the following in the patient's chart:   . Medical and social history . Use of alcohol, tobacco or  illicit drugs  . Current medications and supplements . Functional ability and status . Nutritional status . Physical activity . Advanced directives . List of other physicians . Vitals . Screenings to include cognitive, depression, and falls . Referrals and appointments  In addition, I have reviewed and discussed with patient certain preventive protocols, quality metrics, and best practice recommendations. A written personalized care plan for preventive services as well as general preventive health recommendations were provided to patient.     Dorrene German, RN  05/04/2017

## 2017-05-04 NOTE — Assessment & Plan Note (Signed)
Well controlled, no changes to meds. Encouraged heart healthy diet such as the DASH diet and exercise as tolerated.  °

## 2017-05-04 NOTE — Patient Instructions (Addendum)
Do not forget to schedule your Mammogram.  [facility] number is [phone].   Wendy Velazquez , Thank you for taking time to come for your Medicare Wellness Visit. I appreciate your ongoing commitment to your health goals. Please review the following plan we discussed and let me know if I can assist you in the future.   Bring a copy of your advance directives to your next office visit.  These are the goals we discussed: Goals    . Maintain current health status.       This is a list of the screening recommended for you and due dates:  Health Maintenance  Topic Date Due  . Mammogram  02/13/2015  . Colon Cancer Screening  06/01/2017  . Flu Shot  06/16/2017  . Tetanus Vaccine  06/06/2023  . DEXA scan (bone density measurement)  Completed  . Pneumonia vaccines  Completed      Preventive Care 91 Years and Older, Female Preventive care refers to lifestyle choices and visits with your health care provider that can promote health and wellness. What does preventive care include?  A yearly physical exam. This is also called an annual well check.  Dental exams once or twice a year.  Routine eye exams. Ask your health care provider how often you should have your eyes checked.  Personal lifestyle choices, including: ? Daily care of your teeth and gums. ? Regular physical activity. ? Eating a healthy diet. ? Avoiding tobacco and drug use. ? Limiting alcohol use. ? Practicing safe sex. ? Taking low-dose aspirin every day. ? Taking vitamin and mineral supplements as recommended by your health care provider. What happens during an annual well check? The services and screenings done by your health care provider during your annual well check will depend on your age, overall health, lifestyle risk factors, and family history of disease. Counseling Your health care provider may ask you questions about your:  Alcohol use.  Tobacco use.  Drug use.  Emotional well-being.  Home  and relationship well-being.  Sexual activity.  Eating habits.  History of falls.  Memory and ability to understand (cognition).  Work and work Statistician.  Reproductive health.  Screening You may have the following tests or measurements:  Height, weight, and BMI.  Blood pressure.  Lipid and cholesterol levels. These may be checked every 5 years, or more frequently if you are over 77 years old.  Skin check.  Lung cancer screening. You may have this screening every year starting at age 31 if you have a 30-pack-year history of smoking and currently smoke or have quit within the past 15 years.  Fecal occult blood test (FOBT) of the stool. You may have this test every year starting at age 45.  Flexible sigmoidoscopy or colonoscopy. You may have a sigmoidoscopy every 5 years or a colonoscopy every 10 years starting at age 53.  Hepatitis C blood test.  Hepatitis B blood test.  Sexually transmitted disease (STD) testing.  Diabetes screening. This is done by checking your blood sugar (glucose) after you have not eaten for a while (fasting). You may have this done every 1-3 years.  Bone density scan. This is done to screen for osteoporosis. You may have this done starting at age 27.  Mammogram. This may be done every 1-2 years. Talk to your health care provider about how often you should have regular mammograms.  Talk with your health care provider about your test results, treatment options, and if necessary, the need for  more tests. Vaccines Your health care provider may recommend certain vaccines, such as:  Influenza vaccine. This is recommended every year.  Tetanus, diphtheria, and acellular pertussis (Tdap, Td) vaccine. You may need a Td booster every 10 years.  Varicella vaccine. You may need this if you have not been vaccinated.  Zoster vaccine. You may need this after age 31.  Measles, mumps, and rubella (MMR) vaccine. You may need at least one dose of MMR if you  were born in 1957 or later. You may also need a second dose.  Pneumococcal 13-valent conjugate (PCV13) vaccine. One dose is recommended after age 23.  Pneumococcal polysaccharide (PPSV23) vaccine. One dose is recommended after age 64.  Meningococcal vaccine. You may need this if you have certain conditions.  Hepatitis A vaccine. You may need this if you have certain conditions or if you travel or work in places where you may be exposed to hepatitis A.  Hepatitis B vaccine. You may need this if you have certain conditions or if you travel or work in places where you may be exposed to hepatitis B.  Haemophilus influenzae type b (Hib) vaccine. You may need this if you have certain conditions.  Talk to your health care provider about which screenings and vaccines you need and how often you need them. This information is not intended to replace advice given to you by your health care provider. Make sure you discuss any questions you have with your health care provider. Document Released: 11/29/2015 Document Revised: 07/22/2016 Document Reviewed: 09/03/2015 Elsevier Interactive Patient Education  2017 [employer].

## 2017-05-04 NOTE — Assessment & Plan Note (Signed)
Check labs con't synthroid 

## 2017-05-04 NOTE — Progress Notes (Signed)
Patient ID: [name], female   DOB: May 01, 1941, 76 y.o.   MRN: [id number]    Subjective:  I acted as a Education administrator for Dr. Carollee Herter.  Guerry Bruin, [place]   Patient ID: [name], female    DOB: May 09, 1941, 76 y.o.   MRN: [id number]  Chief Complaint  Patient presents with  . Hypertension  . Hypothyroidism  . Varicose Veins  . Medicare Wellness    w/ RN    HPI  Patient is in today for follow up blood pressure, thyroid, and would like to talk about varicose veins in her legs.  Patient Care Team: Carollee Herter, Alferd Apa, DO as PCP - General Luberta Mutter, MD as Consulting Physician (Ophthalmology) Myrlene Broker, MD as Attending Physician (Urology) Rolm Bookbinder, MD as Consulting Physician (Dermatology)   Past Medical History:  Diagnosis Date  . Arthritis   . Hypertension   . Melanoma ([place])   . MRSA (methicillin resistant staph aureus) culture positive   . Osteopenia   . Rapid heart beat    benigh  . Transitional cell carcinoma [facility])     Past Surgical History:  Procedure Laterality Date  . ABDOMINAL HYSTERECTOMY  1987   partial  . CATARACT EXTRACTION  2004  . EYE SURGERY     cataract b/l  . MELANOMA EXCISION  1977   left leg  . MELANOMA EXCISION  06-24-09   r foot  . SKIN CANCER EXCISION  1999   bladder transitional cell  . teeth implants    . TONSILLECTOMY      Family History  Problem Relation Age of Onset  . Diabetes Mother   . Alzheimer's disease Mother   . Dementia Mother   . Stroke Father   . Hypertension Father     Social History   Social History  . Marital status: Widowed    Spouse name: N/A  . Number of children: N/A  . Years of education: N/A   Occupational History  . 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 Smoking status: Former Smoker    Packs/day: 0.30    Years: 15.00    Quit date: 03/15/1981  . Smokeless tobacco: Never Used  . Alcohol use 0.6 - 1.2 oz/week    1 - 2 Glasses of  wine per week  . Drug use: No  . Sexual activity: Not Currently    Partners: Male   Other Topics Concern  . Not on file   Social History Narrative   Exercise--- dancing    Outpatient Medications Prior to Visit  Medication Sig Dispense Refill  . acetaminophen (TYLENOL ARTHRITIS PAIN) 650 MG CR tablet Take 650 mg by mouth at bedtime.      . Digestive Enzymes (ENZYMATIC DIGESTANT PO) Take by mouth. Enzymatic Therapy probiotic pearls    . fluticasone (FLONASE) 50 MCG/ACT nasal spray Place 2 sprays into both nostrils daily. 16 g 1  . Glucos-MSM-C-Mn-Ginger-Willow (GLUCOSAMINE MSM COMPLEX PO) Take 1 tablet by mouth daily.    . GuaiFENesin (MUCINEX PO) Take 200 mg by mouth at bedtime.    Marland Kitchen HYDROcodone-homatropine (HYCODAN) 5-1.5 MG/5ML syrup Take 5 mLs by mouth every 8 (eight) hours as needed for cough. 120 mL 0  . ranitidine (ZANTAC 75) 75 MG tablet Take 75 mg by mouth daily.      Marland Kitchen amLODipine (NORVASC) 5 MG tablet Take 1 tablet (5 mg total) by mouth daily. 90 tablet  3  . estropipate (OGEN) 0.75 MG tablet Take 1 tablet (0.75 mg total) by mouth daily. 90 tablet 3  . medroxyPROGESTERone (PROVERA) 5 MG tablet 1/2 tab po qd 10 days a month as directed.  Brand name necessary. 90 tablet 3  . meloxicam (MOBIC) 15 MG tablet TAKE 1/2 TO 1 TABLET ONCE  DAILY 90 tablet 1  . propranolol (INDERAL) 10 MG tablet Take 1 tablet (10 mg total) by mouth 2 (two) times daily. 180 tablet 3  . spironolactone (ALDACTONE) 25 MG tablet 1/2 tab po qd 135 tablet 3  . SYNTHROID 75 MCG tablet Take 1 tablet (75 mcg total) by mouth daily. 90 tablet 0   No facility-administered medications prior to visit.     Allergies  Allergen Reactions  . Antihistamines, Diphenhydramine-Type Tinitus  . Codeine     Nausea  Can take Hydromet    Review of Systems  Constitutional: Negative for fever and malaise/fatigue.  HENT: Negative for congestion.   Eyes: Negative for blurred vision.  Respiratory: Negative for cough and  shortness of breath.   Cardiovascular: Negative for chest pain, palpitations and leg swelling.  Gastrointestinal: Negative for vomiting.  Musculoskeletal: Negative for back pain.  Skin: Negative for rash.       Varicose veins both legs   Neurological: Negative for loss of consciousness and headaches.       Objective:    Physical Exam  Constitutional: She is oriented to person, place, and time. She appears well-developed and well-nourished.  HENT:  Head: Normocephalic and atraumatic.  Eyes: Conjunctivae and EOM are normal.  Neck: Normal range of motion. Neck supple. No JVD present. Carotid bruit is not present. No thyromegaly present.  Cardiovascular: Normal rate, regular rhythm and normal heart sounds.   No murmur heard. Pulmonary/Chest: Effort normal and breath sounds normal. No respiratory distress. She has no wheezes. She has no rales. She exhibits no tenderness.  Musculoskeletal: She exhibits edema.       Right ankle: Normal.       Left ankle: She exhibits swelling.       Feet:  Neurological: She is alert and oriented to person, place, and time.  Skin:  Spider veins L thigh/ leg   Psychiatric: She has a normal mood and affect.  Nursing note and vitals reviewed.   BP 128/78 (BP Location: Left Arm, Cuff Size: Normal)   Pulse 66   Temp 98 F (36.7 C) (Oral)   Resp 16   Ht 5\' 2"  (1.575 m)   Wt 175 lb 12.8 oz (79.7 kg)   SpO2 93%   BMI 32.15 kg/m  Wt Readings from Last 3 Encounters:  05/04/17 175 lb 12.8 oz (79.7 kg)  04/21/16 184 lb 3.2 oz (83.6 kg)  02/10/16 186 lb 6.4 oz (84.6 kg)   BP Readings from Last 3 Encounters:  05/04/17 128/78  04/21/16 136/82  02/10/16 116/74     Immunization History  Administered Date(s) Administered  . DTaP 06/05/2013  . Influenza Whole 09/12/2008  . Influenza, High Dose Seasonal PF 08/29/2014  . Influenza-Unspecified 11/30/2012, 09/16/2013, 08/17/2016  . PPD Test 02/11/2016, 02/18/2016  . Pneumococcal Conjugate-13  04/21/2016  . Pneumococcal Polysaccharide-23 03/02/2008  . Td 11/29/2002  . Tdap 06/05/2013  . Zoster 03/02/2008    Health Maintenance  Topic Date Due  . MAMMOGRAM  02/13/2015  . COLONOSCOPY  06/01/2017  . INFLUENZA VACCINE  06/16/2017  . TETANUS/TDAP  06/06/2023  . DEXA SCAN  Completed  . PNA vac Low Risk  Adult  Completed    Lab Results  Component Value Date   WBC 6.9 05/04/2017   HGB 14.6 05/04/2017   HCT 43.4 05/04/2017   PLT 204.0 05/04/2017   GLUCOSE 105 (H) 05/04/2017   CHOL 160 05/04/2017   TRIG 166.0 (H) 05/04/2017   HDL 40.80 05/04/2017   LDLDIRECT 102.0 04/18/2015   LDLCALC 86 05/04/2017   ALT 17 05/04/2017   AST 19 05/04/2017   NA 141 05/04/2017   K 3.9 05/04/2017   CL 102 05/04/2017   CREATININE 0.97 05/04/2017   BUN 21 05/04/2017   CO2 31 05/04/2017   TSH 1.29 05/04/2017   MICROALBUR 2.0 (H) 04/18/2015    Lab Results  Component Value Date   TSH 1.29 05/04/2017   Lab Results  Component Value Date   WBC 6.9 05/04/2017   HGB 14.6 05/04/2017   HCT 43.4 05/04/2017   MCV 91.8 05/04/2017   PLT 204.0 05/04/2017   Lab Results  Component Value Date   NA 141 05/04/2017   K 3.9 05/04/2017   CO2 31 05/04/2017   GLUCOSE 105 (H) 05/04/2017   BUN 21 05/04/2017   CREATININE 0.97 05/04/2017   BILITOT 1.0 05/04/2017   ALKPHOS 75 05/04/2017   AST 19 05/04/2017   ALT 17 05/04/2017   PROT 6.6 05/04/2017   ALBUMIN 4.3 05/04/2017   CALCIUM 10.0 05/04/2017   GFR 59.52 (L) 05/04/2017   Lab Results  Component Value Date   CHOL 160 05/04/2017   Lab Results  Component Value Date   HDL 40.80 05/04/2017   Lab Results  Component Value Date   LDLCALC 86 05/04/2017   Lab Results  Component Value Date   TRIG 166.0 (H) 05/04/2017   Lab Results  Component Value Date   CHOLHDL 4 05/04/2017   No results found for: HGBA1C       Assessment & Plan:   Problem List Items Addressed This Visit      Unprioritized   Essential hypertension    Well  controlled, no changes to meds. Encouraged heart healthy diet such as the DASH diet and exercise as tolerated.       Relevant Medications   spironolactone (ALDACTONE) 25 MG tablet   amLODipine (NORVASC) 5 MG tablet   propranolol (INDERAL) 10 MG tablet   Other Relevant Orders   CBC with Differential/Platelet (Completed)   Comprehensive metabolic panel (Completed)   Lipid panel (Completed)   POCT Urinalysis Dipstick (Automated)   Hypothyroidism - Primary    Check labs con't synthroid      Relevant Medications   propranolol (INDERAL) 10 MG tablet   SYNTHROID 75 MCG tablet   Other Relevant Orders   TSH (Completed)   POCT Urinalysis Dipstick (Automated)    Other Visit Diagnoses    Postmenopausal       Relevant Medications   estropipate (OGEN) 0.75 MG tablet   Menopause       Relevant Medications   medroxyPROGESTERone (PROVERA) 5 MG tablet   Primary osteoarthritis involving multiple joints       Relevant Medications   meloxicam (MOBIC) 15 MG tablet   Asymptomatic postmenopausal state       Relevant Orders   DG Bone Density   Encounter for screening mammogram for breast cancer       Relevant Orders   MM Digital Screening   Encounter for Medicare annual wellness exam          I am having Ms. Limburg maintain her acetaminophen, ranitidine,  Digestive Enzymes (ENZYMATIC DIGESTANT PO), Glucos-MSM-C-Mn-Ginger-Willow (GLUCOSAMINE MSM COMPLEX PO), fluticasone, HYDROcodone-homatropine, GuaiFENesin (MUCINEX PO), Alpha-D-Galactosidase (BEANO PO), spironolactone, amLODipine, estropipate, medroxyPROGESTERone, meloxicam, propranolol, and SYNTHROID.  Meds ordered this encounter  Medications  . Alpha-D-Galactosidase (BEANO PO)    Sig: Take 1 tablet by mouth as needed.  Marland Kitchen spironolactone (ALDACTONE) 25 MG tablet    Sig: 1/2 tab po qd    Dispense:  135 tablet    Refill:  3  . amLODipine (NORVASC) 5 MG tablet    Sig: Take 1 tablet (5 mg total) by mouth daily.    Dispense:  90 tablet     Refill:  3  . estropipate (OGEN) 0.75 MG tablet    Sig: Take 1 tablet (0.75 mg total) by mouth daily.    Dispense:  90 tablet    Refill:  3  . medroxyPROGESTERone (PROVERA) 5 MG tablet    Sig: 1/2 tab po qd 10 days a month as directed.  Brand name necessary.    Dispense:  90 tablet    Refill:  3  . meloxicam (MOBIC) 15 MG tablet    Sig: TAKE 1/2 TO 1 TABLET ONCE  DAILY    Dispense:  90 tablet    Refill:  1  . propranolol (INDERAL) 10 MG tablet    Sig: Take 1 tablet (10 mg total) by mouth 2 (two) times daily.    Dispense:  180 tablet    Refill:  3  . SYNTHROID 75 MCG tablet    Sig: Take 1 tablet (75 mcg total) by mouth daily.    Dispense:  90 tablet    Refill:  3    CMA served as scribe during this visit. History, Physical and Plan performed by medical provider. Documentation and orders reviewed and attested to.  Ann Held, DO

## 2017-05-05 ENCOUNTER — Encounter: Payer: Self-pay | Admitting: Family Medicine

## 2017-05-05 DIAGNOSIS — I10 Essential (primary) hypertension: Secondary | ICD-10-CM

## 2017-05-05 NOTE — Telephone Encounter (Signed)
i'm forwarding the message to [place], ashley and ebony Since they will be able to answer her questions better than I

## 2017-05-06 ENCOUNTER — Encounter: Payer: Self-pay | Admitting: Family Medicine

## 2017-05-06 MED ORDER — SPIRONOLACTONE 25 MG PO TABS: ORAL_TABLET | ORAL | 3 refills | Status: DC

## 2017-05-06 NOTE — Telephone Encounter (Addendum)
Spironolactone mistakenly resent to local pharmacy instead of mail order. Resent to mail order. Called patient and notified her. She is at work and has not been able to check her MyChart. She was very appreciative and had no further needs at time of call.

## 2017-05-06 NOTE — Addendum Note (Signed)
Addended by: Dorrene German on: 05/06/2017 10:35 AM   Modules accepted: Orders

## 2017-05-06 NOTE — Telephone Encounter (Signed)
Patient states she can only use Brand Name Provera. Plse send to [facility]. ALSO, needs spironolactone (ALDACTONE) 25 MG tablet

## 2017-05-06 NOTE — Telephone Encounter (Signed)
Paperwork faxed back to [facility] to inform pharmacy that per patient, she requires Brand Name Provera; also, resent Spironolactone with corrected dispense amount from #135 [patient takes 1/2 (0.5) tablet daily, #45 is 9-day supply]. All orders should be corrected and now be processing to ship out for patient/SLS 06/21

## 2017-05-06 NOTE — Telephone Encounter (Signed)
Only labs were ordered and she did not have any urinary complaints but I'm happy to order one if she feels she needs to leave one

## 2017-05-06 NOTE — Telephone Encounter (Signed)
Faxed received from [facility] requesting to dispense the generic alternative for Provera tablet, Medroxyprogesterone; sent fax back, along with print copy of 05/04/17 refill sent requesting Rx for Medroxyprogesterone 5 mg tablet to be dispensed with a number of 90-day supply and [3] remaining refills. Patient informed via My Chart message/SLS 06/21

## 2017-05-07 ENCOUNTER — Encounter: Payer: Self-pay | Admitting: Family Medicine

## 2017-05-11 ENCOUNTER — Other Ambulatory Visit: Payer: Self-pay | Admitting: *Deleted

## 2017-05-11 DIAGNOSIS — Z780 Asymptomatic menopausal state: Secondary | ICD-10-CM

## 2017-05-11 MED ORDER — MEDROXYPROGESTERONE ACETATE 5 MG PO TABS: ORAL_TABLET | ORAL | 3 refills | Status: DC

## 2017-05-12 ENCOUNTER — Telehealth: Payer: Self-pay | Admitting: *Deleted

## 2017-05-12 NOTE — Telephone Encounter (Signed)
Received "Brand Penalty Exception Request" for Provera 5mg  tablet, forwarded to provider/SLS

## 2017-05-20 ENCOUNTER — Telehealth: Payer: Self-pay | Admitting: Family Medicine

## 2017-05-20 NOTE — Telephone Encounter (Signed)
Form re-faxed

## 2017-05-20 NOTE — Telephone Encounter (Signed)
Caller name: Janelle  Relation to pt: Case Manager from [employer]  Call back [place] fax # [phone]   Reason for call:  Insurnace check up submission form received but printed Dr. Name missing, refaxing to [phone] Attention Ivin Booty. Please fill out form again and fax to [phone]

## 2017-05-23 DIAGNOSIS — Z1211 Encounter for screening for malignant neoplasm of colon: Secondary | ICD-10-CM | POA: Diagnosis not present

## 2017-05-23 LAB — COLOGUARD: Cologuard: NEGATIVE

## 2017-05-25 NOTE — Telephone Encounter (Signed)
Received PA Approval for brand name Provera Tablet valid 05/19/17 - 05/19/20; forwarded to provider/SLS 07/10

## 2017-06-01 NOTE — Telephone Encounter (Signed)
Left message on vm that shingrix is covered by ins per the [place] reimbursement center.

## 2017-06-18 ENCOUNTER — Encounter: Payer: Self-pay | Admitting: Family Medicine

## 2017-06-18 DIAGNOSIS — Z85820 Personal history of malignant melanoma of skin: Secondary | ICD-10-CM | POA: Diagnosis not present

## 2017-06-18 DIAGNOSIS — D1801 Hemangioma of skin and subcutaneous tissue: Secondary | ICD-10-CM | POA: Diagnosis not present

## 2017-06-18 DIAGNOSIS — L819 Disorder of pigmentation, unspecified: Secondary | ICD-10-CM | POA: Diagnosis not present

## 2017-06-18 DIAGNOSIS — L820 Inflamed seborrheic keratosis: Secondary | ICD-10-CM | POA: Diagnosis not present

## 2017-06-18 DIAGNOSIS — L814 Other melanin hyperpigmentation: Secondary | ICD-10-CM | POA: Diagnosis not present

## 2017-06-18 DIAGNOSIS — D2261 Melanocytic nevi of right upper limb, including shoulder: Secondary | ICD-10-CM | POA: Diagnosis not present

## 2017-06-18 DIAGNOSIS — D2372 Other benign neoplasm of skin of left lower limb, including hip: Secondary | ICD-10-CM | POA: Diagnosis not present

## 2017-06-18 DIAGNOSIS — D2262 Melanocytic nevi of left upper limb, including shoulder: Secondary | ICD-10-CM | POA: Diagnosis not present

## 2017-06-26 ENCOUNTER — Encounter: Payer: Self-pay | Admitting: Family Medicine

## 2017-07-01 ENCOUNTER — Telehealth: Payer: Self-pay | Admitting: Family Medicine

## 2017-07-01 NOTE — Telephone Encounter (Signed)
[place] self  Pt received [facility] call for colonoscopy. She said she did cologard 05/22/17 and it was negative so colonoscopy is not needed. Pt does not want to receive anymore calls.  Ebony, can pt be removed from that list?

## 2017-07-05 NOTE — Telephone Encounter (Signed)
Noted  

## 2017-07-20 NOTE — Telephone Encounter (Signed)
Relation to XQ:KSKS Call back [phone] (M)  Reason for call:  Patient states pharmacy is out of stock,patient requesting orders for the shingrix and would like to schedule nurse visit, please advise

## 2017-08-10 ENCOUNTER — Other Ambulatory Visit (HOSPITAL_COMMUNITY)
Admission: RE | Admit: 2017-08-10 | Discharge: 2017-08-10 | Disposition: A | Discharge status: Home / Self Care | Payer: Medicare Other | Source: Ambulatory Visit | Attending: Family Medicine | Admitting: Family Medicine

## 2017-08-10 ENCOUNTER — Ambulatory Visit (INDEPENDENT_AMBULATORY_CARE_PROVIDER_SITE_OTHER): Payer: Medicare Other | Admitting: Family Medicine

## 2017-08-10 VITALS — BP 118/82 | HR 67 | Resp 18 | Ht 62.0 in | Wt 177.0 lb

## 2017-08-10 DIAGNOSIS — L298 Other pruritus: Secondary | ICD-10-CM

## 2017-08-10 DIAGNOSIS — M545 Low back pain: Secondary | ICD-10-CM | POA: Diagnosis not present

## 2017-08-10 DIAGNOSIS — N898 Other specified noninflammatory disorders of vagina: Secondary | ICD-10-CM

## 2017-08-10 DIAGNOSIS — G8929 Other chronic pain: Secondary | ICD-10-CM | POA: Diagnosis not present

## 2017-08-10 MED ORDER — FLUCONAZOLE 150 MG PO TABS: 150.0000 mg | ORAL_TABLET | Freq: Once | ORAL | 0 refills | Status: AC

## 2017-08-10 NOTE — Patient Instructions (Signed)

## 2017-08-10 NOTE — Progress Notes (Signed)
Patient ID: Wendy Velazquez, female    DOB: July 21, 1941  Age: 76 y.o. MRN: [id number]    Subjective:  Subjective  HPI Wendy Velazquez presents for back pain. She saw dr Cay Schillings many years ago.    Review of Systems  Constitutional: Negative for appetite change, diaphoresis, fatigue and unexpected weight change.  Eyes: Negative for pain, redness and visual disturbance.  Respiratory: Negative for cough, chest tightness, shortness of breath and wheezing.   Cardiovascular: Negative for chest pain, palpitations and leg swelling.  Endocrine: Negative for cold intolerance, heat intolerance, polydipsia, polyphagia and polyuria.  Genitourinary: Negative for difficulty urinating, dysuria and frequency.  Musculoskeletal: Positive for arthralgias and back pain.  Neurological: Negative for dizziness, light-headedness, numbness and headaches.    History Past Medical History:  Diagnosis Date  . Arthritis   . Hypertension   . Melanoma ([place])   . MRSA (methicillin resistant staph aureus) culture positive   . Osteopenia   . Rapid heart beat    benigh  . Transitional cell carcinoma ([place])     She has a past surgical history that includes Tonsillectomy; Abdominal hysterectomy (1987); Melanoma excision (1977); Melanoma excision (06-24-09); Cataract extraction (2004); Skin cancer excision (1999); Eye surgery; and teeth implants.   Her family history includes Alzheimer's disease in her mother; Dementia in her mother; Diabetes in her mother; Hypertension in her father; Stroke in her father.She reports that she quit smoking about 36 years ago. She has a 4.50 pack-year smoking history. She has never used smokeless tobacco. She reports that she drinks about 0.6 - 1.2 oz of alcohol per week . She reports that she does not use drugs.  Current Outpatient Prescriptions on File Prior to Visit  Medication Sig Dispense Refill  . acetaminophen (TYLENOL ARTHRITIS PAIN) 650 MG CR tablet Take 650 mg by mouth at bedtime.       . Alpha-D-Galactosidase (BEANO PO) Take 1 tablet by mouth as needed.    Marland Kitchen amLODipine (NORVASC) 5 MG tablet Take 1 tablet (5 mg total) by mouth daily. 90 tablet 3  . Digestive Enzymes (ENZYMATIC DIGESTANT PO) Take by mouth. Enzymatic Therapy probiotic pearls    . estropipate (OGEN) 0.75 MG tablet Take 1 tablet (0.75 mg total) by mouth daily. 90 tablet 3  . fluticasone (FLONASE) 50 MCG/ACT nasal spray Place 2 sprays into both nostrils daily. 16 g 1  . Glucos-MSM-C-Mn-Ginger-Willow (GLUCOSAMINE MSM COMPLEX PO) Take 1 tablet by mouth daily.    . GuaiFENesin (MUCINEX PO) Take 200 mg by mouth at bedtime.    Marland Kitchen HYDROcodone-homatropine (HYCODAN) 5-1.5 MG/5ML syrup Take 5 mLs by mouth every 8 (eight) hours as needed for cough. 120 mL 0  . medroxyPROGESTERone (PROVERA) 5 MG tablet 1/2 tab po qd 10 days a month as directed.  Brand name necessary. 45 tablet 3  . meloxicam (MOBIC) 15 MG tablet TAKE 1/2 TO 1 TABLET ONCE  DAILY 90 tablet 1  . propranolol (INDERAL) 10 MG tablet Take 1 tablet (10 mg total) by mouth 2 (two) times daily. 180 tablet 3  . ranitidine (ZANTAC 75) 75 MG tablet Take 75 mg by mouth daily.      Marland Kitchen spironolactone (ALDACTONE) 25 MG tablet 1/2 tab po qd 45 tablet 3  . SYNTHROID 75 MCG tablet Take 1 tablet (75 mcg total) by mouth daily. 90 tablet 3   No current facility-administered medications on file prior to visit.      Objective:  Objective  Physical Exam  Constitutional: She is oriented  to person, place, and time. She appears well-developed and well-nourished.  HENT:  Head: Normocephalic and atraumatic.  Eyes: Conjunctivae and EOM are normal.  Neck: Normal range of motion. Neck supple. No JVD present. Carotid bruit is not present. No thyromegaly present.  Cardiovascular: Normal rate, regular rhythm and normal heart sounds.   No murmur heard. Pulmonary/Chest: Effort normal and breath sounds normal. No respiratory distress. She has no wheezes. She has no rales. She exhibits no  tenderness.  Musculoskeletal: She exhibits tenderness. She exhibits no edema.  Neurological: She is alert and oriented to person, place, and time. She displays normal reflexes. She exhibits normal muscle tone.  Psychiatric: She has a normal mood and affect.   BP 118/82   Pulse 67   Resp 18   Ht 5\' 2"  (1.575 m)   Wt 177 lb (80.3 kg)   SpO2 95%   BMI 32.37 kg/m  Wt Readings from Last 3 Encounters:  08/11/17 178 lb (80.7 kg)  08/10/17 177 lb (80.3 kg)  05/04/17 175 lb 12.8 oz (79.7 kg)     Lab Results  Component Value Date   WBC 6.9 05/04/2017   HGB 14.6 05/04/2017   HCT 43.4 05/04/2017   PLT 204.0 05/04/2017   GLUCOSE 105 (H) 05/04/2017   CHOL 160 05/04/2017   TRIG 166.0 (H) 05/04/2017   HDL 40.80 05/04/2017   LDLDIRECT 102.0 04/18/2015   LDLCALC 86 05/04/2017   ALT 17 05/04/2017   AST 19 05/04/2017   NA 141 05/04/2017   K 3.9 05/04/2017   CL 102 05/04/2017   CREATININE 0.97 05/04/2017   BUN 21 05/04/2017   CO2 31 05/04/2017   TSH 1.29 05/04/2017   MICROALBUR 2.0 (H) 04/18/2015    Mm Digital Screening Bilateral  Result Date: 02/12/2014 CLINICAL DATA:  Screening. EXAM: DIGITAL SCREENING BILATERAL MAMMOGRAM WITH CAD COMPARISON:  Previous exam(s). ACR Breast Density Category c: The breast tissue is heterogeneously dense, which may obscure small masses. FINDINGS: There are no findings suspicious for malignancy. Images were processed with CAD. IMPRESSION: No mammographic evidence of malignancy. A result letter of this screening mammogram will be mailed directly to the patient. RECOMMENDATION: Screening mammogram in one year. (Code:SM-B-01Y) BI-RADS CATEGORY  1: Negative. Electronically Signed   By: Luberta Robertson M.D.   On: 02/12/2014 17:04     Assessment & Plan:  Plan  I am having Ms. Coppola start on fluconazole. I am also having her maintain her acetaminophen, ranitidine, Digestive Enzymes (ENZYMATIC DIGESTANT PO), Glucos-MSM-C-Mn-Ginger-Willow (GLUCOSAMINE MSM COMPLEX  PO), fluticasone, HYDROcodone-homatropine, GuaiFENesin (MUCINEX PO), Alpha-D-Galactosidase (BEANO PO), amLODipine, estropipate, meloxicam, propranolol, SYNTHROID, spironolactone, and medroxyPROGESTERone.  Meds ordered this encounter  Medications  . fluconazole (DIFLUCAN) 150 MG tablet    Sig: Take 1 tablet (150 mg total) by mouth once. May repeat in 3 days prn    Dispense:  2 tablet    Refill:  0    Problem List Items Addressed This Visit    None    Visit Diagnoses    Chronic low back pain, unspecified back pain laterality, with sciatica presence unspecified    -  Primary   Relevant Orders   Ambulatory referral to Orthopedic Surgery   POCT Urinalysis Dipstick (Automated)   Vaginal itching       Relevant Orders   Urine cytology ancillary only      Follow-up: Return if symptoms worsen or fail to improve.  Ann Held, DO

## 2017-08-11 ENCOUNTER — Ambulatory Visit (INDEPENDENT_AMBULATORY_CARE_PROVIDER_SITE_OTHER): Payer: Medicare Other | Admitting: Vascular Surgery

## 2017-08-11 ENCOUNTER — Encounter: Payer: Self-pay | Admitting: Vascular Surgery

## 2017-08-11 VITALS — BP 146/91 | HR 63 | Temp 97.5°F | Resp 16 | Ht 63.0 in | Wt 178.0 lb

## 2017-08-11 DIAGNOSIS — I8393 Asymptomatic varicose veins of bilateral lower extremities: Secondary | ICD-10-CM | POA: Diagnosis not present

## 2017-08-11 NOTE — Progress Notes (Signed)
Subjective:     Patient ID: Wendy Velazquez, female   DOB: January 06, 1941, 76 y.o.   MRN: [id number]  HPI This 76 year old female was self-referred for evaluation of varicose veins in both lower extremities. She has no history of DVT thrombophlebitis stasis ulcers or bleeding. She does have spider veins in her ankle areas and has been diagnosed with "Shamburger's disease". She denies any specific pain in her legs although she does have "sciatica in the right leg" and has for 20 years. She does not were elastic compression stockings. She develops no swelling as the day progresses  Past Medical History:  Diagnosis Date  . Arthritis   . Hypertension   . Melanoma ([place])   . MRSA (methicillin resistant staph aureus) culture positive   . Osteopenia   . Rapid heart beat    benigh  . Transitional cell carcinoma [facility])     Social History  Substance Use Topics  . Smoking status: Former Smoker    Packs/day: 0.30    Years: 15.00    Quit date: 03/15/1981  . Smokeless tobacco: Never Used  . Alcohol use 0.6 - 1.2 oz/week    1 - 2 Glasses of wine per week    Family History  Problem Relation Age of Onset  . Diabetes Mother   . Alzheimer's disease Mother   . Dementia Mother   . Stroke Father   . Hypertension Father     Allergies  Allergen Reactions  . Antihistamines, Diphenhydramine-Type Tinitus  . Codeine     Nausea  Can take Hydromet     Current Outpatient Prescriptions:  .  acetaminophen (TYLENOL ARTHRITIS PAIN) 650 MG CR tablet, Take 650 mg by mouth at bedtime.  , Disp: , Rfl:  .  Alpha-D-Galactosidase (BEANO PO), Take 1 tablet by mouth as needed., Disp: , Rfl:  .  amLODipine (NORVASC) 5 MG tablet, Take 1 tablet (5 mg total) by mouth daily., Disp: 90 tablet, Rfl: 3 .  Digestive Enzymes (ENZYMATIC DIGESTANT PO), Take by mouth. Enzymatic Therapy probiotic pearls, Disp: , Rfl:  .  estropipate (OGEN) 0.75 MG tablet, Take 1 tablet (0.75 mg total) by mouth daily., Disp: 90 tablet, Rfl: 3 .   fluticasone (FLONASE) 50 MCG/ACT nasal spray, Place 2 sprays into both nostrils daily., Disp: 16 g, Rfl: 1 .  Glucos-MSM-C-Mn-Ginger-Willow (GLUCOSAMINE MSM COMPLEX PO), Take 1 tablet by mouth daily., Disp: , Rfl:  .  GuaiFENesin (MUCINEX PO), Take 200 mg by mouth at bedtime., Disp: , Rfl:  .  HYDROcodone-homatropine (HYCODAN) 5-1.5 MG/5ML syrup, Take 5 mLs by mouth every 8 (eight) hours as needed for cough., Disp: 120 mL, Rfl: 0 .  medroxyPROGESTERone (PROVERA) 5 MG tablet, 1/2 tab po qd 10 days a month as directed.  Brand name necessary., Disp: 45 tablet, Rfl: 3 .  meloxicam (MOBIC) 15 MG tablet, TAKE 1/2 TO 1 TABLET ONCE  DAILY, Disp: 90 tablet, Rfl: 1 .  propranolol (INDERAL) 10 MG tablet, Take 1 tablet (10 mg total) by mouth 2 (two) times daily., Disp: 180 tablet, Rfl: 3 .  ranitidine (ZANTAC 75) 75 MG tablet, Take 75 mg by mouth daily.  , Disp: , Rfl:  .  spironolactone (ALDACTONE) 25 MG tablet, 1/2 tab po qd, Disp: 45 tablet, Rfl: 3 .  SYNTHROID 75 MCG tablet, Take 1 tablet (75 mcg total) by mouth daily., Disp: 90 tablet, Rfl: 3  Vitals:   08/11/17 1316 08/11/17 1317  BP: (!) 150/90 (!) 146/91  Pulse: 63   Resp:  16   Temp: (!) 97.5 F (36.4 C)   SpO2: 97%   Weight: 178 lb (80.7 kg)   Height: 5\' 3"  (1.6 m)     Body mass index is 31.53 kg/m.         Review of Systems Denies chest pain, dyspnea on exertion, PND, orthopnea, hemoptysis, claudication-see history of present illness    Objective:   Physical Exam BP (!) 146/91 (BP Location: Right Arm, Patient Position: Sitting)   Pulse 63   Temp (!) 97.5 F (36.4 C)   Resp 16   Ht 5\' 3"  (1.6 m)   Wt 178 lb (80.7 kg)   SpO2 97%   BMI 31.53 kg/m     Gen.-alert and oriented x3 in no apparent distress HEENT normal for age Lungs no rhonchi or wheezing Cardiovascular regular rhythm no murmurs carotid pulses 3+ palpable no bruits audible Abdomen soft nontender no palpable masses Musculoskeletal free of  major  deformities Skin clear -no rashes Neurologic normal Lower extremities 3+ femoral and dorsalis pedis pulses palpable bilaterally with no edema Spider veins in right medial malleolar area. Veins on dorsum of right foot quite prominent but not varicosed, a few spider veins in the medial and lateral thigh areas bilaterally. No hyperpigmentation or ulceration noted.  Today I visualized bilateral great saphenous veins with the SonoSite ultrasound at the bedside in both great saphenous veins are normal in caliber with no evidence of reflux       Assessment:     Spider veins of bilateral lower extremities-asymptomatic No evidence of significant venous reflux in superficial system on bedside SonoSite ultrasound exam    Plan:     No intervention is indicated on the arterial or vascular system Feel that her spider veins are asymptomatic and do not require treatment

## 2017-08-12 ENCOUNTER — Encounter: Payer: Self-pay | Admitting: Family Medicine

## 2017-08-13 DIAGNOSIS — Z8551 Personal history of malignant neoplasm of bladder: Secondary | ICD-10-CM | POA: Diagnosis not present

## 2017-08-13 LAB — URINE CYTOLOGY ANCILLARY ONLY
Bacterial vaginitis: NEGATIVE
Candida vaginitis: NEGATIVE

## 2017-08-19 DIAGNOSIS — M5441 Lumbago with sciatica, right side: Secondary | ICD-10-CM | POA: Diagnosis not present

## 2017-08-19 DIAGNOSIS — G8929 Other chronic pain: Secondary | ICD-10-CM | POA: Diagnosis not present

## 2017-08-24 ENCOUNTER — Encounter: Payer: Self-pay | Admitting: Family Medicine

## 2017-08-24 NOTE — Telephone Encounter (Signed)
If she had the flu shot the last time she was here the nurse who gave it did not put it in-- I do not see it but it may be there ---  Will you look into this  We doc the cologuard and took out colonoscopy so they should not be sending her my chart about that ---- but we do not send out the my chart --- not sure how to stop it  I will also forward to [place]

## 2017-08-24 NOTE — Telephone Encounter (Signed)
Chart reviewed, I don't see where Pt has had a nurse visit with our office recently, unsure when she is talking about she could have had the flu shot?

## 2017-08-24 NOTE — Telephone Encounter (Signed)
[facility] and we have the cologuard documented in her chart ---- colonoscopy does not come up on our end

## 2017-08-26 NOTE — Telephone Encounter (Signed)
Pt stated she had the flu vaccine at the visit here with you on 08/10/2017 at 11 AM.

## 2017-08-26 NOTE — Telephone Encounter (Signed)
[facility] --- we need to know where she received it so we can call and get documentation

## 2017-09-09 DIAGNOSIS — M5441 Lumbago with sciatica, right side: Secondary | ICD-10-CM | POA: Diagnosis not present

## 2017-09-09 DIAGNOSIS — G8929 Other chronic pain: Secondary | ICD-10-CM | POA: Diagnosis not present

## 2017-11-20 ENCOUNTER — Encounter: Payer: Self-pay | Admitting: Family Medicine

## 2017-11-22 ENCOUNTER — Encounter: Payer: Self-pay | Admitting: Family Medicine

## 2017-11-22 ENCOUNTER — Other Ambulatory Visit: Payer: Self-pay | Admitting: Family Medicine

## 2017-11-22 DIAGNOSIS — Z8619 Personal history of other infectious and parasitic diseases: Secondary | ICD-10-CM

## 2017-11-22 DIAGNOSIS — M549 Dorsalgia, unspecified: Secondary | ICD-10-CM

## 2017-11-22 NOTE — Telephone Encounter (Signed)
Referral entered  

## 2017-11-23 DIAGNOSIS — H43811 Vitreous degeneration, right eye: Secondary | ICD-10-CM | POA: Diagnosis not present

## 2017-11-23 DIAGNOSIS — Z961 Presence of intraocular lens: Secondary | ICD-10-CM | POA: Diagnosis not present

## 2017-11-24 ENCOUNTER — Encounter: Payer: Self-pay | Admitting: Family Medicine

## 2017-11-26 NOTE — Telephone Encounter (Signed)
I spoke with the patient she explained to me that [facility] was secondary, and medicare was primary. I informed her that when I called [facility] they stated she was not in the system. Patient explained she always has trouble on our end with the [employer]. I apologized and let her know that she did not need a prior authorization because medicare was her primary insurance.

## 2017-12-03 ENCOUNTER — Ambulatory Visit (HOSPITAL_COMMUNITY)
Admission: RE | Admit: 2017-12-03 | Discharge: 2017-12-03 | Disposition: A | Discharge status: Home / Self Care | Payer: Medicare Other | Source: Ambulatory Visit | Attending: Family Medicine | Admitting: Family Medicine

## 2017-12-03 ENCOUNTER — Encounter (HOSPITAL_COMMUNITY): Payer: Self-pay | Admitting: Radiology

## 2017-12-03 ENCOUNTER — Other Ambulatory Visit: Payer: Self-pay | Admitting: Family Medicine

## 2017-12-03 DIAGNOSIS — M4807 Spinal stenosis, lumbosacral region: Secondary | ICD-10-CM | POA: Diagnosis not present

## 2017-12-03 DIAGNOSIS — M5126 Other intervertebral disc displacement, lumbar region: Secondary | ICD-10-CM | POA: Diagnosis not present

## 2017-12-03 DIAGNOSIS — M549 Dorsalgia, unspecified: Secondary | ICD-10-CM | POA: Diagnosis present

## 2017-12-03 DIAGNOSIS — M48061 Spinal stenosis, lumbar region without neurogenic claudication: Secondary | ICD-10-CM | POA: Insufficient documentation

## 2017-12-16 DIAGNOSIS — L814 Other melanin hyperpigmentation: Secondary | ICD-10-CM | POA: Diagnosis not present

## 2017-12-16 DIAGNOSIS — D1801 Hemangioma of skin and subcutaneous tissue: Secondary | ICD-10-CM | POA: Diagnosis not present

## 2017-12-16 DIAGNOSIS — L308 Other specified dermatitis: Secondary | ICD-10-CM | POA: Diagnosis not present

## 2017-12-16 DIAGNOSIS — D225 Melanocytic nevi of trunk: Secondary | ICD-10-CM | POA: Diagnosis not present

## 2017-12-16 DIAGNOSIS — Z85820 Personal history of malignant melanoma of skin: Secondary | ICD-10-CM | POA: Diagnosis not present

## 2017-12-16 DIAGNOSIS — L821 Other seborrheic keratosis: Secondary | ICD-10-CM | POA: Diagnosis not present

## 2017-12-30 DIAGNOSIS — M7138 Other bursal cyst, other site: Secondary | ICD-10-CM | POA: Diagnosis not present

## 2017-12-30 DIAGNOSIS — M5416 Radiculopathy, lumbar region: Secondary | ICD-10-CM | POA: Diagnosis not present

## 2017-12-30 DIAGNOSIS — M419 Scoliosis, unspecified: Secondary | ICD-10-CM | POA: Diagnosis not present

## 2017-12-30 DIAGNOSIS — I10 Essential (primary) hypertension: Secondary | ICD-10-CM | POA: Diagnosis not present

## 2017-12-30 DIAGNOSIS — M9983 Other biomechanical lesions of lumbar region: Secondary | ICD-10-CM | POA: Diagnosis not present

## 2017-12-30 DIAGNOSIS — Z6832 Body mass index (BMI) 32.0-32.9, adult: Secondary | ICD-10-CM | POA: Diagnosis not present

## 2018-01-13 DIAGNOSIS — M419 Scoliosis, unspecified: Secondary | ICD-10-CM | POA: Diagnosis not present

## 2018-01-14 ENCOUNTER — Telehealth: Payer: Self-pay | Admitting: Family Medicine

## 2018-01-14 NOTE — Telephone Encounter (Unsigned)
Copied from [place] [id number]. Topic: Appointment Scheduling - Scheduling Inquiry for Clinic >> Jan 14, 2018  2:11 PM Hewitt Shorts wrote: Reason for CRM: pt is trying to schedule a CPE in June for this year with Dr. Etter Sjogren but the PEC cant not locate a time that the patient can schedule that covers her insurance coverage like 04/1917 and be able to get her meds refilled when it is time  Best number

## 2018-01-20 DIAGNOSIS — M419 Scoliosis, unspecified: Secondary | ICD-10-CM | POA: Diagnosis not present

## 2018-01-24 DIAGNOSIS — M419 Scoliosis, unspecified: Secondary | ICD-10-CM | POA: Diagnosis not present

## 2018-01-31 DIAGNOSIS — M419 Scoliosis, unspecified: Secondary | ICD-10-CM | POA: Diagnosis not present

## 2018-02-07 DIAGNOSIS — M419 Scoliosis, unspecified: Secondary | ICD-10-CM | POA: Diagnosis not present

## 2018-02-14 DIAGNOSIS — M419 Scoliosis, unspecified: Secondary | ICD-10-CM | POA: Diagnosis not present

## 2018-03-22 DIAGNOSIS — Q72812 Congenital shortening of left lower limb: Secondary | ICD-10-CM | POA: Diagnosis not present

## 2018-03-22 DIAGNOSIS — M9903 Segmental and somatic dysfunction of lumbar region: Secondary | ICD-10-CM | POA: Diagnosis not present

## 2018-03-22 DIAGNOSIS — M9905 Segmental and somatic dysfunction of pelvic region: Secondary | ICD-10-CM | POA: Diagnosis not present

## 2018-03-22 DIAGNOSIS — M5116 Intervertebral disc disorders with radiculopathy, lumbar region: Secondary | ICD-10-CM | POA: Diagnosis not present

## 2018-03-29 DIAGNOSIS — M5116 Intervertebral disc disorders with radiculopathy, lumbar region: Secondary | ICD-10-CM | POA: Diagnosis not present

## 2018-03-29 DIAGNOSIS — Q72812 Congenital shortening of left lower limb: Secondary | ICD-10-CM | POA: Diagnosis not present

## 2018-03-29 DIAGNOSIS — M9903 Segmental and somatic dysfunction of lumbar region: Secondary | ICD-10-CM | POA: Diagnosis not present

## 2018-03-29 DIAGNOSIS — M9905 Segmental and somatic dysfunction of pelvic region: Secondary | ICD-10-CM | POA: Diagnosis not present

## 2018-03-30 DIAGNOSIS — M5116 Intervertebral disc disorders with radiculopathy, lumbar region: Secondary | ICD-10-CM | POA: Diagnosis not present

## 2018-03-30 DIAGNOSIS — Q72812 Congenital shortening of left lower limb: Secondary | ICD-10-CM | POA: Diagnosis not present

## 2018-03-30 DIAGNOSIS — M9903 Segmental and somatic dysfunction of lumbar region: Secondary | ICD-10-CM | POA: Diagnosis not present

## 2018-03-30 DIAGNOSIS — M9905 Segmental and somatic dysfunction of pelvic region: Secondary | ICD-10-CM | POA: Diagnosis not present

## 2018-03-31 DIAGNOSIS — M9903 Segmental and somatic dysfunction of lumbar region: Secondary | ICD-10-CM | POA: Diagnosis not present

## 2018-03-31 DIAGNOSIS — M9905 Segmental and somatic dysfunction of pelvic region: Secondary | ICD-10-CM | POA: Diagnosis not present

## 2018-03-31 DIAGNOSIS — M5116 Intervertebral disc disorders with radiculopathy, lumbar region: Secondary | ICD-10-CM | POA: Diagnosis not present

## 2018-03-31 DIAGNOSIS — Q72812 Congenital shortening of left lower limb: Secondary | ICD-10-CM | POA: Diagnosis not present

## 2018-04-04 DIAGNOSIS — Q72812 Congenital shortening of left lower limb: Secondary | ICD-10-CM | POA: Diagnosis not present

## 2018-04-04 DIAGNOSIS — M5116 Intervertebral disc disorders with radiculopathy, lumbar region: Secondary | ICD-10-CM | POA: Diagnosis not present

## 2018-04-04 DIAGNOSIS — M9903 Segmental and somatic dysfunction of lumbar region: Secondary | ICD-10-CM | POA: Diagnosis not present

## 2018-04-04 DIAGNOSIS — M9905 Segmental and somatic dysfunction of pelvic region: Secondary | ICD-10-CM | POA: Diagnosis not present

## 2018-04-05 DIAGNOSIS — M5116 Intervertebral disc disorders with radiculopathy, lumbar region: Secondary | ICD-10-CM | POA: Diagnosis not present

## 2018-04-05 DIAGNOSIS — M9903 Segmental and somatic dysfunction of lumbar region: Secondary | ICD-10-CM | POA: Diagnosis not present

## 2018-04-05 DIAGNOSIS — Q72812 Congenital shortening of left lower limb: Secondary | ICD-10-CM | POA: Diagnosis not present

## 2018-04-05 DIAGNOSIS — M9905 Segmental and somatic dysfunction of pelvic region: Secondary | ICD-10-CM | POA: Diagnosis not present

## 2018-04-07 DIAGNOSIS — M9905 Segmental and somatic dysfunction of pelvic region: Secondary | ICD-10-CM | POA: Diagnosis not present

## 2018-04-07 DIAGNOSIS — M5116 Intervertebral disc disorders with radiculopathy, lumbar region: Secondary | ICD-10-CM | POA: Diagnosis not present

## 2018-04-07 DIAGNOSIS — M9903 Segmental and somatic dysfunction of lumbar region: Secondary | ICD-10-CM | POA: Diagnosis not present

## 2018-04-07 DIAGNOSIS — Q72812 Congenital shortening of left lower limb: Secondary | ICD-10-CM | POA: Diagnosis not present

## 2018-04-12 DIAGNOSIS — M9905 Segmental and somatic dysfunction of pelvic region: Secondary | ICD-10-CM | POA: Diagnosis not present

## 2018-04-12 DIAGNOSIS — Q72812 Congenital shortening of left lower limb: Secondary | ICD-10-CM | POA: Diagnosis not present

## 2018-04-12 DIAGNOSIS — M9903 Segmental and somatic dysfunction of lumbar region: Secondary | ICD-10-CM | POA: Diagnosis not present

## 2018-04-12 DIAGNOSIS — M5116 Intervertebral disc disorders with radiculopathy, lumbar region: Secondary | ICD-10-CM | POA: Diagnosis not present

## 2018-04-13 DIAGNOSIS — M5116 Intervertebral disc disorders with radiculopathy, lumbar region: Secondary | ICD-10-CM | POA: Diagnosis not present

## 2018-04-13 DIAGNOSIS — M9903 Segmental and somatic dysfunction of lumbar region: Secondary | ICD-10-CM | POA: Diagnosis not present

## 2018-04-13 DIAGNOSIS — Q72812 Congenital shortening of left lower limb: Secondary | ICD-10-CM | POA: Diagnosis not present

## 2018-04-13 DIAGNOSIS — M9905 Segmental and somatic dysfunction of pelvic region: Secondary | ICD-10-CM | POA: Diagnosis not present

## 2018-04-14 DIAGNOSIS — Q72812 Congenital shortening of left lower limb: Secondary | ICD-10-CM | POA: Diagnosis not present

## 2018-04-14 DIAGNOSIS — M9903 Segmental and somatic dysfunction of lumbar region: Secondary | ICD-10-CM | POA: Diagnosis not present

## 2018-04-14 DIAGNOSIS — M5116 Intervertebral disc disorders with radiculopathy, lumbar region: Secondary | ICD-10-CM | POA: Diagnosis not present

## 2018-04-14 DIAGNOSIS — M9905 Segmental and somatic dysfunction of pelvic region: Secondary | ICD-10-CM | POA: Diagnosis not present

## 2018-04-25 ENCOUNTER — Other Ambulatory Visit: Payer: Self-pay | Admitting: Family Medicine

## 2018-04-25 DIAGNOSIS — M159 Polyosteoarthritis, unspecified: Secondary | ICD-10-CM

## 2018-04-25 DIAGNOSIS — M150 Primary generalized (osteo)arthritis: Principal | ICD-10-CM

## 2018-05-02 ENCOUNTER — Encounter: Payer: No Typology Code available for payment source | Admitting: Family Medicine

## 2018-05-02 NOTE — Progress Notes (Signed)
Subjective:   Wendy Velazquez is a 77 y.o. female who presents for Medicare Annual (Subsequent) preventive examination.  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  Pt also loves to ballroom dance. She dances every Friday and every other Saturday  Review of Systems: No ROS.  Medicare Wellness Visit. Additional risk factors are reflected in the social history. Cardiac Risk Factors include: advanced age (>2men, >44 women);hypertension Sleep patterns: Reports sleeping very well each night. Home Safety/Smoke Alarms: Feels safe in home. Smoke alarms in place.  Living environment; residence and Firearm Safety: Lives alone in 1 story town home. Walk-in shower.   Female:         Mammo- active order       Dexa scan- active order [facility]- Cologuard 05/23/17-neg  Objective:     Vitals: BP 130/82 (BP Location: Left Arm, Patient Position: Sitting, Cuff Size: Normal)   Pulse 60   Ht 5\' 3"  (1.6 m)   Wt 177 lb 12.8 oz (80.6 kg)   SpO2 98%   BMI 31.50 kg/m   Body mass index is 31.5 kg/m.  Advanced Directives 05/05/2018 05/04/2017 04/21/2016  Does Patient Have a Medical Advance Directive? Yes Yes Yes  Type of Paramedic of [place];Living will [place];Living will [place];Living will  Does patient want to make changes to medical advance directive? - - No - Patient declined  Copy of [place] in Chart? No - copy requested No - copy requested No - copy requested    Tobacco Social History   Tobacco Use  Smoking Status Former Smoker  . Packs/day: 0.30  . Years: 15.00  . Pack years: 4.50  . Last attempt to quit: 03/15/1981  . Years since quitting: 37.1  Smokeless Tobacco Never Used     Counseling given: Not Answered   Clinical Intake: Pain : No/denies pain   Past Medical History:  Diagnosis Date  . Arthritis   . Hypertension   . Melanoma ([place])   .  MRSA (methicillin resistant staph aureus) culture positive   . Osteopenia   . Rapid heart beat    benigh  . Transitional cell carcinoma [facility])    Past Surgical History:  Procedure Laterality Date  . ABDOMINAL HYSTERECTOMY  1987   partial  . CATARACT EXTRACTION  2004  . EYE SURGERY     cataract b/l  . MELANOMA EXCISION  1977   left leg  . MELANOMA EXCISION  06-24-09   r foot  . SKIN CANCER EXCISION  1999   bladder transitional cell  . teeth implants    . TONSILLECTOMY     Family History  Problem Relation Age of Onset  . Diabetes Mother   . Alzheimer's disease Mother   . Dementia Mother   . Stroke Father   . Hypertension Father    Social History   Socioeconomic History  . Marital status: Widowed    Spouse name: Not on file  . Number of children: Not on file  . Years of education: Not on file  . Highest education level: Not on file  Occupational History  . Occupation: Product/process development scientist: [employer]    Comment: 4 days a week  Social Needs  . Financial resource strain: Not on file  . Food insecurity:    Worry: Not on file    Inability: Not on file  . Transportation  needs:    Medical: Not on file    Non-medical: Not on file  Tobacco Use  . Smoking status: Former Smoker    Packs/day: 0.30    Years: 15.00    Pack years: 4.50    Last attempt to quit: 03/15/1981    Years since quitting: 37.1  . Smokeless tobacco: Never Used  Substance and Sexual Activity  . Alcohol use: Yes    Alcohol/week: 0.6 - 1.2 oz    Types: 1 - 2 Glasses of wine per week  . Drug use: No  . Sexual activity: Not Currently    Partners: Male  Lifestyle  . Physical activity:    Days per week: Not on file    Minutes per session: Not on file  . Stress: Not on file  Relationships  . Social connections:    Talks on phone: Not on file    Gets together: Not on file    Attends religious service: Not on file    Active member of club or organization: Not on file    Attends meetings  of clubs or organizations: Not on file    Relationship status: Not on file  Other Topics Concern  . Not on file  Social History Narrative   Exercise--- dancing    Outpatient Encounter Medications as of 05/05/2018  Medication Sig  . acetaminophen (TYLENOL ARTHRITIS PAIN) 650 MG CR tablet Take 650 mg by mouth at bedtime.    Marland Kitchen amLODipine (NORVASC) 5 MG tablet Take 1 tablet (5 mg total) by mouth daily.  . Digestive Enzymes (ENZYMATIC DIGESTANT PO) Take by mouth. Enzymatic Therapy probiotic pearls  . estropipate (OGEN) 0.75 MG tablet Take 1 tablet (0.75 mg total) by mouth daily.  . fluticasone (FLONASE) 50 MCG/ACT nasal spray Place 2 sprays into both nostrils daily.  Donnie Aho (GLUCOSAMINE MSM COMPLEX PO) Take 1 tablet by mouth daily.  . GuaiFENesin (MUCINEX PO) Take 200 mg by mouth at bedtime.  . medroxyPROGESTERone (PROVERA) 5 MG tablet 1/2 tab po qd 10 days a month as directed.  Brand name necessary.  . meloxicam (MOBIC) 15 MG tablet TAKE 1/2 TO 1 TABLET ONCE  DAILY  . propranolol (INDERAL) 10 MG tablet Take 1 tablet (10 mg total) by mouth 2 (two) times daily.  . ranitidine (ZANTAC 75) 75 MG tablet Take 75 mg by mouth daily.    Marland Kitchen spironolactone (ALDACTONE) 25 MG tablet 1/2 tab po qd  . SYNTHROID 75 MCG tablet Take 1 tablet (75 mcg total) by mouth daily.  Marland Kitchen HYDROcodone-homatropine (HYCODAN) 5-1.5 MG/5ML syrup Take 5 mLs by mouth every 8 (eight) hours as needed for cough. (Patient not taking: Reported on 05/05/2018)  . [DISCONTINUED] Alpha-D-Galactosidase (BEANO PO) Take 1 tablet by mouth as needed.   No facility-administered encounter medications on file as of 05/05/2018.     Activities of Daily Living In your present state of health, do you have any difficulty performing the following activities: 05/05/2018  Hearing? Y  Comment wearing hearing aids. Loves them.  Vision? N  Comment hx cataract sx. wears reading glasses.  Difficulty concentrating or making decisions?  N  Walking or climbing stairs? N  Dressing or bathing? N  Doing errands, shopping? N  Preparing Food and eating ? N  Using the Toilet? N  In the past six months, have you accidently leaked urine? N  Do you have problems with loss of bowel control? N  Managing your Medications? N  Managing your Finances? N  Housekeeping or  managing your Housekeeping? N  Some recent data might be hidden    Patient Care Team: Carollee Herter, Alferd Apa, DO as PCP - General Luberta Mutter, MD as Consulting Physician (Ophthalmology) Myrlene Broker, MD as Attending Physician (Urology) Rolm Bookbinder, MD as Consulting Physician (Dermatology)    Assessment:   This is a routine wellness examination for [place]. Physical assessment deferred to PCP.  Exercise Activities and Dietary recommendations Current Exercise Habits: Home exercise routine, Type of exercise: stretching, Time (Minutes): 10, Frequency (Times/Week): 7, Weekly Exercise (Minutes/Week): 70, Exercise limited by: None identified Diet (meal preparation, eat out, water intake, caffeinated beverages, dairy products, fruits and vegetables): well balanced, on average, 3 meals per day   Goals    . Maintain current health       Fall Risk Fall Risk  05/05/2018 05/04/2017 04/21/2016 08/16/2015 04/18/2015  Falls in the past year? No No No No No    Depression Screen PHQ 2/9 Scores 05/05/2018 05/04/2017 04/21/2016 08/16/2015  PHQ - 2 Score 0 0 0 0  Exception Documentation - - - -     Cognitive Function Ad8 score reviewed for issues:  Issues making decisions:no  Less interest in hobbies / activities:no  Repeats questions, stories (family complaining):no  Trouble using ordinary gadgets (microwave, computer, phone):no  Forgets the month or year: no  Mismanaging finances: no  Remembering appts:no  Daily problems with thinking and/or memory:no Ad8 score is=0     MMSE - Mini Mental State Exam 05/04/2017 04/21/2016  Orientation to time 5 5    Orientation to Place 5 5  Registration 3 3  Attention/ Calculation 5 5  Recall 3 3  Language- name 2 objects 2 2  Language- repeat 1 1  Language- follow 3 step command 3 3  Language- read & follow direction 1 1  Write a sentence 1 1  Copy design 1 1  Total score 30 30        Immunization History  Administered Date(s) Administered  . DTaP 06/05/2013  . Influenza Whole 09/12/2008  . Influenza, High Dose Seasonal PF 08/29/2014  . Influenza-Unspecified 11/30/2012, 09/16/2013, 08/17/2016  . PPD Test 02/11/2016, 02/18/2016  . Pneumococcal Conjugate-13 04/21/2016  . Pneumococcal Polysaccharide-23 03/02/2008  . Td 11/29/2002  . Tdap 06/05/2013  . Zoster 03/02/2008    Screening Tests Health Maintenance  Topic Date Due  . MAMMOGRAM  02/13/2015  . INFLUENZA VACCINE  06/16/2018  . Fecal DNA (Cologuard)  05/23/2020  . TETANUS/TDAP  06/06/2023  . DEXA SCAN  Completed  . PNA vac Low Risk Adult  Completed      Plan:   Follow up with Dr.Lowne today as scheduled  Please schedule your next medicare wellness visit with me in 1 yr.  Continue to eat heart healthy diet (full of fruits, vegetables, whole grains, lean protein, water--limit salt, fat, and sugar intake) and increase physical activity as tolerated.  Bring a copy of your living will and/or healthcare power of attorney to your next office visit.   I have personally reviewed and noted the following in the patient's chart:   . Medical and social history . Use of alcohol, tobacco or illicit drugs  . Current medications and supplements . Functional ability and status . Nutritional status . Physical activity . Advanced directives . List of other physicians . Hospitalizations, surgeries, and ER visits in previous 12 months . Vitals . Screenings to include cognitive, depression, and falls . Referrals and appointments  In addition, I have reviewed and  discussed with patient certain preventive protocols, quality metrics,  and best practice recommendations. A written personalized care plan for preventive services as well as general preventive health recommendations were provided to patient.     Shela Nevin, [place]  05/05/2018

## 2018-05-05 ENCOUNTER — Ambulatory Visit (INDEPENDENT_AMBULATORY_CARE_PROVIDER_SITE_OTHER): Payer: Medicare Other | Admitting: Family Medicine

## 2018-05-05 ENCOUNTER — Encounter: Payer: Self-pay | Admitting: *Deleted

## 2018-05-05 ENCOUNTER — Encounter: Payer: Self-pay | Admitting: Family Medicine

## 2018-05-05 ENCOUNTER — Ambulatory Visit (INDEPENDENT_AMBULATORY_CARE_PROVIDER_SITE_OTHER): Payer: Medicare Other | Admitting: *Deleted

## 2018-05-05 ENCOUNTER — Ambulatory Visit: Payer: Medicare Other | Admitting: *Deleted

## 2018-05-05 VITALS — BP 130/82 | HR 63 | Resp 16 | Ht 63.0 in | Wt 177.0 lb

## 2018-05-05 VITALS — BP 130/82 | HR 60 | Ht 63.0 in | Wt 177.8 lb

## 2018-05-05 DIAGNOSIS — M159 Polyosteoarthritis, unspecified: Secondary | ICD-10-CM | POA: Insufficient documentation

## 2018-05-05 DIAGNOSIS — Z0000 Encounter for general adult medical examination without abnormal findings: Secondary | ICD-10-CM | POA: Diagnosis not present

## 2018-05-05 DIAGNOSIS — G8929 Other chronic pain: Secondary | ICD-10-CM | POA: Insufficient documentation

## 2018-05-05 DIAGNOSIS — M5441 Lumbago with sciatica, right side: Secondary | ICD-10-CM | POA: Diagnosis not present

## 2018-05-05 DIAGNOSIS — E039 Hypothyroidism, unspecified: Secondary | ICD-10-CM

## 2018-05-05 DIAGNOSIS — Z780 Asymptomatic menopausal state: Secondary | ICD-10-CM | POA: Diagnosis not present

## 2018-05-05 DIAGNOSIS — I10 Essential (primary) hypertension: Secondary | ICD-10-CM

## 2018-05-05 DIAGNOSIS — M150 Primary generalized (osteo)arthritis: Secondary | ICD-10-CM | POA: Diagnosis not present

## 2018-05-05 LAB — LIPID PANEL
CHOL/HDL RATIO: 4
Cholesterol: 182 mg/dL (ref 0–200)
HDL: 46.3 mg/dL (ref >39.00)
LDL Cholesterol: 99 mg/dL (ref 0–99)
NonHDL: 135.91
TRIGLYCERIDES: 186 mg/dL — AB (ref 0.0–149.0)
VLDL: 37.2 mg/dL (ref 0.0–40.0)

## 2018-05-05 LAB — COMPREHENSIVE METABOLIC PANEL
ALT: 14 U/L (ref 0–35)
AST: 15 U/L (ref 0–37)
Albumin: 4.5 g/dL (ref 3.5–5.2)
Alkaline Phosphatase: 83 U/L (ref 39–117)
BILIRUBIN TOTAL: 1 mg/dL (ref 0.2–1.2)
BUN: 22 mg/dL (ref 6–23)
CO2: 30 meq/L (ref 19–32)
Calcium: 9.9 mg/dL (ref 8.4–10.5)
Chloride: 103 mEq/L (ref 96–112)
Creatinine, Ser: 1.06 mg/dL (ref 0.40–1.20)
GFR: 53.58 mL/min — ABNORMAL LOW (ref >60.00)
Glucose, Bld: 109 mg/dL — ABNORMAL HIGH (ref 70–99)
Potassium: 4.2 mEq/L (ref 3.5–5.1)
Sodium: 142 mEq/L (ref 135–145)
Total Protein: 6.6 g/dL (ref 6.0–8.3)

## 2018-05-05 LAB — TSH: TSH: 1.24 u[IU]/mL (ref 0.35–4.50)

## 2018-05-05 MED ORDER — AMLODIPINE BESYLATE 5 MG PO TABS: 5.0000 mg | ORAL_TABLET | Freq: Every day | ORAL | 3 refills | Status: DC

## 2018-05-05 MED ORDER — SYNTHROID 75 MCG PO TABS: 75.0000 ug | ORAL_TABLET | Freq: Every day | ORAL | 3 refills | Status: DC

## 2018-05-05 MED ORDER — MEDROXYPROGESTERONE ACETATE 5 MG PO TABS: ORAL_TABLET | ORAL | 3 refills | Status: DC

## 2018-05-05 MED ORDER — ESTROPIPATE 0.75 MG PO TABS: 0.7500 mg | ORAL_TABLET | Freq: Every day | ORAL | 3 refills | Status: DC

## 2018-05-05 MED ORDER — PROPRANOLOL HCL 10 MG PO TABS: 10.0000 mg | ORAL_TABLET | Freq: Two times a day (BID) | ORAL | 3 refills | Status: DC

## 2018-05-05 MED ORDER — MELOXICAM 15 MG PO TABS
ORAL_TABLET | ORAL | 1 refills | Status: DC
Start: 2018-05-05 — End: 2018-12-10

## 2018-05-05 NOTE — Progress Notes (Signed)
Reviewed  Yvonne R Lowne Chase, DO  

## 2018-05-05 NOTE — Assessment & Plan Note (Signed)
Check labs today.

## 2018-05-05 NOTE — Patient Instructions (Signed)
Follow up with Dr.Lowne today as scheduled  Please schedule your next medicare wellness visit with me in 1 yr.  Continue to eat heart healthy diet (full of fruits, vegetables, whole grains, lean protein, water--limit salt, fat, and sugar intake) and increase physical activity as tolerated.  Bring a copy of your living will and/or healthcare power of attorney to your next office visit.   [name] , Thank you for taking time to come for your Medicare Wellness Visit. I appreciate your ongoing commitment to your health goals. Please review the following plan we discussed and let me know if I can assist you in the future.   These are the goals we discussed: Goals    . Maintain current health       This is a list of the screening recommended for you and due dates:  Health Maintenance  Topic Date Due  . Mammogram  02/13/2015  . Flu Shot  06/16/2018  . Cologuard (Stool DNA test)  05/23/2020  . Tetanus Vaccine  06/06/2023  . DEXA scan (bone density measurement)  Completed  . Pneumonia vaccines  Completed    Health Maintenance for Postmenopausal Women Menopause is a normal process in which your reproductive ability comes to an end. This process happens gradually over a span of months to years, usually between the ages of 66 and 6. Menopause is complete when you have missed 12 consecutive menstrual periods. It is important to talk with your health care provider about some of the most common conditions that affect postmenopausal women, such as heart disease, cancer, and bone loss (osteoporosis). Adopting a healthy lifestyle and getting preventive care can help to promote your health and wellness. Those actions can also lower your chances of developing some of these common conditions. What should I know about menopause? During menopause, you may experience a number of symptoms, such as:  Moderate-to-severe hot flashes.  Night sweats.  Decrease in sex drive.  Mood  swings.  Headaches.  Tiredness.  Irritability.  Memory problems.  Insomnia.  Choosing to treat or not to treat menopausal changes is an individual decision that you make with your health care provider. What should I know about hormone replacement therapy and supplements? Hormone therapy products are effective for treating symptoms that are associated with menopause, such as hot flashes and night sweats. Hormone replacement carries certain risks, especially as you become older. If you are thinking about using estrogen or estrogen with progestin treatments, discuss the benefits and risks with your health care provider. What should I know about heart disease and stroke? Heart disease, heart attack, and stroke become more likely as you age. This may be due, in part, to the hormonal changes that your body experiences during menopause. These can affect how your body processes dietary fats, triglycerides, and cholesterol. Heart attack and stroke are both medical emergencies. There are many things that you can do to help prevent heart disease and stroke:  Have your blood pressure checked at least every 1-2 years. High blood pressure causes heart disease and increases the risk of stroke.  If you are 75-59 years old, ask your health care provider if you should take aspirin to prevent a heart attack or a stroke.  Do not use any tobacco products, including cigarettes, chewing tobacco, or electronic cigarettes. If you need help quitting, ask your health care provider.  It is important to eat a healthy diet and maintain a healthy weight. ? Be sure to include plenty of vegetables, fruits, low-fat  dairy products, and lean protein. ? Avoid eating foods that are high in solid fats, added sugars, or salt (sodium).  Get regular exercise. This is one of the most important things that you can do for your health. ? Try to exercise for at least 150 minutes each week. The type of exercise that you do should  increase your heart rate and make you sweat. This is known as moderate-intensity exercise. ? Try to do strengthening exercises at least twice each week. Do these in addition to the moderate-intensity exercise.  Know your numbers.Ask your health care provider to check your cholesterol and your blood glucose. Continue to have your blood tested as directed by your health care provider.  What should I know about cancer screening? There are several types of cancer. Take the following steps to reduce your risk and to catch any cancer development as early as possible. Breast Cancer  Practice breast self-awareness. ? This means understanding how your breasts normally appear and feel. ? It also means doing regular breast self-exams. Let your health care provider know about any changes, no matter how small.  If you are 59 or older, have a clinician do a breast exam (clinical breast exam or CBE) every year. Depending on your age, family history, and medical history, it may be recommended that you also have a yearly breast X-ray (mammogram).  If you have a family history of breast cancer, talk with your health care provider about genetic screening.  If you are at high risk for breast cancer, talk with your health care provider about having an MRI and a mammogram every year.  Breast cancer (BRCA) gene test is recommended for women who have family members with BRCA-related cancers. Results of the assessment will determine the need for genetic counseling and BRCA1 and for BRCA2 testing. BRCA-related cancers include these types: ? Breast. This occurs in males or females. ? Ovarian. ? Tubal. This may also be called fallopian tube cancer. ? Cancer of the abdominal or pelvic lining (peritoneal cancer). ? Prostate. ? Pancreatic.  Cervical, Uterine, and Ovarian Cancer Your health care provider may recommend that you be screened regularly for cancer of the pelvic organs. These include your ovaries, uterus,  and vagina. This screening involves a pelvic exam, which includes checking for microscopic changes to the surface of your cervix (Pap test).  For women ages 21-65, health care providers may recommend a pelvic exam and a Pap test every three years. For women ages 7-65, they may recommend the Pap test and pelvic exam, combined with testing for human papilloma virus (HPV), every five years. Some types of HPV increase your risk of cervical cancer. Testing for HPV may also be done on women of any age who have unclear Pap test results.  Other health care providers may not recommend any screening for nonpregnant women who are considered low risk for pelvic cancer and have no symptoms. Ask your health care provider if a screening pelvic exam is right for you.  If you have had past treatment for cervical cancer or a condition that could lead to cancer, you need Pap tests and screening for cancer for at least 20 years after your treatment. If Pap tests have been discontinued for you, your risk factors (such as having a new sexual partner) need to be reassessed to determine if you should start having screenings again. Some women have medical problems that increase the chance of getting cervical cancer. In these cases, your health care provider may recommend  that you have screening and Pap tests more often.  If you have a family history of uterine cancer or ovarian cancer, talk with your health care provider about genetic screening.  If you have vaginal bleeding after reaching menopause, tell your health care provider.  There are currently no reliable tests available to screen for ovarian cancer.  Lung Cancer Lung cancer screening is recommended for adults 31-69 years old who are at high risk for lung cancer because of a history of smoking. A yearly low-dose CT scan of the lungs is recommended if you:  Currently smoke.  Have a history of at least 30 pack-years of smoking and you currently smoke or have quit  within the past 15 years. A pack-year is smoking an average of one pack of cigarettes per day for one year.  Yearly screening should:  Continue until it has been 15 years since you quit.  Stop if you develop a health problem that would prevent you from having lung cancer treatment.  Colorectal Cancer  This type of cancer can be detected and can often be prevented.  Routine colorectal cancer screening usually begins at age 40 and continues through age 64.  If you have risk factors for colon cancer, your health care provider may recommend that you be screened at an earlier age.  If you have a family history of colorectal cancer, talk with your health care provider about genetic screening.  Your health care provider may also recommend using home test kits to check for hidden blood in your stool.  A small camera at the end of a tube can be used to examine your colon directly (sigmoidoscopy or colonoscopy). This is done to check for the earliest forms of colorectal cancer.  Direct examination of the colon should be repeated every 5-10 years until age 19. However, if early forms of precancerous polyps or small growths are found or if you have a family history or genetic risk for colorectal cancer, you may need to be screened more often.  Skin Cancer  Check your skin from head to toe regularly.  Monitor any moles. Be sure to tell your health care provider: ? About any new moles or changes in moles, especially if there is a change in a mole's shape or color. ? If you have a mole that is larger than the size of a pencil eraser.  If any of your family members has a history of skin cancer, especially at a young age, talk with your health care provider about genetic screening.  Always use sunscreen. Apply sunscreen liberally and repeatedly throughout the day.  Whenever you are outside, protect yourself by wearing long sleeves, pants, a wide-brimmed hat, and sunglasses.  What should I know  about osteoporosis? Osteoporosis is a condition in which bone destruction happens more quickly than new bone creation. After menopause, you may be at an increased risk for osteoporosis. To help prevent osteoporosis or the bone fractures that can happen because of osteoporosis, the following is recommended:  If you are 8-82 years old, get at least 1,000 mg of calcium and at least 600 mg of vitamin D per day.  If you are older than age 29 but younger than age 72, get at least 1,200 mg of calcium and at least 600 mg of vitamin D per day.  If you are older than age 80, get at least 1,200 mg of calcium and at least 800 mg of vitamin D per day.  Smoking and excessive alcohol intake  increase the risk of osteoporosis. Eat foods that are rich in calcium and vitamin D, and do weight-bearing exercises several times each week as directed by your health care provider. What should I know about how menopause affects my mental health? Depression may occur at any age, but it is more common as you become older. Common symptoms of depression include:  Low or sad mood.  Changes in sleep patterns.  Changes in appetite or eating patterns.  Feeling an overall lack of motivation or enjoyment of activities that you previously enjoyed.  Frequent crying spells.  Talk with your health care provider if you think that you are experiencing depression. What should I know about immunizations? It is important that you get and maintain your immunizations. These include:  Tetanus, diphtheria, and pertussis (Tdap) booster vaccine.  Influenza every year before the flu season begins.  Pneumonia vaccine.  Shingles vaccine.  Your health care provider may also recommend other immunizations. This information is not intended to replace advice given to you by your health care provider. Make sure you discuss any questions you have with your health care provider. Document Released: 12/25/2005 Document Revised: 05/22/2016  Document Reviewed: 08/06/2015 Elsevier Interactive Patient Education  2018 [employer].

## 2018-05-05 NOTE — Progress Notes (Signed)
Patient ID: Wendy Velazquez, female    DOB: Aug 05, 1941  Age: 77 y.o. MRN: [id number]    Subjective:  Subjective   HPI [name] presents for f.u thyroid and bp.  She is still c/o sciatic pain in R leg / low back.  She thinks she would like to go to see Dr Hale Drone -- neurosurgeon in [place] at [facility] but she will call when she is ready.    Review of Systems  Constitutional: Negative for chills and fever.  HENT: Negative for congestion and hearing loss.   Eyes: Negative for discharge.  Respiratory: Negative for cough and shortness of breath.   Cardiovascular: Negative for chest pain, palpitations and leg swelling.  Gastrointestinal: Negative for abdominal pain, blood in stool, constipation, diarrhea, nausea and vomiting.  Genitourinary: Negative for dysuria, frequency, hematuria and urgency.  Musculoskeletal: Positive for back pain. Negative for myalgias.  Skin: Negative for rash.  Allergic/Immunologic: Negative for environmental allergies.  Neurological: Positive for numbness. Negative for dizziness, weakness and headaches.  Hematological: Does not bruise/bleed easily.  Psychiatric/Behavioral: Negative for suicidal ideas. The patient is not nervous/anxious.     History Past Medical History:  Diagnosis Date  . Arthritis   . Hypertension   . Melanoma ([place])   . MRSA (methicillin resistant staph aureus) culture positive   . Osteopenia   . Rapid heart beat    benigh  . Transitional cell carcinoma ([place])     She has a past surgical history that includes Tonsillectomy; Abdominal hysterectomy (1987); Melanoma excision (1977); Melanoma excision (06-24-09); Cataract extraction (2004); Skin cancer excision (1999); Eye surgery; and teeth implants.   Her family history includes Alzheimer's disease in her mother; Dementia in her mother; Diabetes in her mother; Hypertension in her father; Stroke in her father.She reports that she quit smoking about 37 years ago. She has a 4.50 pack-year  smoking history. She has never used smokeless tobacco. She reports that she drinks about 0.6 - 1.2 oz of alcohol per week. She reports that she does not use drugs.  Current Outpatient Medications on File Prior to Visit  Medication Sig Dispense Refill  . acetaminophen (TYLENOL ARTHRITIS PAIN) 650 MG CR tablet Take 650 mg by mouth at bedtime.      . Digestive Enzymes (ENZYMATIC DIGESTANT PO) Take by mouth. Enzymatic Therapy probiotic pearls    . fluticasone (FLONASE) 50 MCG/ACT nasal spray Place 2 sprays into both nostrils daily. 16 g 1  . Glucos-MSM-C-Mn-Ginger-Willow (GLUCOSAMINE MSM COMPLEX PO) Take 1 tablet by mouth daily.    . GuaiFENesin (MUCINEX PO) Take 200 mg by mouth at bedtime.    Marland Kitchen HYDROcodone-homatropine (HYCODAN) 5-1.5 MG/5ML syrup Take 5 mLs by mouth every 8 (eight) hours as needed for cough. 120 mL 0  . ranitidine (ZANTAC 75) 75 MG tablet Take 75 mg by mouth daily.      Marland Kitchen spironolactone (ALDACTONE) 25 MG tablet 1/2 tab po qd 45 tablet 3   No current facility-administered medications on file prior to visit.      Objective:  Objective  Physical Exam  Constitutional: She is oriented to person, place, and time. She appears well-developed and well-nourished.  HENT:  Head: Normocephalic and atraumatic.  Eyes: Conjunctivae and EOM are normal.  Neck: Normal range of motion. Neck supple. No JVD present. Carotid bruit is not present. No thyromegaly present.  Cardiovascular: Normal rate, regular rhythm and normal heart sounds.  No murmur heard. Pulmonary/Chest: Effort normal and breath sounds normal. No respiratory distress. She  has no wheezes. She has no rales. She exhibits no tenderness.  Musculoskeletal: She exhibits no edema.  Neurological: She is alert and oriented to person, place, and time.  Psychiatric: She has a normal mood and affect.  Nursing note and vitals reviewed.  BP 130/82   Pulse 63   Resp 16   Ht 5\' 3"  (1.6 m)   Wt 177 lb (80.3 kg)   SpO2 98%   BMI 31.35  kg/m  Wt Readings from Last 3 Encounters:  05/05/18 177 lb (80.3 kg)  05/05/18 177 lb 12.8 oz (80.6 kg)  08/11/17 178 lb (80.7 kg)     Lab Results  Component Value Date   WBC 6.9 05/04/2017   HGB 14.6 05/04/2017   HCT 43.4 05/04/2017   PLT 204.0 05/04/2017   GLUCOSE 109 (H) 05/05/2018   CHOL 182 05/05/2018   TRIG 186.0 (H) 05/05/2018   HDL 46.30 05/05/2018   LDLDIRECT 102.0 04/18/2015   LDLCALC 99 05/05/2018   ALT 14 05/05/2018   AST 15 05/05/2018   NA 142 05/05/2018   K 4.2 05/05/2018   CL 103 05/05/2018   CREATININE 1.06 05/05/2018   BUN 22 05/05/2018   CO2 30 05/05/2018   TSH 1.24 05/05/2018   MICROALBUR 2.0 (H) 04/18/2015    Mr Lumbar Spine Wo Contrast  Result Date: 12/03/2017 CLINICAL DATA:  Back pain with right leg pain EXAM: MRI LUMBAR SPINE WITHOUT CONTRAST TECHNIQUE: Multiplanar, multisequence MR imaging of the lumbar spine was performed. No intravenous contrast was administered. COMPARISON:  None. FINDINGS: Segmentation:  Normal.  Lowest disc space L5-S1 Alignment: Mild dextroscoliosis at L2-3. Normal sagittal alignment. Vertebrae: Negative for fracture or mass. Hemangioma L1 vertebral body. Conus medullaris and cauda equina: Conus extends to the L1-2 level. Conus and cauda equina appear normal. Paraspinal and other soft tissues: Negative Disc levels: T12-L1: Negative L1-2: Mild disc and mild facet degeneration L2-3: Disc degeneration left greater than right. Left-sided endplate spurring and facet hypertrophy causing moderate subarticular stenosis on the left. Possible impingement left L3 nerve root. Spinal canal adequate in size. L3-4: Mild disc degeneration and facet degeneration. Mild subarticular stenosis on the right. L4-5: Severe facet degeneration. Mild disc bulging. Severe subarticular foraminal stenosis on the right with compression of the right L4 and L5 nerve roots. Left foramen patent L5-S1: Mild facet degeneration and mild right foraminal narrowing.  IMPRESSION: Moderate subarticular stenosis on the left at L2-3 Mild subarticular stenosis on the right L3-4 Severe subarticular foraminal encroachment on the right with impingement of right L4 and L5 nerve roots. Mild right foraminal narrowing L5-S1. Electronically Signed   By: Franchot Gallo M.D.   On: 12/03/2017 17:20     Assessment & Plan:  Plan  I have changed Betania A. Lapre's meloxicam and medroxyPROGESTERone. I am also having her maintain her acetaminophen, ranitidine, Digestive Enzymes (ENZYMATIC DIGESTANT PO), Glucos-MSM-C-Mn-Ginger-Willow (GLUCOSAMINE MSM COMPLEX PO), fluticasone, HYDROcodone-homatropine, GuaiFENesin (MUCINEX PO), spironolactone, amLODipine, estropipate, SYNTHROID, and propranolol.  Meds ordered this encounter  Medications  . amLODipine (NORVASC) 5 MG tablet    Sig: Take 1 tablet (5 mg total) by mouth daily.    Dispense:  90 tablet    Refill:  3  . estropipate (OGEN) 0.75 MG tablet    Sig: Take 1 tablet (0.75 mg total) by mouth daily.    Dispense:  90 tablet    Refill:  3  . SYNTHROID 75 MCG tablet    Sig: Take 1 tablet (75 mcg total) by mouth daily.  Dispense:  90 tablet    Refill:  3  . meloxicam (MOBIC) 15 MG tablet    Sig: 1 po qd prn    Dispense:  90 tablet    Refill:  1  . propranolol (INDERAL) 10 MG tablet    Sig: Take 1 tablet (10 mg total) by mouth 2 (two) times daily.    Dispense:  180 tablet    Refill:  3  . medroxyPROGESTERone (PROVERA) 5 MG tablet    Sig: 1 tab po qd 10 days a month as directed.  Brand name necessary.    Dispense:  90 tablet    Refill:  3    90 day supply    Problem List Items Addressed This Visit      Unprioritized   Chronic right-sided low back pain with right-sided sciatica    Pt would like another opinion at [facility] but is not ready to go yet She will call      Relevant Medications   meloxicam (MOBIC) 15 MG tablet   Other Relevant Orders   Ambulatory referral to Neurosurgery   Essential hypertension    Well  controlled, no changes to meds. Encouraged heart healthy diet such as the DASH diet and exercise as tolerated.       Relevant Medications   amLODipine (NORVASC) 5 MG tablet   propranolol (INDERAL) 10 MG tablet   Other Relevant Orders   Comprehensive metabolic panel (Completed)   Lipid panel (Completed)   Hypothyroidism    Check labs today      Relevant Medications   SYNTHROID 75 MCG tablet   propranolol (INDERAL) 10 MG tablet   Other Relevant Orders   TSH (Completed)   Postmenopausal - Primary   Relevant Medications   estropipate (OGEN) 0.75 MG tablet   Primary osteoarthritis involving multiple joints   Relevant Medications   meloxicam (MOBIC) 15 MG tablet    Other Visit Diagnoses    Menopause       Relevant Medications   medroxyPROGESTERone (PROVERA) 5 MG tablet      Follow-up: Return in about 6 months (around 11/04/2018).  Ann Held, DO

## 2018-05-05 NOTE — Assessment & Plan Note (Signed)
Pt would like another opinion at [facility] but is not ready to go yet She will call

## 2018-05-05 NOTE — Patient Instructions (Signed)

## 2018-05-05 NOTE — Assessment & Plan Note (Signed)
Well controlled, no changes to meds. Encouraged heart healthy diet such as the DASH diet and exercise as tolerated.  °

## 2018-05-10 ENCOUNTER — Telehealth: Payer: Self-pay | Admitting: Family Medicine

## 2018-05-10 ENCOUNTER — Encounter: Payer: Self-pay | Admitting: Family Medicine

## 2018-05-10 NOTE — Telephone Encounter (Signed)
Copied from [place] [id number]. Topic: General - Other >> May 10, 2018 11:05 AM Burchel, Abbi R wrote: Reason for CRM:    Lisa ([employer]) called to let Dr. Carollee Herter know that estropipate (OGEN) 0.75 MG tablet is unavailable.Is there an alternative she would like to prescribe?  (Please note: All strains of generic are unavailable.)   Order #: [id number]    Callback #:  [phone]

## 2018-05-11 ENCOUNTER — Encounter: Payer: Self-pay | Admitting: Family Medicine

## 2018-05-11 NOTE — Telephone Encounter (Signed)
Patient calling to let Dr. Etter Sjogren know her insurance suggested Estradiol instead. Patient would like a call back to get an estimate on how long this will take to change it.

## 2018-05-11 NOTE — Telephone Encounter (Signed)
The [facility] would like to know if the provider is going to provide them with an alternative medication. They will cancel that medication tomorrow and a new one will have to be faxed to them at [phone].

## 2018-05-12 ENCOUNTER — Other Ambulatory Visit: Payer: Self-pay | Admitting: Family Medicine

## 2018-05-12 MED ORDER — ESTRADIOL 0.5 MG PO TABS: 0.5000 mg | ORAL_TABLET | Freq: Every day | ORAL | 1 refills | Status: DC

## 2018-05-12 NOTE — Telephone Encounter (Signed)
I changed it 

## 2018-05-12 NOTE — Telephone Encounter (Signed)
Patient notified that med was sent in. 

## 2018-05-13 NOTE — Telephone Encounter (Signed)
See phone note

## 2018-05-13 NOTE — Telephone Encounter (Signed)
See phone note. Medication changed.

## 2018-06-29 DIAGNOSIS — M5416 Radiculopathy, lumbar region: Secondary | ICD-10-CM | POA: Diagnosis not present

## 2018-06-29 DIAGNOSIS — M5136 Other intervertebral disc degeneration, lumbar region: Secondary | ICD-10-CM | POA: Diagnosis not present

## 2018-06-30 DIAGNOSIS — L738 Other specified follicular disorders: Secondary | ICD-10-CM | POA: Diagnosis not present

## 2018-06-30 DIAGNOSIS — Z85820 Personal history of malignant melanoma of skin: Secondary | ICD-10-CM | POA: Diagnosis not present

## 2018-06-30 DIAGNOSIS — L821 Other seborrheic keratosis: Secondary | ICD-10-CM | POA: Diagnosis not present

## 2018-06-30 DIAGNOSIS — L814 Other melanin hyperpigmentation: Secondary | ICD-10-CM | POA: Diagnosis not present

## 2018-06-30 DIAGNOSIS — D1801 Hemangioma of skin and subcutaneous tissue: Secondary | ICD-10-CM | POA: Diagnosis not present

## 2018-07-03 ENCOUNTER — Encounter: Payer: Self-pay | Admitting: Family Medicine

## 2018-07-05 ENCOUNTER — Encounter: Payer: Self-pay | Admitting: Family Medicine

## 2018-07-05 NOTE — Telephone Encounter (Signed)
You need to see her first right?  I will call to see if their office have a clearance form.

## 2018-07-05 NOTE — Telephone Encounter (Signed)
Dr Carollee Herter -- I received a call from the [facility] that they were advising pt to schedule appt with you for surgical clearance and pt is telling them she does not need surgical clearance. Just needs a letter signed regarding her medication. Pt states she just had her cpe in June and doesn't see why she needs another office visit. I do not see an EKG since 2012 in [place].  See pt's message above and advise?

## 2018-07-06 ENCOUNTER — Telehealth: Payer: Self-pay | Admitting: *Deleted

## 2018-07-06 NOTE — Telephone Encounter (Signed)
Copied from [place] [id number]. Topic: General - Other >> Jul 05, 2018  3:08 PM Carolyn Stare wrote:  Pt req a call back said she is needing to come off a med for surgery and also need surgical clearance. I told her that the doctor would need to fax that over about coming off the medication. She said have the nurse call me >> Jul 05, 2018  3:33 PM Oneta Rack wrote: Osvaldo Human name: Relation to pt: Call back number: Pharmacy:  Reason for call:  Checking on the status of detailed my chart message, advised patient PCP is currently in clinic and will respond via my chart.

## 2018-07-06 NOTE — Telephone Encounter (Signed)
Spoke with patient.  She has an appointment on Tuesday for surgical clearance.  Dr. Coralyn Mark did not give her an surgical clearance form. She gave me Dr. Jonathon Bellows nurses number Maurine Minister [phone]

## 2018-07-07 NOTE — Telephone Encounter (Signed)
Copied from [place] [phone]. Topic: General - Other >> Jul 07, 2018  8:45 AM Synthia Innocent wrote: Reason for CRM: [facility] just faxed surgical clearance. Just want to make sure we received.

## 2018-07-08 ENCOUNTER — Telehealth: Payer: Self-pay

## 2018-07-08 NOTE — Telephone Encounter (Signed)
Copied from [place]. Topic: General - Other >> Jul 08, 2018  1:17 PM Yvette Rack wrote: Reason for CRM: Sharyn Lull with [facility] called requesting faxed surgical clearance paperwork be completed and faxed back. Cb# [phone]

## 2018-07-08 NOTE — Telephone Encounter (Signed)
Received Medical/Surgical Clearance Form from [place]; forwarded to provider with last OV note attached/SLS 08/23

## 2018-07-12 ENCOUNTER — Ambulatory Visit (INDEPENDENT_AMBULATORY_CARE_PROVIDER_SITE_OTHER): Payer: Medicare Other | Admitting: Family Medicine

## 2018-07-12 ENCOUNTER — Encounter: Payer: Self-pay | Admitting: Family Medicine

## 2018-07-12 VITALS — BP 125/75 | HR 60 | Temp 98.3°F | Resp 16 | Ht 63.0 in | Wt 175.6 lb

## 2018-07-12 DIAGNOSIS — Z01818 Encounter for other preprocedural examination: Secondary | ICD-10-CM

## 2018-07-12 DIAGNOSIS — Z23 Encounter for immunization: Secondary | ICD-10-CM | POA: Diagnosis not present

## 2018-07-12 DIAGNOSIS — I10 Essential (primary) hypertension: Secondary | ICD-10-CM

## 2018-07-12 NOTE — Patient Instructions (Signed)
Laminectomy °Laminectomy is a surgery to remove: °· Small pieces of bone in the spine (lamina). °· Tough, cord-like tissues that connect bones to other bones (ligaments) underneath the lamina. These ligaments connect the bones in the spine (vertebrae). °· Parts of joints in the spine (facet joints) that have grown too large. ° °The goals of this surgery are: °· To reduce pressure on nerves and the spinal cord. °· To reduce pain, numbness, and discomfort. ° °You may need this procedure if you have narrowing of the spinal canal (spinal stenosis) or if you have a spinal tumor, spinal injury, or Paget disease of bone. °Tell a health care provider about: °· Any allergies you have. °· All medicines you are taking, including vitamins, herbs, eye drops, creams, and over-the-counter medicines. °· Any problems you or family members have had with anesthetic medicines. °· Any blood disorders you have. °· Any surgeries you have had. °· Any medical conditions you have, including a cold or any infections. °· Whether you are pregnant or may be pregnant. °What are the risks? °Generally, this is a safe procedure. However, problems may occur, including: °· Infection. °· Bleeding. °· Allergic reactions to medicines or dyes. °· Damage to other structures or organs, such as nerves. Nerve damage can cause pain, weakness, or numbness. °· Leaking of spinal fluid. °· A blood clot in a leg. The clot can move to the lungs, which can be very serious. °· Inability to control when you urinate (urinary incontinence) or when you have bowel movements (fecal incontinence). This is rare. ° °What happens before the procedure? °Staying hydrated °Follow instructions from your health care provider about hydration, which may include: °· Up to 2 hours before the procedure - you may continue to drink clear liquids, such as water, clear fruit juice, black coffee, and plain tea. ° °Eating and drinking restrictions °Follow instructions from your health care  provider about eating and drinking, which may include: °· 24 hours before the procedure - stop drinking alcohol. °· 8 hours before the procedure - stop eating heavy meals or foods such as meat, fried foods, or fatty foods. °· 6 hours before the procedure - stop eating light meals or foods, such as toast or cereal. °· 6 hours before the procedure - stop drinking milk or drinks that contain milk. °· 2 hours before the procedure - stop drinking clear liquids. ° °Medicines °· Ask your health care provider about: °? Changing or stopping your regular medicines. This is especially important if you are taking diabetes medicines or blood thinners. If your health care provider asks you to keep taking some medicines, take them with a sip of water. °? Taking medicines such as aspirin and ibuprofen. These medicines can thin your blood. Do not take these medicines before your procedure if your health care provider instructs you not to. °· You may be given antibiotic medicine to help prevent infection. °General instructions °· Do not use any products that contain nicotine or tobacco, such as cigarettes and e-cigarettes, for at least 2 weeks before the procedure. If you need help quitting, ask your health care provider. °· Ask your health care provider how your surgical site will be marked or identified. °· You may have tests done, such as blood tests or an electrocardiogram (ECG). °· If you will be going home right after the procedure, plan to have someone with you for 24 hours. °· Plan to have someone: °? Take you home from the hospital or clinic. °? Help you   at home for the first week after the procedure. °What happens during the procedure? °· To reduce your risk of infection, your health care team will wash or sanitize their hands. °· Small monitors will be placed on your body to check your heart rate, blood pressure, and oxygen level. °· An IV tube will be inserted into one of your veins. °· You will be given a medicine to  make you fall asleep (general anesthetic). You may also be given a medicine to help you relax (sedative). °· A breathing tube will be placed into your lungs. °· Your back will be cleaned with a germ-killing solution. °· An incision will be made in your back. The incision may be 2-5 inches (5-13 cm) long, depending on how many vertebrae are being operated on. °· Muscles in your back will be moved away from the vertebrae and pulled to the side. °· Pieces of lamina will be removed. °· Ligaments and thickened facet joints will be removed. How much tissue and bone is removed depends on how much of the tissue is putting pressure on your nerves. °· Your nerves will be identified and evaluated to check for excessive tightness. °· Your back muscles will be moved back into their normal position. °· The area under your skin will be closed with small, dissolvable stitches (sutures). °· Your skin will be closed with small, absorbable sutures or staples. °· A bandage (dressing) will be placed over your incision. °The procedure may vary among health care providers and hospitals. °What happens after the procedure? °· Your blood pressure, heart rate, breathing rate, and blood oxygen level will be monitored until the medicines you were given have worn off. °· You may continue receive medicines and fluids through an IV tube. °· You will have some back pain. You will be given pain medicine as needed. °· It is important to be up and moving as soon as possible after this procedure. Physical therapists will help you start walking. °· To prevent blood clots in your legs: °? You may have to wear compression stockings. These stockings also reduce swelling in your legs. °? You may need to take medicines. °· You may need to do breathing exercises to help prevent lung infection. °· Do not drive for 24 hours if you received a sedative. °Summary °· Laminectomy is a procedure that is done to reduce pressure on nerves and the spinal cord and to  reduce pain, numbness, and discomfort. °· If you will be going home right after the procedure, plan to have someone with you for 24 hours. °· You will have some back pain. You will be given pain medicine as needed. °· It is important to be up and moving as soon as possible after this procedure. Physical therapists will help you start walking. °This information is not intended to replace advice given to you by your health care provider. Make sure you discuss any questions you have with your health care provider. °Document Released: 10/21/2009 Document Revised: 06/18/2016 Document Reviewed: 04/19/2016 °Elsevier Interactive Patient Education © 2018 [employer] ° °

## 2018-07-12 NOTE — Telephone Encounter (Signed)
Clearance form faxed today to [facility]

## 2018-07-12 NOTE — Progress Notes (Signed)
Subjective:    Wendy Velazquez is a 77 y.o. female who presents to the office today for a preoperative consultation at the request of surgeon Dr Hale Drone who plans on performing open right lumbar 4-5 hemilaminectomy and foraminotomy on September 17. This consultation is requested for the specific conditions prompting preoperative evaluation (i.e. because of potential affect on operative risk): htn, age.   Planned anesthesia: general. The patient has the following known anesthesia issues: nausea. Patients bleeding risk: use of Ca-channel blockers (see med list). Patient does not have objections to receiving blood products if needed.  The following portions of the patient's history were reviewed and updated as appropriate:  She  has a past medical history of Arthritis, Hypertension, Melanoma ([place]), MRSA (methicillin resistant staph aureus) culture positive, Osteopenia, Rapid heart beat, and Transitional cell carcinoma ([place]). She does not have any pertinent problems on file. She  has a past surgical history that includes Tonsillectomy; Abdominal hysterectomy (1987); Melanoma excision (1977); Melanoma excision (06-24-09); Cataract extraction (2004); Skin cancer excision (1999); Eye surgery; and teeth implants. Her family history includes Alzheimer's disease in her mother; Dementia in her mother; Diabetes in her mother; Hypertension in her father; Stroke in her father. She  reports that she quit smoking about 37 years ago. She has a 4.50 pack-year smoking history. She has never used smokeless tobacco. She reports that she drinks about 1.0 - 2.0 standard drinks of alcohol per week. She reports that she does not use drugs. She has a current medication list which includes the following prescription(s): acetaminophen, amlodipine, digestive enzymes, estradiol, fluticasone, glucos-msm-c-mn-ginger-willow, guaifenesin, hydrocodone-homatropine, medroxyprogesterone, meloxicam, propranolol, ranitidine, synthroid, and  spironolactone. Current Outpatient Medications on File Prior to Visit  Medication Sig Dispense Refill  . acetaminophen (TYLENOL ARTHRITIS PAIN) 650 MG CR tablet Take 650 mg by mouth at bedtime.      Marland Kitchen amLODipine (NORVASC) 5 MG tablet Take 1 tablet (5 mg total) by mouth daily. 90 tablet 3  . Digestive Enzymes (ENZYMATIC DIGESTANT PO) Take by mouth. Enzymatic Therapy probiotic pearls    . estradiol (ESTRACE) 0.5 MG tablet Take 1 tablet (0.5 mg total) by mouth daily. 90 tablet 1  . fluticasone (FLONASE) 50 MCG/ACT nasal spray Place 2 sprays into both nostrils daily. 16 g 1  . Glucos-MSM-C-Mn-Ginger-Willow (GLUCOSAMINE MSM COMPLEX PO) Take 1 tablet by mouth daily.    . GuaiFENesin (MUCINEX PO) Take 200 mg by mouth at bedtime.    Marland Kitchen HYDROcodone-homatropine (HYCODAN) 5-1.5 MG/5ML syrup Take 5 mLs by mouth every 8 (eight) hours as needed for cough. 120 mL 0  . medroxyPROGESTERone (PROVERA) 5 MG tablet 1 tab po qd 10 days a month as directed.  Brand name necessary. 90 tablet 3  . meloxicam (MOBIC) 15 MG tablet 1 po qd prn 90 tablet 1  . propranolol (INDERAL) 10 MG tablet Take 1 tablet (10 mg total) by mouth 2 (two) times daily. 180 tablet 3  . ranitidine (ZANTAC 75) 75 MG tablet Take 75 mg by mouth daily.      Marland Kitchen SYNTHROID 75 MCG tablet Take 1 tablet (75 mcg total) by mouth daily. 90 tablet 3   No current facility-administered medications on file prior to visit.    She is allergic to antihistamines, diphenhydramine-type and codeine..  Review of Systems Review of Systems  Constitutional: Negative for activity change, appetite change and fatigue.  HENT: Negative for hearing loss, congestion, tinnitus and ear discharge.  dentist q78m Eyes: Negative for visual disturbance (see optho q1y -- vision  corrected to 20/20 with glasses).  Respiratory: Negative for cough, chest tightness and shortness of breath.   Cardiovascular: Negative for chest pain, palpitations and leg swelling.  Gastrointestinal: Negative  for abdominal pain, diarrhea, constipation and abdominal distention.  Genitourinary: Negative for urgency, frequency, decreased urine volume and difficulty urinating.  Musculoskeletal: +back pain Skin: Negative for color change, pallor and rash.  Neurological: Negative for dizziness, light-headedness, numbness and headaches.  Hematological: Negative for adenopathy. Does not bruise/bleed easily.  Psychiatric/Behavioral: Negative for suicidal ideas, confusion, sleep disturbance, self-injury, dysphoric mood, decreased concentration and agitation.        Objective:    BP 125/75   Pulse 60   Temp 98.3 F (36.8 C) (Oral)   Resp 16   Ht 5\' 3"  (1.6 m)   Wt 175 lb 9.6 oz (79.7 kg)   SpO2 98%   BMI 31.11 kg/m  General appearance: alert, cooperative, appears stated age and no distress Head: Normocephalic, without obvious abnormality, atraumatic Eyes: negative findings: lids and lashes normal, conjunctivae and sclerae normal and pupils equal, round, reactive to light and accomodation Ears: normal TM's and external ear canals both ears Nose: Nares normal. Septum midline. Mucosa normal. No drainage or sinus tenderness. Throat: lips, mucosa, and tongue normal; teeth and gums normal Neck: no adenopathy, no carotid bruit, no JVD, supple, symmetrical, trachea midline and thyroid not enlarged, symmetric, no tenderness/mass/nodules Back: symmetric, no curvature. ROM normal. No CVA tenderness. Lungs: clear to auscultation bilaterally Heart: regular rate and rhythm, S1, S2 normal, no murmur, click, rub or gallop Abdomen: soft, non-tender; bowel sounds normal; no masses,  no organomegaly Extremities: extremities normal, atraumatic, no cyanosis or edema Pulses: 2+ and symmetric Skin: Skin color, texture, turgor normal. No rashes or lesions Lymph nodes: Cervical, supraclavicular, and axillary nodes normal. Neurologic: Alert and oriented X 3, normal strength and tone. Normal symmetric reflexes. Normal  coordination and gait  Cardiographics ECG: RBBB Echocardiogram: not done  Imaging Chest x-ray: not done   Lab Review  Office Visit on 05/05/2018  Component Date Value  . Sodium 05/05/2018 142   . Potassium 05/05/2018 4.2   . Chloride 05/05/2018 103   . CO2 05/05/2018 30   . Glucose, Bld 05/05/2018 109*  . BUN 05/05/2018 22   . Creatinine, Ser 05/05/2018 1.06   . Total Bilirubin 05/05/2018 1.0   . Alkaline Phosphatase 05/05/2018 83   . AST 05/05/2018 15   . ALT 05/05/2018 14   . Total Protein 05/05/2018 6.6   . Albumin 05/05/2018 4.5   . Calcium 05/05/2018 9.9   . GFR 05/05/2018 53.58*  . Cholesterol 05/05/2018 182   . Triglycerides 05/05/2018 186.0*  . HDL 05/05/2018 46.30   . VLDL 05/05/2018 37.2   . LDL Cholesterol 05/05/2018 99   . Total CHOL/HDL Ratio 05/05/2018 4   . NonHDL 05/05/2018 135.91   . TSH 05/05/2018 1.24       Assessment:      77 y.o. female with planned surgery as above.   Known risk factors for perioperative complications: None   Difficulty with intubation is not anticipated.  Cardiac Risk Estimation: low   Plan:    1. Preoperative workup as follows ECG, see labs . 2. Change in medication regimen before surgery: stop mobic 7 days prior to surgery. 3. Prophylaxis for cardiac events with perioperative beta-blockers: not indicated. 4. Deep vein thrombosis prophylaxis postoperatively:regimen to be chosen by surgical team. 5.. Other measures: consult in pt hospitalist if needed

## 2018-07-12 NOTE — Telephone Encounter (Signed)
See other phone note

## 2018-07-13 MED ORDER — SPIRONOLACTONE 25 MG PO TABS: ORAL_TABLET | ORAL | 3 refills | Status: DC

## 2018-07-13 NOTE — Telephone Encounter (Signed)
Aldactone refill request received via fax. Refilled per protocol.

## 2018-07-15 DIAGNOSIS — D485 Neoplasm of uncertain behavior of skin: Secondary | ICD-10-CM | POA: Diagnosis not present

## 2018-07-15 DIAGNOSIS — Z85820 Personal history of malignant melanoma of skin: Secondary | ICD-10-CM | POA: Diagnosis not present

## 2018-07-15 DIAGNOSIS — L820 Inflamed seborrheic keratosis: Secondary | ICD-10-CM | POA: Diagnosis not present

## 2018-08-02 DIAGNOSIS — Z79899 Other long term (current) drug therapy: Secondary | ICD-10-CM | POA: Diagnosis not present

## 2018-08-02 DIAGNOSIS — M4156 Other secondary scoliosis, lumbar region: Secondary | ICD-10-CM | POA: Diagnosis not present

## 2018-08-02 DIAGNOSIS — M5416 Radiculopathy, lumbar region: Secondary | ICD-10-CM | POA: Diagnosis not present

## 2018-08-02 DIAGNOSIS — M1990 Unspecified osteoarthritis, unspecified site: Secondary | ICD-10-CM | POA: Diagnosis not present

## 2018-08-02 DIAGNOSIS — Z9109 Other allergy status, other than to drugs and biological substances: Secondary | ICD-10-CM | POA: Diagnosis not present

## 2018-08-02 DIAGNOSIS — Z885 Allergy status to narcotic agent status: Secondary | ICD-10-CM | POA: Diagnosis not present

## 2018-08-02 DIAGNOSIS — M48061 Spinal stenosis, lumbar region without neurogenic claudication: Secondary | ICD-10-CM | POA: Diagnosis not present

## 2018-08-02 DIAGNOSIS — Z87891 Personal history of nicotine dependence: Secondary | ICD-10-CM | POA: Diagnosis not present

## 2018-08-02 DIAGNOSIS — E039 Hypothyroidism, unspecified: Secondary | ICD-10-CM | POA: Diagnosis not present

## 2018-08-02 DIAGNOSIS — I10 Essential (primary) hypertension: Secondary | ICD-10-CM | POA: Diagnosis not present

## 2018-08-03 DIAGNOSIS — M4156 Other secondary scoliosis, lumbar region: Secondary | ICD-10-CM | POA: Diagnosis not present

## 2018-08-03 DIAGNOSIS — M48061 Spinal stenosis, lumbar region without neurogenic claudication: Secondary | ICD-10-CM | POA: Diagnosis not present

## 2018-08-03 DIAGNOSIS — M1990 Unspecified osteoarthritis, unspecified site: Secondary | ICD-10-CM | POA: Diagnosis not present

## 2018-08-03 DIAGNOSIS — E039 Hypothyroidism, unspecified: Secondary | ICD-10-CM | POA: Diagnosis not present

## 2018-08-03 DIAGNOSIS — I10 Essential (primary) hypertension: Secondary | ICD-10-CM | POA: Diagnosis not present

## 2018-08-03 DIAGNOSIS — M5416 Radiculopathy, lumbar region: Secondary | ICD-10-CM | POA: Diagnosis not present

## 2018-08-05 ENCOUNTER — Encounter: Payer: Self-pay | Admitting: Family Medicine

## 2018-08-16 ENCOUNTER — Encounter: Payer: Self-pay | Admitting: Family Medicine

## 2018-08-22 NOTE — Telephone Encounter (Signed)
Everybody is different-- sometimes it takes time ---usually when they take the pressure off the nerve the pain is better.  Rest -- try ice and / or heat Definitely f/u with the surgeon or the pa when you can

## 2018-09-04 ENCOUNTER — Encounter: Payer: Self-pay | Admitting: Family Medicine

## 2018-09-05 NOTE — Telephone Encounter (Signed)
pepcid or omeprazole

## 2018-09-12 ENCOUNTER — Other Ambulatory Visit (HOSPITAL_COMMUNITY): Payer: Self-pay | Admitting: Neurological Surgery

## 2018-09-12 ENCOUNTER — Encounter: Payer: Self-pay | Admitting: Family Medicine

## 2018-09-12 DIAGNOSIS — G8929 Other chronic pain: Secondary | ICD-10-CM

## 2018-09-12 DIAGNOSIS — M5442 Lumbago with sciatica, left side: Principal | ICD-10-CM

## 2018-09-12 NOTE — Telephone Encounter (Signed)
She needs to wait to be released from the surgeon   She really needs to f/u there if she is having so much trouble

## 2018-09-13 ENCOUNTER — Ambulatory Visit (INDEPENDENT_AMBULATORY_CARE_PROVIDER_SITE_OTHER): Payer: Medicare Other | Admitting: Family Medicine

## 2018-09-13 ENCOUNTER — Encounter: Payer: Self-pay | Admitting: Family Medicine

## 2018-09-13 VITALS — BP 135/70 | HR 61 | Temp 98.2°F | Resp 16 | Ht 63.0 in | Wt 175.0 lb

## 2018-09-13 DIAGNOSIS — M5441 Lumbago with sciatica, right side: Secondary | ICD-10-CM

## 2018-09-13 DIAGNOSIS — G8929 Other chronic pain: Secondary | ICD-10-CM | POA: Diagnosis not present

## 2018-09-13 NOTE — Patient Instructions (Signed)

## 2018-09-13 NOTE — Progress Notes (Signed)
Patient ID: Wendy Velazquez, female    DOB: December 20, 1940  Age: 77 y.o. MRN: [id number]    Subjective:  Subjective  HPI [name] presents to discuss her recent back surgery and increase in pain.  She saw the surgeon --- she ordered PT, MRI and gave her a pred taper and muscle relaxer.  She is frustrated with the communication with [facility] --- MRI will be done a [facility] next week  She will go to [facility] in [place] for PT and is interested in dry needling  Review of Systems  Constitutional: Negative for appetite change, diaphoresis, fatigue and unexpected weight change.  Eyes: Negative for pain, redness and visual disturbance.  Respiratory: Negative for cough, chest tightness, shortness of breath and wheezing.   Cardiovascular: Negative for chest pain, palpitations and leg swelling.  Endocrine: Negative for cold intolerance, heat intolerance, polydipsia, polyphagia and polyuria.  Genitourinary: Negative for difficulty urinating, dysuria and frequency.  Neurological: Negative for dizziness, light-headedness, numbness and headaches.    History Past Medical History:  Diagnosis Date  . Arthritis   . Hypertension   . Melanoma ([place])   . MRSA (methicillin resistant staph aureus) culture positive   . Osteopenia   . Rapid heart beat    [facility]  . Transitional cell carcinoma ([place])     She has a past surgical history that includes Tonsillectomy; Abdominal hysterectomy (1987); Melanoma excision (1977); Melanoma excision (06-24-09); Cataract extraction (2004); Skin cancer excision (1999); Eye surgery; and teeth implants.   Her family history includes Alzheimer's disease in her mother; Dementia in her mother; Diabetes in her mother; Hypertension in her father; Stroke in her father.She reports that she quit smoking about 37 years ago. She has a 4.50 pack-year smoking history. She has never used smokeless tobacco. She reports that she drinks about 1.0 - 2.0 standard drinks of alcohol per week. She reports  that she does not use drugs.  Current Outpatient Medications on File Prior to Visit  Medication Sig Dispense Refill  . acetaminophen (TYLENOL ARTHRITIS PAIN) 650 MG CR tablet Take 650 mg by mouth 3 (three) times daily as needed.     Marland Kitchen amLODipine (NORVASC) 5 MG tablet Take 1 tablet (5 mg total) by mouth daily. 90 tablet 3  . Digestive Enzymes (ENZYMATIC DIGESTANT PO) Take by mouth. Enzymatic Therapy probiotic pearls    . estradiol (ESTRACE) 0.5 MG tablet Take 1 tablet (0.5 mg total) by mouth daily. 90 tablet 1  . fluticasone (FLONASE) 50 MCG/ACT nasal spray Place 2 sprays into both nostrils daily. 16 g 1  . Glucos-MSM-C-Mn-Ginger-Willow (GLUCOSAMINE MSM COMPLEX PO) Take 1 tablet by mouth daily.    . GuaiFENesin (MUCINEX PO) Take 200 mg by mouth at bedtime.    Marland Kitchen HYDROcodone-homatropine (HYCODAN) 5-1.5 MG/5ML syrup Take 5 mLs by mouth every 8 (eight) hours as needed for cough. 120 mL 0  . medroxyPROGESTERone (PROVERA) 5 MG tablet 1 tab po qd 10 days a month as directed.  Brand name necessary. 90 tablet 3  . meloxicam (MOBIC) 15 MG tablet 1 po qd prn 90 tablet 1  . methocarbamol (ROBAXIN) 500 MG tablet Take 0.5 tablets by mouth at bedtime.    . methylPREDNISolone (MEDROL DOSEPAK) 4 MG TBPK tablet Follow package directions.    . propranolol (INDERAL) 10 MG tablet Take 1 tablet (10 mg total) by mouth 2 (two) times daily. 180 tablet 3  . ranitidine (ZANTAC 75) 75 MG tablet Take 75 mg by mouth daily.      Marland Kitchen  spironolactone (ALDACTONE) 25 MG tablet 1/2 tab po qd 45 tablet 3  . SYNTHROID 75 MCG tablet Take 1 tablet (75 mcg total) by mouth daily. 90 tablet 3   No current facility-administered medications on file prior to visit.      Objective:  Objective  Physical Exam  Constitutional: She is oriented to person, place, and time. She appears well-developed and well-nourished.  HENT:  Head: Normocephalic and atraumatic.  Eyes: Conjunctivae and EOM are normal.  Neck: Normal range of motion. Neck  supple. No JVD present. Carotid bruit is not present. No thyromegaly present.  Cardiovascular: Normal rate, regular rhythm and normal heart sounds.  No murmur heard. Pulmonary/Chest: Effort normal and breath sounds normal. No respiratory distress. She has no wheezes. She has no rales. She exhibits no tenderness.  Musculoskeletal: She exhibits tenderness. She exhibits no edema.  con't back pain with radiation down R leg  Neurological: She is alert and oriented to person, place, and time.  Psychiatric: She has a normal mood and affect.  Nursing note and vitals reviewed.  BP 135/70 (BP Location: Right Arm, Cuff Size: Normal)   Pulse 61   Temp 98.2 F (36.8 C) (Oral)   Resp 16   Ht 5\' 3"  (1.6 m)   Wt 175 lb (79.4 kg)   SpO2 99%   BMI 31.00 kg/m  Wt Readings from Last 3 Encounters:  09/13/18 175 lb (79.4 kg)  07/12/18 175 lb 9.6 oz (79.7 kg)  05/05/18 177 lb (80.3 kg)     Lab Results  Component Value Date   WBC 6.9 05/04/2017   HGB 14.6 05/04/2017   HCT 43.4 05/04/2017   PLT 204.0 05/04/2017   GLUCOSE 109 (H) 05/05/2018   CHOL 182 05/05/2018   TRIG 186.0 (H) 05/05/2018   HDL 46.30 05/05/2018   LDLDIRECT 102.0 04/18/2015   LDLCALC 99 05/05/2018   ALT 14 05/05/2018   AST 15 05/05/2018   NA 142 05/05/2018   K 4.2 05/05/2018   CL 103 05/05/2018   CREATININE 1.06 05/05/2018   BUN 22 05/05/2018   CO2 30 05/05/2018   TSH 1.24 05/05/2018   MICROALBUR 2.0 (H) 04/18/2015    Mr Lumbar Spine Wo Contrast  Result Date: 12/03/2017 CLINICAL DATA:  Back pain with right leg pain EXAM: MRI LUMBAR SPINE WITHOUT CONTRAST TECHNIQUE: Multiplanar, multisequence MR imaging of the lumbar spine was performed. No intravenous contrast was administered. COMPARISON:  None. FINDINGS: Segmentation:  Normal.  Lowest disc space L5-S1 Alignment: Mild dextroscoliosis at L2-3. Normal sagittal alignment. Vertebrae: Negative for fracture or mass. Hemangioma L1 vertebral body. Conus medullaris and cauda  equina: Conus extends to the L1-2 level. Conus and cauda equina appear normal. Paraspinal and other soft tissues: Negative Disc levels: T12-L1: Negative L1-2: Mild disc and mild facet degeneration L2-3: Disc degeneration left greater than right. Left-sided endplate spurring and facet hypertrophy causing moderate subarticular stenosis on the left. Possible impingement left L3 nerve root. Spinal canal adequate in size. L3-4: Mild disc degeneration and facet degeneration. Mild subarticular stenosis on the right. L4-5: Severe facet degeneration. Mild disc bulging. Severe subarticular foraminal stenosis on the right with compression of the right L4 and L5 nerve roots. Left foramen patent L5-S1: Mild facet degeneration and mild right foraminal narrowing. IMPRESSION: Moderate subarticular stenosis on the left at L2-3 Mild subarticular stenosis on the right L3-4 Severe subarticular foraminal encroachment on the right with impingement of right L4 and L5 nerve roots. Mild right foraminal narrowing L5-S1. Electronically Signed  By: Franchot Gallo M.D.   On: 12/03/2017 17:20     Assessment & Plan:  Plan  I am having Arianne A. Kinne maintain her acetaminophen, ranitidine, Digestive Enzymes (ENZYMATIC DIGESTANT PO), Glucos-MSM-C-Mn-Ginger-Willow (GLUCOSAMINE MSM COMPLEX PO), fluticasone, HYDROcodone-homatropine, GuaiFENesin (MUCINEX PO), amLODipine, SYNTHROID, meloxicam, propranolol, medroxyPROGESTERone, estradiol, spironolactone, methocarbamol, and methylPREDNISolone.  No orders of the defined types were placed in this encounter.   Problem List Items Addressed This Visit      Unprioritized   Chronic right-sided low back pain with right-sided sciatica - Primary   Relevant Medications   methocarbamol (ROBAXIN) 500 MG tablet   methylPREDNISolone (MEDROL DOSEPAK) 4 MG TBPK tablet    surgeon ordered pt and mri Encouraged her to go to Pt close to home F/u with surgeon   Follow-up: Return in about 6 months  (around 03/15/2019) for hypertension.  Ann Held, DO

## 2018-09-14 NOTE — Telephone Encounter (Signed)
Patient seen in office on 09/13/18

## 2018-09-22 ENCOUNTER — Ambulatory Visit (HOSPITAL_COMMUNITY)
Admission: RE | Admit: 2018-09-22 | Discharge: 2018-09-22 | Disposition: A | Discharge status: Home / Self Care | Payer: Medicare Other | Source: Ambulatory Visit | Attending: Neurological Surgery | Admitting: Neurological Surgery

## 2018-09-22 DIAGNOSIS — M5442 Lumbago with sciatica, left side: Secondary | ICD-10-CM | POA: Insufficient documentation

## 2018-09-22 DIAGNOSIS — G8929 Other chronic pain: Secondary | ICD-10-CM | POA: Diagnosis not present

## 2018-09-22 DIAGNOSIS — M48061 Spinal stenosis, lumbar region without neurogenic claudication: Secondary | ICD-10-CM | POA: Diagnosis not present

## 2018-10-04 DIAGNOSIS — M545 Low back pain: Secondary | ICD-10-CM | POA: Diagnosis not present

## 2018-10-07 ENCOUNTER — Ambulatory Visit (INDEPENDENT_AMBULATORY_CARE_PROVIDER_SITE_OTHER): Payer: Medicare Other | Admitting: Family Medicine

## 2018-10-07 ENCOUNTER — Encounter

## 2018-10-07 VITALS — BP 134/61 | HR 70 | Temp 98.2°F | Resp 16 | Ht 63.0 in | Wt 175.8 lb

## 2018-10-07 DIAGNOSIS — M48061 Spinal stenosis, lumbar region without neurogenic claudication: Secondary | ICD-10-CM

## 2018-10-07 NOTE — Progress Notes (Signed)
Patient ID: Wendy Velazquez, female    DOB: [birth year]  Age: 77 y.o. MRN: [id number]    Subjective:  Subjective  HPI Wendy Velazquez presents for f/u back surgery she is still having pain and would like to see another neurosurgeon.  She had a repeat MRI with NS at [place]  She has not been happy with that office and would like to switch drs.    Review of Systems  Constitutional: Negative for activity change, appetite change, fatigue and unexpected weight change.  Respiratory: Negative for cough and shortness of breath.   Cardiovascular: Negative for chest pain and palpitations.  Musculoskeletal: Positive for back pain and gait problem.  Psychiatric/Behavioral: Negative for behavioral problems and dysphoric mood. The patient is not nervous/anxious.     History Past Medical History:  Diagnosis Date  . Arthritis   . Hypertension   . Melanoma ([place])   . MRSA (methicillin resistant staph aureus) culture positive   . Osteopenia   . Rapid heart beat    benigh  . Transitional cell carcinoma ([place])     She has a past surgical history that includes Tonsillectomy; Abdominal hysterectomy (1987); Melanoma excision (1977); Melanoma excision (06-24-09); Cataract extraction (2004); Skin cancer excision (1999); Eye surgery; and teeth implants.   Her family history includes Alzheimer's disease in her mother; Dementia in her mother; Diabetes in her mother; Hypertension in her father; Stroke in her father.She reports that she quit smoking about 37 years ago. She has a 4.50 pack-year smoking history. She has never used smokeless tobacco. She reports that she drinks about 1.0 - 2.0 standard drinks of alcohol per week. She reports that she does not use drugs.  Current Outpatient Medications on File Prior to Visit  Medication Sig Dispense Refill  . acetaminophen (TYLENOL ARTHRITIS PAIN) 650 MG CR tablet Take 650 mg by mouth 3 (three) times daily as needed.     Marland Kitchen amLODipine (NORVASC) 5 MG tablet Take 1 tablet  (5 mg total) by mouth daily. 90 tablet 3  . Digestive Enzymes (ENZYMATIC DIGESTANT PO) Take by mouth. Enzymatic Therapy probiotic pearls    . estradiol (ESTRACE) 0.5 MG tablet Take 1 tablet (0.5 mg total) by mouth daily. 90 tablet 1  . fluticasone (FLONASE) 50 MCG/ACT nasal spray Place 2 sprays into both nostrils daily. 16 g 1  . Glucos-MSM-C-Mn-Ginger-Willow (GLUCOSAMINE MSM COMPLEX PO) Take 1 tablet by mouth daily.    . GuaiFENesin (MUCINEX PO) Take 200 mg by mouth at bedtime.    Marland Kitchen HYDROcodone-homatropine (HYCODAN) 5-1.5 MG/5ML syrup Take 5 mLs by mouth every 8 (eight) hours as needed for cough. 120 mL 0  . medroxyPROGESTERone (PROVERA) 5 MG tablet 1 tab po qd 10 days a month as directed.  Brand name necessary. 90 tablet 3  . meloxicam (MOBIC) 15 MG tablet 1 po qd prn 90 tablet 1  . methocarbamol (ROBAXIN) 500 MG tablet Take 0.5 tablets by mouth at bedtime.    Marland Kitchen omeprazole (PRILOSEC) 20 MG capsule Take 1 capsule by mouth daily.    . propranolol (INDERAL) 10 MG tablet Take 1 tablet (10 mg total) by mouth 2 (two) times daily. 180 tablet 3  . spironolactone (ALDACTONE) 25 MG tablet 1/2 tab po qd 45 tablet 3  . SYNTHROID 75 MCG tablet Take 1 tablet (75 mcg total) by mouth daily. 90 tablet 3   No current facility-administered medications on file prior to visit.      Objective:  Objective  Physical Exam  Constitutional: She  is oriented to person, place, and time. She appears well-developed and well-nourished.  HENT:  Head: Normocephalic and atraumatic.  Eyes: Conjunctivae and EOM are normal.  Neck: Normal range of motion. Neck supple. No JVD present. Carotid bruit is not present. No thyromegaly present.  Cardiovascular: Normal rate, regular rhythm and normal heart sounds.  No murmur heard. Pulmonary/Chest: Effort normal and breath sounds normal. No respiratory distress. She has no wheezes. She has no rales. She exhibits no tenderness.  Musculoskeletal: She exhibits no edema.  Neurological:  She is alert and oriented to person, place, and time.  Psychiatric: She has a normal mood and affect.  Nursing note and vitals reviewed.  BP 134/61 (BP Location: Right Arm, Cuff Size: Large)   Pulse 70   Temp 98.2 F (36.8 C) (Oral)   Resp 16   Ht 5\' 3"  (1.6 m)   Wt 175 lb 12.8 oz (79.7 kg)   SpO2 99%   BMI 31.14 kg/m  Wt Readings from Last 3 Encounters:  10/07/18 175 lb 12.8 oz (79.7 kg)  09/13/18 175 lb (79.4 kg)  07/12/18 175 lb 9.6 oz (79.7 kg)     Lab Results  Component Value Date   WBC 6.9 05/04/2017   HGB 14.6 05/04/2017   HCT 43.4 05/04/2017   PLT 204.0 05/04/2017   GLUCOSE 109 (H) 05/05/2018   CHOL 182 05/05/2018   TRIG 186.0 (H) 05/05/2018   HDL 46.30 05/05/2018   LDLDIRECT 102.0 04/18/2015   LDLCALC 99 05/05/2018   ALT 14 05/05/2018   AST 15 05/05/2018   NA 142 05/05/2018   K 4.2 05/05/2018   CL 103 05/05/2018   CREATININE 1.06 05/05/2018   BUN 22 05/05/2018   CO2 30 05/05/2018   TSH 1.24 05/05/2018   MICROALBUR 2.0 (H) 04/18/2015    Mr Lumbar Spine Wo Contrast  Result Date: 09/22/2018 CLINICAL DATA:  Low back pain radiating to the right leg EXAM: MRI LUMBAR SPINE WITHOUT CONTRAST TECHNIQUE: Multiplanar, multisequence MR imaging of the lumbar spine was performed. No intravenous contrast was administered. COMPARISON:  Lumbar spine MRI 12/03/2017 FINDINGS: Segmentation: Normal. The lowest disc space is considered to be L5-S1. Alignment:  There is dextroscoliosis with apex at L2. Vertebrae: Heterogeneous bone marrow signal. No focal lesion. No compression fracture or discitis-osteomyelitis. There are postsurgical changes at the L4 level. Conus medullaris and cauda equina: The conus medullaris terminates at the L1 level. The cauda equina and conus medullaris are both normal. Paraspinal and other soft tissues: There is postsurgical change at the L4 level with a fluid collection at the right laminectomy site that measures 18 x 10 mm. Disc levels: Sagittal plane  imaging includes the T11-12 disc level through the upper sacrum, with axial imaging of the T12-L1 to L5-S1 disc levels. T11-12: Disc space narrowing without spinal canal stenosis. Facet hypertrophy contributes to mild bilateral foraminal narrowing. These findings are unchanged. T12-L1: Normal. L1-2: Disc desiccation without herniation. Normal facets. No spinal canal stenosis. No neural foraminal stenosis. Unchanged. L2-3: Disc space narrowing with degenerative endplate signal changes. Left eccentric bulge is unchanged, narrowing the left lateral recess. No central spinal canal stenosis. Mild left foraminal stenosis is unchanged. L3-4: Disc desiccation and mild facet hypertrophy. Right-greater-than-left lateral recess narrowing, unchanged. No central spinal canal stenosis. Mild bilateral foraminal stenosis, unchanged. L4-5: Now status post right laminectomy. 18 x 10 mm collection at the laminectomy site provides mild mass effect on the right dorsal aspect of the thecal sac. Right lateral recess stenosis has been  relieved. There is no spinal canal stenosis. Severe right foraminal stenosis is unchanged. L5-S1: Remote right hemilaminectomy. No spinal canal stenosis. No neural foraminal stenosis. The visualized portion of the sacrum is normal. IMPRESSION: 1. Status post right L4 laminectomy with resolution of right lateral recess stenosis. Fluid collection at the operative site causes mild mass effect on the right dorsal aspect of the thecal sac. Unchanged severe right neural foraminal stenosis. 2. Otherwise unchanged appearance of the lumbar spine with multilevel lateral recess and foraminal stenosis, greatest at L3-4. Electronically Signed   By: Ulyses Jarred M.D.   On: 09/22/2018 22:38     Assessment & Plan:  Plan  I have discontinued [place] A. Barreiro's ranitidine. I am also having her maintain her acetaminophen, Digestive Enzymes (ENZYMATIC DIGESTANT PO), Glucos-MSM-C-Mn-Ginger-Willow (GLUCOSAMINE MSM COMPLEX  PO), fluticasone, HYDROcodone-homatropine, GuaiFENesin (MUCINEX PO), amLODipine, SYNTHROID, meloxicam, propranolol, medroxyPROGESTERone, estradiol, spironolactone, methocarbamol, and omeprazole.  No orders of the defined types were placed in this encounter.   Problem List Items Addressed This Visit    None    Visit Diagnoses    Spinal stenosis of lumbar region, unspecified whether neurogenic claudication present    -  Primary   Relevant Orders   Ambulatory referral to Orthopedic Surgery    new referral put in at pt request Follow-up: Return if symptoms worsen or fail to improve.  Ann Held, DO

## 2018-10-07 NOTE — Patient Instructions (Signed)

## 2018-10-09 ENCOUNTER — Encounter: Payer: Self-pay | Admitting: Family Medicine

## 2018-10-10 ENCOUNTER — Encounter: Payer: Self-pay | Admitting: Family Medicine

## 2018-10-18 DIAGNOSIS — M545 Low back pain: Secondary | ICD-10-CM | POA: Diagnosis not present

## 2018-10-19 DIAGNOSIS — M545 Low back pain: Secondary | ICD-10-CM | POA: Diagnosis not present

## 2018-10-31 DIAGNOSIS — Z8551 Personal history of malignant neoplasm of bladder: Secondary | ICD-10-CM | POA: Diagnosis not present

## 2018-11-01 DIAGNOSIS — M545 Low back pain: Secondary | ICD-10-CM | POA: Diagnosis not present

## 2018-11-03 DIAGNOSIS — M545 Low back pain: Secondary | ICD-10-CM | POA: Diagnosis not present

## 2018-11-10 DIAGNOSIS — M545 Low back pain: Secondary | ICD-10-CM | POA: Diagnosis not present

## 2018-11-11 DIAGNOSIS — M545 Low back pain: Secondary | ICD-10-CM | POA: Diagnosis not present

## 2018-11-14 DIAGNOSIS — Z9889 Other specified postprocedural states: Secondary | ICD-10-CM | POA: Diagnosis not present

## 2018-11-14 DIAGNOSIS — M47896 Other spondylosis, lumbar region: Secondary | ICD-10-CM | POA: Diagnosis not present

## 2018-11-14 DIAGNOSIS — M5126 Other intervertebral disc displacement, lumbar region: Secondary | ICD-10-CM | POA: Diagnosis not present

## 2018-11-14 DIAGNOSIS — M48061 Spinal stenosis, lumbar region without neurogenic claudication: Secondary | ICD-10-CM | POA: Diagnosis not present

## 2018-11-14 DIAGNOSIS — M5136 Other intervertebral disc degeneration, lumbar region: Secondary | ICD-10-CM | POA: Diagnosis not present

## 2018-11-15 DIAGNOSIS — M545 Low back pain: Secondary | ICD-10-CM | POA: Diagnosis not present

## 2018-11-22 DIAGNOSIS — M545 Low back pain: Secondary | ICD-10-CM | POA: Diagnosis not present

## 2018-11-23 DIAGNOSIS — M545 Low back pain: Secondary | ICD-10-CM | POA: Diagnosis not present

## 2018-11-30 DIAGNOSIS — Z85820 Personal history of malignant melanoma of skin: Secondary | ICD-10-CM | POA: Diagnosis not present

## 2018-11-30 DIAGNOSIS — L270 Generalized skin eruption due to drugs and medicaments taken internally: Secondary | ICD-10-CM | POA: Diagnosis not present

## 2018-11-30 DIAGNOSIS — M545 Low back pain: Secondary | ICD-10-CM | POA: Diagnosis not present

## 2018-12-06 DIAGNOSIS — M545 Low back pain: Secondary | ICD-10-CM | POA: Diagnosis not present

## 2018-12-10 ENCOUNTER — Ambulatory Visit (INDEPENDENT_AMBULATORY_CARE_PROVIDER_SITE_OTHER): Payer: Medicare Other | Admitting: Family Medicine

## 2018-12-10 ENCOUNTER — Encounter: Payer: Self-pay | Admitting: Family Medicine

## 2018-12-10 VITALS — BP 128/74 | HR 59 | Temp 97.6°F | Ht 63.0 in | Wt 175.0 lb

## 2018-12-10 DIAGNOSIS — R21 Rash and other nonspecific skin eruption: Secondary | ICD-10-CM | POA: Diagnosis not present

## 2018-12-10 MED ORDER — BETAMETHASONE DIPROPIONATE 0.05 % EX CREA: TOPICAL_CREAM | Freq: Two times a day (BID) | CUTANEOUS | 0 refills | Status: DC

## 2018-12-10 MED ORDER — PREDNISONE 10 MG PO TABS: ORAL_TABLET | ORAL | 0 refills | Status: DC

## 2018-12-10 NOTE — Progress Notes (Signed)
Subjective:    Patient ID: Wendy Velazquez, female    DOB: 06-Sep-1941, 78 y.o.   MRN: [id number]  HPI   Patient presents to clinic complaining of a rash on upper back going down bilateral arms.  Suspects it could possibly be from a medication that she has taken such as gabapentin.  Patient took this at the beginning of January to treat low back pain.  Patient has seen dermatology for this rash, was prescribed a triamcinolone cream, patient has been using this cream for approximately a week without full resolution of rash.  Patient is concerned that the rash could be shingles, would like someone to look at this and rule that out.  Per patient dermatologist told her she does not believe it is a food allergy rash.  Patient states dermatologist believes it is a medication reaction type rash.  Patient denies any new soaps, lotions, detergents.  Patient Active Problem List   Diagnosis Date Noted  . Postmenopausal 05/05/2018  . Primary osteoarthritis involving multiple joints 05/05/2018  . Chronic right-sided low back pain with right-sided sciatica 05/05/2018  . Spider veins of both lower extremities 08/11/2017  . Acute sinus infection 10/24/2014  . Obesity (BMI 30-39.9) 01/01/2014  . Hx of cyst of breast 07/21/2012  . MELANOMA, FOOT, RIGHT 03/13/2010  . OTHER ACUTE REACTIONS TO STRESS 06/20/2008  . NEOPLASM, MALIGNANT, BLADDER, TRANSITIONAL CELL 03/02/2008  . Hypothyroidism 03/02/2008  . HEARING LOSS, BILATERAL 03/02/2008  . Essential hypertension 03/02/2008  . POSTMENOPAUSAL STATUS 03/02/2008  . MELANOMA, LEG, HX OF 03/02/2008  . URI 09/23/2007  . DERMATITIS, CONTACT, NEC 07/28/2007  . ABSCESS, SKIN 07/20/2007  . Acute sinusitis, unspecified 05/19/2007   Social History   Tobacco Use  . Smoking status: Former Smoker    Packs/day: 0.30    Years: 15.00    Pack years: 4.50    Last attempt to quit: 03/15/1981    Years since quitting: 37.7  . Smokeless tobacco: Never Used    Substance Use Topics  . Alcohol use: Yes    Alcohol/week: 1.0 - 2.0 standard drinks    Types: 1 - 2 Glasses of wine per week     Review of Systems  Constitutional: Negative for chills, fatigue and fever.  HENT: Negative for congestion, ear pain, sinus pain and sore throat.   Eyes: Negative.   Respiratory: Negative for cough, shortness of breath and wheezing.   Cardiovascular: Negative for chest pain, palpitations and leg swelling.  Gastrointestinal: Negative for abdominal pain, diarrhea, nausea and vomiting.  Genitourinary: Negative for dysuria, frequency and urgency.  Musculoskeletal: Negative for arthralgias and myalgias.  Skin: +rash on back and arms.   Neurological: Negative for syncope, light-headedness and headaches.  Psychiatric/Behavioral: The patient is not nervous/anxious.       Objective:   Physical Exam Vitals signs and nursing note reviewed.  Constitutional:      General: She is not in acute distress.    Appearance: Normal appearance. She is not toxic-appearing.  HENT:     Head: Normocephalic and atraumatic.  Eyes:     General: No scleral icterus.    Extraocular Movements: Extraocular movements intact.     Conjunctiva/sclera: Conjunctivae normal.  Cardiovascular:     Rate and Rhythm: Normal rate and regular rhythm.  Pulmonary:     Effort: Pulmonary effort is normal. No respiratory distress.     Breath sounds: Normal breath sounds.  Skin:    General: Skin is warm and dry.  Findings: Rash present.          Comments: Faint red raised maculopapular rash across entire upper back and down both arms.  No vesicles or pustules seen.  Neurological:     Mental Status: She is alert and oriented to person, place, and time.  Psychiatric:        Mood and Affect: Mood normal.        Behavior: Behavior normal.     Vitals:   12/10/18 0857  BP: 128/74  Pulse: (!) 59  Temp: 97.6 F (36.4 C)  SpO2: 97%       Assessment & Plan:   Rash-unclear reason for  rash.  Reassured patient that I do not feel this is shingles.  We will do oral steroid taper to calm down inflammation and also try a different steroid cream, betamethasone, to see if this works better to calm rash.  Patient no longer taking gabapentin as this is thought to be the culprit of her rash.  Advised that if rash is not improving over the next 1 to 2 weeks, to follow back up with PCP for reevaluation.

## 2018-12-19 DIAGNOSIS — M47896 Other spondylosis, lumbar region: Secondary | ICD-10-CM | POA: Diagnosis not present

## 2018-12-19 DIAGNOSIS — M5136 Other intervertebral disc degeneration, lumbar region: Secondary | ICD-10-CM | POA: Diagnosis not present

## 2018-12-19 DIAGNOSIS — M48061 Spinal stenosis, lumbar region without neurogenic claudication: Secondary | ICD-10-CM | POA: Diagnosis not present

## 2018-12-19 DIAGNOSIS — M5416 Radiculopathy, lumbar region: Secondary | ICD-10-CM | POA: Diagnosis not present

## 2019-01-16 DIAGNOSIS — Z7952 Long term (current) use of systemic steroids: Secondary | ICD-10-CM | POA: Diagnosis not present

## 2019-01-16 DIAGNOSIS — Z9889 Other specified postprocedural states: Secondary | ICD-10-CM | POA: Diagnosis not present

## 2019-01-16 DIAGNOSIS — M4726 Other spondylosis with radiculopathy, lumbar region: Secondary | ICD-10-CM | POA: Diagnosis not present

## 2019-01-16 DIAGNOSIS — M5136 Other intervertebral disc degeneration, lumbar region: Secondary | ICD-10-CM | POA: Diagnosis not present

## 2019-01-18 DIAGNOSIS — Z961 Presence of intraocular lens: Secondary | ICD-10-CM | POA: Diagnosis not present

## 2019-02-02 DIAGNOSIS — M4306 Spondylolysis, lumbar region: Secondary | ICD-10-CM | POA: Diagnosis not present

## 2019-02-02 DIAGNOSIS — M5416 Radiculopathy, lumbar region: Secondary | ICD-10-CM | POA: Diagnosis not present

## 2019-02-02 DIAGNOSIS — M47817 Spondylosis without myelopathy or radiculopathy, lumbosacral region: Secondary | ICD-10-CM | POA: Diagnosis not present

## 2019-02-20 DIAGNOSIS — M5416 Radiculopathy, lumbar region: Secondary | ICD-10-CM | POA: Diagnosis not present

## 2019-02-20 DIAGNOSIS — M47896 Other spondylosis, lumbar region: Secondary | ICD-10-CM | POA: Diagnosis not present

## 2019-02-20 DIAGNOSIS — M5136 Other intervertebral disc degeneration, lumbar region: Secondary | ICD-10-CM | POA: Diagnosis not present

## 2019-02-20 DIAGNOSIS — Z9889 Other specified postprocedural states: Secondary | ICD-10-CM | POA: Diagnosis not present

## 2019-02-20 DIAGNOSIS — M48061 Spinal stenosis, lumbar region without neurogenic claudication: Secondary | ICD-10-CM | POA: Diagnosis not present

## 2019-03-13 DIAGNOSIS — M5136 Other intervertebral disc degeneration, lumbar region: Secondary | ICD-10-CM | POA: Diagnosis not present

## 2019-03-13 DIAGNOSIS — M47816 Spondylosis without myelopathy or radiculopathy, lumbar region: Secondary | ICD-10-CM | POA: Diagnosis not present

## 2019-03-13 DIAGNOSIS — Z9889 Other specified postprocedural states: Secondary | ICD-10-CM | POA: Diagnosis not present

## 2019-03-13 DIAGNOSIS — M48061 Spinal stenosis, lumbar region without neurogenic claudication: Secondary | ICD-10-CM | POA: Diagnosis not present

## 2019-03-28 DIAGNOSIS — M5116 Intervertebral disc disorders with radiculopathy, lumbar region: Secondary | ICD-10-CM | POA: Diagnosis not present

## 2019-03-28 DIAGNOSIS — M47896 Other spondylosis, lumbar region: Secondary | ICD-10-CM | POA: Diagnosis not present

## 2019-03-28 DIAGNOSIS — I10 Essential (primary) hypertension: Secondary | ICD-10-CM | POA: Diagnosis not present

## 2019-04-07 ENCOUNTER — Ambulatory Visit (INDEPENDENT_AMBULATORY_CARE_PROVIDER_SITE_OTHER): Payer: Medicare Other | Admitting: Family Medicine

## 2019-04-07 ENCOUNTER — Encounter: Payer: Self-pay | Admitting: Family Medicine

## 2019-04-07 ENCOUNTER — Other Ambulatory Visit: Payer: Self-pay

## 2019-04-07 DIAGNOSIS — G8929 Other chronic pain: Secondary | ICD-10-CM | POA: Diagnosis not present

## 2019-04-07 DIAGNOSIS — M5441 Lumbago with sciatica, right side: Secondary | ICD-10-CM | POA: Diagnosis not present

## 2019-04-07 DIAGNOSIS — M150 Primary generalized (osteo)arthritis: Secondary | ICD-10-CM

## 2019-04-07 DIAGNOSIS — M159 Polyosteoarthritis, unspecified: Secondary | ICD-10-CM

## 2019-04-07 NOTE — Assessment & Plan Note (Signed)
Stop meloxicam since it is not helping

## 2019-04-07 NOTE — Assessment & Plan Note (Signed)
F/u neurosurgery  

## 2019-04-07 NOTE — Progress Notes (Signed)
Virtual Visit via Video Note  I connected with Wendy Velazquez on 04/07/19 at  3:00 PM EDT by a video enabled telemedicine application and verified that I am speaking with the correct person using two identifiers.  Location: Patient: home Provider: office   I discussed the limitations of evaluation and management by telemedicine and the availability of in person appointments. The patient expressed understanding and agreed to proceed.  History of Present Illness: Pt is home c/o gas after meloxicam and prevacid.  She states the meloxicam doesn't really help the pain anyway and the neuo did a steroid injection which helped a lot.  She may need more surgeyr.     Observations/Objective: Today's Vitals   04/07/19 1444  BP: (!) 155/108  Temp: 98.6 F (37 C)  140/92  Repeat bp --- normally lower 120 80   She checkes it every night Pt in NAD  There is no height or weight on file to calculate BMI.  Assessment and Plan: .1. Primary osteoarthritis involving multiple joints Stop mobic  Use tylenol arthritis Can use prevacid prn  mobic caused gas and diarrhea    Follow Up Instructions:    I discussed the assessment and treatment plan with the patient. The patient was provided an opportunity to ask questions and all were answered. The patient agreed with the plan and demonstrated an understanding of the instructions.   The patient was advised to call back or seek an in-person evaluation if the symptoms worsen or if the condition fails to improve as anticipated.  I provided 15 minutes of non-face-to-face time during this encounter.   Ann Held, DO

## 2019-04-24 DIAGNOSIS — M5136 Other intervertebral disc degeneration, lumbar region: Secondary | ICD-10-CM | POA: Diagnosis not present

## 2019-04-24 DIAGNOSIS — M5416 Radiculopathy, lumbar region: Secondary | ICD-10-CM | POA: Diagnosis not present

## 2019-04-24 DIAGNOSIS — M47816 Spondylosis without myelopathy or radiculopathy, lumbar region: Secondary | ICD-10-CM | POA: Diagnosis not present

## 2019-05-17 DIAGNOSIS — Z13820 Encounter for screening for osteoporosis: Secondary | ICD-10-CM | POA: Diagnosis not present

## 2019-05-17 DIAGNOSIS — N959 Unspecified menopausal and perimenopausal disorder: Secondary | ICD-10-CM | POA: Diagnosis not present

## 2019-05-17 DIAGNOSIS — M5416 Radiculopathy, lumbar region: Secondary | ICD-10-CM | POA: Diagnosis not present

## 2019-05-17 DIAGNOSIS — M5136 Other intervertebral disc degeneration, lumbar region: Secondary | ICD-10-CM | POA: Diagnosis not present

## 2019-05-17 DIAGNOSIS — M47816 Spondylosis without myelopathy or radiculopathy, lumbar region: Secondary | ICD-10-CM | POA: Diagnosis not present

## 2019-05-22 ENCOUNTER — Ambulatory Visit: Payer: Medicare Other | Admitting: *Deleted

## 2019-05-22 ENCOUNTER — Other Ambulatory Visit: Payer: Self-pay | Admitting: Family Medicine

## 2019-06-01 ENCOUNTER — Other Ambulatory Visit: Payer: Self-pay

## 2019-06-01 NOTE — Progress Notes (Deleted)
Virtual Visit via Video Note  I connected with patient on 06/02/19 at  8:00 AM EDT by audio enabled telemedicine application and verified that I am speaking with the correct person using two identifiers.   THIS ENCOUNTER IS A VIRTUAL VISIT DUE TO COVID-19 - PATIENT WAS NOT SEEN IN THE OFFICE. PATIENT HAS CONSENTED TO VIRTUAL VISIT / TELEMEDICINE VISIT   Location of patient: home  Location of provider: office  I discussed the limitations of evaluation and management by telemedicine and the availability of in person appointments. The patient expressed understanding and agreed to proceed.   Subjective:   Wendy Velazquez is a 78 y.o. female who presents for Medicare Annual (Subsequent) preventive examination.  Review of Systems: No ROS.  Medicare Wellness Virtual Visit.  Visual/audio telehealth visit, UTA vital signs.   See social history for additional risk factors.   Sleep patterns:  Home Safety/Smoke Alarms: Feels safe in home. Smoke alarms in place.  Lives alone in 1 story townhome. Walk-in shower.   Female:   Pap-       Mammo-       Dexa scan-        CCS- Cologuard 05/23/17-negative     Objective:     Vitals: There were no vitals taken for this visit.  There is no height or weight on file to calculate BMI.  Advanced Directives 05/05/2018 05/04/2017 04/21/2016  Does Patient Have a Medical Advance Directive? Yes Yes Yes  Type of Paramedic of Wendy Velazquez;Living will Wendy Velazquez;Living will Wendy Velazquez;Living will  Does patient want to make changes to medical advance directive? - - No - Patient declined  Copy of [place] in Chart? No - copy requested No - copy requested No - copy requested    Tobacco Social History   Tobacco Use  Smoking Status Former Smoker  . Packs/day: 0.30  . Years: 15.00  . Pack years: 4.50  . Quit date: 03/15/1981  . Years since quitting: 38.2  Smokeless Tobacco Never Used      Counseling given: Not Answered   Clinical Intake:                       Past Medical History:  Diagnosis Date  . Arthritis   . Hypertension   . Melanoma ([place])   . MRSA (methicillin resistant staph aureus) culture positive   . Osteopenia   . Rapid heart beat    benigh  . Transitional cell carcinoma [facility])    Past Surgical History:  Procedure Laterality Date  . ABDOMINAL HYSTERECTOMY  1987   partial  . BACK SURGERY     08/02/2018  . CATARACT EXTRACTION  2004  . EYE SURGERY     cataract b/l  . MELANOMA EXCISION  1977   left leg  . MELANOMA EXCISION  06-24-09   r foot  . SKIN CANCER EXCISION  1999   bladder transitional cell  . teeth implants    . TONSILLECTOMY     Family History  Problem Relation Age of Onset  . Diabetes Mother   . Alzheimer's disease Mother   . Dementia Mother   . Stroke Father   . Hypertension Father    Social History   Socioeconomic History  . Marital status: Widowed    Spouse name: Not on file  . Number of children: Not on file  . Years of education: Not on file  . Highest education  level: Not on file  Occupational History  . Occupation: Product/process development scientist: [employer]    Comment: 4 days a week  Social Needs  . Financial resource strain: Not on file  . Food insecurity    Worry: Not on file    Inability: Not on file  . Transportation needs    Medical: Not on file    Non-medical: Not on file  Tobacco Use  . Smoking status: Former Smoker    Packs/day: 0.30    Years: 15.00    Pack years: 4.50    Quit date: 03/15/1981    Years since quitting: 38.2  . Smokeless tobacco: Never Used  Substance and Sexual Activity  . Alcohol use: Yes    Alcohol/week: 1.0 - 2.0 standard drinks    Types: 1 - 2 Glasses of wine per week  . Drug use: No  . Sexual activity: Not Currently    Partners: Male  Lifestyle  . Physical activity    Days per week: Not on file    Minutes per session: Not on file  . Stress: Not on file   Relationships  . Social Herbalist on phone: Not on file    Gets together: Not on file    Attends religious service: Not on file    Active member of club or organization: Not on file    Attends meetings of clubs or organizations: Not on file    Relationship status: Not on file  Other Topics Concern  . Not on file  Social History Narrative   Exercise--- dancing    Outpatient Encounter Medications as of 06/02/2019  Medication Sig  . acetaminophen (TYLENOL ARTHRITIS PAIN) 650 MG CR tablet Take 650 mg by mouth 3 (three) times daily as needed.   Marland Kitchen amLODipine (NORVASC) 5 MG tablet Take 1 tablet (5 mg total) by mouth daily.  . betamethasone dipropionate (DIPROLENE) 0.05 % cream Apply topically 2 (two) times daily. Use thin layer on affected skin areas  . Digestive Enzymes (ENZYMATIC DIGESTANT PO) Take by mouth. Enzymatic Therapy probiotic pearls  . estradiol (ESTRACE) 0.5 MG tablet TAKE 1 TABLET DAILY  . fluticasone (FLONASE) 50 MCG/ACT nasal spray Place 2 sprays into both nostrils daily.  Wendy Velazquez (GLUCOSAMINE MSM COMPLEX PO) Take 1 tablet by mouth daily.  . GuaiFENesin (MUCINEX PO) Take 200 mg by mouth at bedtime.  . medroxyPROGESTERone (PROVERA) 5 MG tablet 1 tab po qd 10 days a month as directed.  Brand name necessary.  Marland Kitchen omeprazole (PRILOSEC) 20 MG capsule Take 1 capsule by mouth daily.  . propranolol (INDERAL) 10 MG tablet Take 1 tablet (10 mg total) by mouth 2 (two) times daily.  Marland Kitchen spironolactone (ALDACTONE) 25 MG tablet 1/2 tab po qd  . SYNTHROID 75 MCG tablet Take 1 tablet (75 mcg total) by mouth daily.   No facility-administered encounter medications on file as of 06/02/2019.     Activities of Daily Living No flowsheet data found.  Patient Care Team: Carollee Herter, Alferd Apa, DO as PCP - General Luberta Mutter, MD as Consulting Physician (Ophthalmology) Myrlene Broker, MD as Attending Physician (Urology) Rolm Bookbinder, MD as Consulting  Physician (Dermatology)    Assessment:   This is a routine wellness examination for Wendy Velazquez. Physical assessment deferred to PCP.  Exercise Activities and Dietary recommendations   Diet (meal preparation, eat out, water intake, caffeinated beverages, dairy products, fruits and vegetables): {Desc; diets:16563} Breakfast: Lunch:  Dinner:  Goals    . Maintain current health    . Maintain current health status.       Fall Risk Fall Risk  05/05/2018 05/04/2017 04/21/2016 08/16/2015 04/18/2015  Falls in the past year? No No No No No    Depression Screen PHQ 2/9 Scores 05/05/2018 05/04/2017 04/21/2016 08/16/2015  PHQ - 2 Score 0 0 0 0  Exception Documentation - - - -     Cognitive Function Ad8 score reviewed for issues:  Issues making decisions:  Less interest in hobbies / activities:  Repeats questions, stories (family complaining):  Trouble using ordinary gadgets (microwave, computer, phone):  Forgets the month or year:   Mismanaging finances:   Remembering appts:  Daily problems with thinking and/or memory: Ad8 score is=     MMSE - Mini Mental State Exam 05/04/2017 04/21/2016  Orientation to time 5 5  Orientation to Place 5 5  Registration 3 3  Attention/ Calculation 5 5  Recall 3 3  Language- name 2 objects 2 2  Language- repeat 1 1  Language- follow 3 step command 3 3  Language- read & follow direction 1 1  Write a sentence 1 1  Copy design 1 1  Total score 30 30        Immunization History  Administered Date(s) Administered  . DTaP 06/05/2013  . Influenza Whole 09/12/2008  . Influenza, High Dose Seasonal PF 08/29/2014, 07/12/2018  . Influenza-Unspecified 11/30/2012, 09/16/2013, 08/17/2016  . PPD Test 02/11/2016, 02/18/2016  . Pneumococcal Conjugate-13 04/21/2016  . Pneumococcal Polysaccharide-23 03/02/2008  . Td 11/29/2002  . Tdap 06/05/2013  . Zoster 03/02/2008   Screening Tests Health Maintenance  Topic Date Due  . MAMMOGRAM  02/13/2015  .  INFLUENZA VACCINE  06/17/2019  . TETANUS/TDAP  06/06/2023  . DEXA SCAN  Completed  . PNA vac Low Risk Adult  Completed      Plan:   ***   I have personally reviewed and noted the following in the patient's chart:   . Medical and social history . Use of alcohol, tobacco or illicit drugs  . Current medications and supplements . Functional ability and status . Nutritional status . Physical activity . Advanced directives . List of other physicians . Hospitalizations, surgeries, and ER visits in previous 12 months . Vitals . Screenings to include cognitive, depression, and falls . Referrals and appointments  In addition, I have reviewed and discussed with patient certain preventive protocols, quality metrics, and best practice recommendations. A written personalized care plan for preventive services as well as general preventive health recommendations were provided to patient.     Shela Nevin, [place]  06/01/2019

## 2019-06-02 ENCOUNTER — Encounter: Payer: Self-pay | Admitting: Family Medicine

## 2019-06-02 ENCOUNTER — Ambulatory Visit (INDEPENDENT_AMBULATORY_CARE_PROVIDER_SITE_OTHER): Payer: Medicare Other | Admitting: Family Medicine

## 2019-06-02 ENCOUNTER — Ambulatory Visit: Payer: Medicare Other | Admitting: *Deleted

## 2019-06-02 VITALS — BP 147/92 | HR 62 | Temp 98.3°F | Resp 18 | Ht 63.0 in | Wt 167.2 lb

## 2019-06-02 DIAGNOSIS — Z780 Asymptomatic menopausal state: Secondary | ICD-10-CM | POA: Diagnosis not present

## 2019-06-02 DIAGNOSIS — M150 Primary generalized (osteo)arthritis: Secondary | ICD-10-CM

## 2019-06-02 DIAGNOSIS — M5441 Lumbago with sciatica, right side: Secondary | ICD-10-CM | POA: Diagnosis not present

## 2019-06-02 DIAGNOSIS — G8929 Other chronic pain: Secondary | ICD-10-CM | POA: Diagnosis not present

## 2019-06-02 DIAGNOSIS — I10 Essential (primary) hypertension: Secondary | ICD-10-CM

## 2019-06-02 DIAGNOSIS — Z1239 Encounter for other screening for malignant neoplasm of breast: Secondary | ICD-10-CM

## 2019-06-02 DIAGNOSIS — M159 Polyosteoarthritis, unspecified: Secondary | ICD-10-CM

## 2019-06-02 DIAGNOSIS — E2839 Other primary ovarian failure: Secondary | ICD-10-CM | POA: Diagnosis not present

## 2019-06-02 DIAGNOSIS — E039 Hypothyroidism, unspecified: Secondary | ICD-10-CM

## 2019-06-02 DIAGNOSIS — Z1231 Encounter for screening mammogram for malignant neoplasm of breast: Secondary | ICD-10-CM

## 2019-06-02 LAB — TSH: TSH: 0.83 u[IU]/mL (ref 0.35–4.50)

## 2019-06-02 LAB — LIPID PANEL
Cholesterol: 149 mg/dL (ref 0–200)
HDL: 39.4 mg/dL (ref >39.00)
LDL Cholesterol: 76 mg/dL (ref 0–99)
NonHDL: 109.79
Total CHOL/HDL Ratio: 4
Triglycerides: 171 mg/dL — ABNORMAL HIGH (ref 0.0–149.0)
VLDL: 34.2 mg/dL (ref 0.0–40.0)

## 2019-06-02 LAB — COMPREHENSIVE METABOLIC PANEL
ALT: 12 U/L (ref 0–35)
AST: 16 U/L (ref 0–37)
Albumin: 4.2 g/dL (ref 3.5–5.2)
Alkaline Phosphatase: 71 U/L (ref 39–117)
BUN: 18 mg/dL (ref 6–23)
CO2: 30 mEq/L (ref 19–32)
Calcium: 9.1 mg/dL (ref 8.4–10.5)
Chloride: 103 mEq/L (ref 96–112)
Creatinine, Ser: 1.03 mg/dL (ref 0.40–1.20)
GFR: 51.96 mL/min — ABNORMAL LOW (ref >60.00)
Glucose, Bld: 110 mg/dL — ABNORMAL HIGH (ref 70–99)
Potassium: 4 mEq/L (ref 3.5–5.1)
Sodium: 140 mEq/L (ref 135–145)
Total Bilirubin: 1 mg/dL (ref 0.2–1.2)
Total Protein: 6.3 g/dL (ref 6.0–8.3)

## 2019-06-02 MED ORDER — MEDROXYPROGESTERONE ACETATE 5 MG PO TABS: ORAL_TABLET | ORAL | 3 refills | Status: DC

## 2019-06-02 MED ORDER — AMLODIPINE BESYLATE 5 MG PO TABS: 5.0000 mg | ORAL_TABLET | Freq: Every day | ORAL | 3 refills | Status: DC

## 2019-06-02 MED ORDER — PROPRANOLOL HCL 10 MG PO TABS: 10.0000 mg | ORAL_TABLET | Freq: Two times a day (BID) | ORAL | 3 refills | Status: DC

## 2019-06-02 MED ORDER — ESTRADIOL 0.5 MG PO TABS: 0.5000 mg | ORAL_TABLET | Freq: Every day | ORAL | 1 refills | Status: DC

## 2019-06-02 MED ORDER — MELOXICAM 15 MG PO TABS
ORAL_TABLET | ORAL | 1 refills | Status: DC
Start: 2019-06-02 — End: 2019-12-20

## 2019-06-02 MED ORDER — SYNTHROID 75 MCG PO TABS: 75.0000 ug | ORAL_TABLET | Freq: Every day | ORAL | 3 refills | Status: DC

## 2019-06-02 MED ORDER — SPIRONOLACTONE 25 MG PO TABS
ORAL_TABLET | ORAL | 3 refills | Status: DC
Start: 2019-06-02 — End: 2020-06-11

## 2019-06-02 NOTE — Patient Instructions (Signed)
Preventive Care 77 Years and Older, Female Preventive care refers to lifestyle choices and visits with your health care provider that can promote health and wellness. This includes:  A yearly physical exam. This is also called an annual well check.  Regular dental and eye exams.  Immunizations.  Screening for certain conditions.  Healthy lifestyle choices, such as diet and exercise. What can I expect for my preventive care visit? Physical exam Your health care provider will check:  Height and weight. These may be used to calculate body mass index (BMI), which is a measurement that tells if you are at a healthy weight.  Heart rate and blood pressure.  Your skin for abnormal spots. Counseling Your health care provider may ask you questions about:  Alcohol, tobacco, and drug use.  Emotional well-being.  Home and relationship well-being.  Sexual activity.  Eating habits.  History of falls.  Memory and ability to understand (cognition).  Work and work Statistician.  Pregnancy and menstrual history. What immunizations do I need?  Influenza (flu) vaccine  This is recommended every year. Tetanus, diphtheria, and pertussis (Tdap) vaccine  You may need a Td booster every 10 years. Varicella (chickenpox) vaccine  You may need this vaccine if you have not already been vaccinated. Zoster (shingles) vaccine  You may need this after age 77. Pneumococcal conjugate (PCV13) vaccine  One dose is recommended after age 77. Pneumococcal polysaccharide (PPSV23) vaccine  One dose is recommended after age 72. Measles, mumps, and rubella (MMR) vaccine  You may need at least one dose of MMR if you were born in 1957 or later. You may also need a second dose. Meningococcal conjugate (MenACWY) vaccine  You may need this if you have certain conditions. Hepatitis A vaccine  You may need this if you have certain conditions or if you travel or work in places where you may be exposed  to hepatitis A. Hepatitis B vaccine  You may need this if you have certain conditions or if you travel or work in places where you may be exposed to hepatitis B. Haemophilus influenzae type b (Hib) vaccine  You may need this if you have certain conditions. You may receive vaccines as individual doses or as more than one vaccine together in one shot (combination vaccines). Talk with your health care provider about the risks and benefits of combination vaccines. What tests do I need? Blood tests  Lipid and cholesterol levels. These may be checked every 5 years, or more frequently depending on your overall health.  Hepatitis C test.  Hepatitis B test. Screening  Lung cancer screening. You may have this screening every year starting at age 77 if you have a 30-pack-year history of smoking and currently smoke or have quit within the past 15 years.  Colorectal cancer screening. All adults should have this screening starting at age 77 and continuing until age 15. Your health care provider may recommend screening at age 77 if you are at increased risk. You will have tests every 1-10 years, depending on your results and the type of screening test.  Diabetes screening. This is done by checking your blood sugar (glucose) after you have not eaten for a while (fasting). You may have this done every 1-3 years.  Mammogram. This may be done every 1-2 years. Talk with your health care provider about how often you should have regular mammograms.  BRCA-related cancer screening. This may be done if you have a family history of breast, ovarian, tubal, or peritoneal cancers.  Other tests  Sexually transmitted disease (STD) testing.  Bone density scan. This is done to screen for osteoporosis. You may have this done starting at age 77. Follow these instructions at home: Eating and drinking  Eat a diet that includes fresh fruits and vegetables, whole grains, lean protein, and low-fat dairy products. Limit  your intake of foods with high amounts of sugar, saturated fats, and salt.  Take vitamin and mineral supplements as recommended by your health care provider.  Do not drink alcohol if your health care provider tells you not to drink.  If you drink alcohol: ? Limit how much you have to 0-1 drink a day. ? Be aware of how much alcohol is in your drink. In the [place] one drink equals one 12 oz bottle of beer (355 mL), one 5 oz glass of wine (148 mL), or one 1 oz glass of hard liquor (44 mL). Lifestyle  Take daily care of your teeth and gums.  Stay active. Exercise for at least 30 minutes on 5 or more days each week.  Do not use any products that contain nicotine or tobacco, such as cigarettes, e-cigarettes, and chewing tobacco. If you need help quitting, ask your health care provider.  If you are sexually active, practice safe sex. Use a condom or other form of protection in order to prevent STIs (sexually transmitted infections).  Talk with your health care provider about taking a low-dose aspirin or statin. What's next?  Go to your health care provider once a year for a well check visit.  Ask your health care provider how often you should have your eyes and teeth checked.  Stay up to date on all vaccines. This information is not intended to replace advice given to you by your health care provider. Make sure you discuss any questions you have with your health care provider. Document Released: 11/29/2015 Document Revised: 10/27/2018 Document Reviewed: 10/27/2018 Elsevier Patient Education  2020 [employer].

## 2019-06-02 NOTE — Progress Notes (Signed)
Patient ID: Wendy Velazquez, female    DOB: 05/20/1941  Age: 78 y.o. MRN: [id number]   Subjective:  Subjective  HPI [name] presents for f/u bp and and thyroid.    She is getting ready to see another neurosurgeon in [place] about her back.   No other complaints.    Review of Systems  Constitutional: Negative for appetite change, diaphoresis, fatigue and unexpected weight change.  Eyes: Negative for pain, redness and visual disturbance.  Respiratory: Negative for cough, chest tightness, shortness of breath and wheezing.   Cardiovascular: Negative for chest pain, palpitations and leg swelling.  Endocrine: Negative for cold intolerance, heat intolerance, polydipsia, polyphagia and polyuria.  Genitourinary: Negative for difficulty urinating, dysuria and frequency.  Musculoskeletal: Positive for back pain.  Neurological: Negative for dizziness, light-headedness, numbness and headaches.    History Past Medical History:  Diagnosis Date   Arthritis    Hypertension    Melanoma ([place])    MRSA (methicillin resistant staph aureus) culture positive    Osteopenia    Rapid heart beat    benigh   Transitional cell carcinoma ([place])     She has a past surgical history that includes Tonsillectomy; Abdominal hysterectomy (1987); Melanoma excision (1977); Melanoma excision (06-24-09); Cataract extraction (2004); Skin cancer excision (1999); Eye surgery; teeth implants; and Back surgery.   Her family history includes Alzheimer's disease in her mother; Dementia in her mother; Diabetes in her mother; Hypertension in her father; Stroke in her father.She reports that she quit smoking about 38 years ago. She has a 4.50 pack-year smoking history. She has never used smokeless tobacco. She reports current alcohol use of about 1.0 - 2.0 standard drinks of alcohol per week. She reports that she does not use drugs.  Current Outpatient Medications on File Prior to Visit  Medication Sig Dispense  Refill   acetaminophen (TYLENOL ARTHRITIS PAIN) 650 MG CR tablet Take 650 mg by mouth 3 (three) times daily as needed.      betamethasone dipropionate (DIPROLENE) 0.05 % cream Apply topically 2 (two) times daily. Use thin layer on affected skin areas 45 g 0   Digestive Enzymes (ENZYMATIC DIGESTANT PO) Take by mouth. Enzymatic Therapy probiotic pearls     fluticasone (FLONASE) 50 MCG/ACT nasal spray Place 2 sprays into both nostrils daily. 16 g 1   Glucos-MSM-C-Mn-Ginger-Willow (GLUCOSAMINE MSM COMPLEX PO) Take 1 tablet by mouth daily.     GuaiFENesin (MUCINEX PO) Take 200 mg by mouth at bedtime.     No current facility-administered medications on file prior to visit.      Objective:  Objective  Physical Exam Constitutional:      Appearance: She is well-developed.  HENT:     Head: Normocephalic and atraumatic.  Eyes:     Conjunctiva/sclera: Conjunctivae normal.  Neck:     Musculoskeletal: Normal range of motion and neck supple.     Thyroid: No thyromegaly.     Vascular: No carotid bruit or JVD.  Cardiovascular:     Rate and Rhythm: Normal rate and regular rhythm.     Heart sounds: Normal heart sounds. No murmur.  Pulmonary:     Effort: Pulmonary effort is normal. No respiratory distress.     Breath sounds: Normal breath sounds. No wheezing or rales.  Chest:     Chest wall: No tenderness.  Neurological:     Mental Status: She is alert and oriented to person, place, and time.    BP (!) 147/92 (BP Location: Left Arm,  Patient Position: Sitting, Cuff Size: Normal)    Pulse 62    Temp 98.3 F (36.8 C) (Oral)    Resp 18    Ht 5\' 3"  (1.6 m)    Wt 167 lb 3.2 oz (75.8 kg)    SpO2 99%    BMI 29.62 kg/m  Wt Readings from Last 3 Encounters:  06/02/19 167 lb 3.2 oz (75.8 kg)  12/10/18 175 lb (79.4 kg)  10/07/18 175 lb 12.8 oz (79.7 kg)     Lab Results  Component Value Date   WBC 6.9 05/04/2017   HGB 14.6 05/04/2017   HCT 43.4 05/04/2017   PLT 204.0 05/04/2017   GLUCOSE 110  (H) 06/02/2019   CHOL 149 06/02/2019   TRIG 171.0 (H) 06/02/2019   HDL 39.40 06/02/2019   LDLDIRECT 102.0 04/18/2015   LDLCALC 76 06/02/2019   ALT 12 06/02/2019   AST 16 06/02/2019   NA 140 06/02/2019   K 4.0 06/02/2019   CL 103 06/02/2019   CREATININE 1.03 06/02/2019   BUN 18 06/02/2019   CO2 30 06/02/2019   TSH 0.83 06/02/2019   MICROALBUR 2.0 (H) 04/18/2015    Mr Lumbar Spine Wo Contrast  Result Date: 09/22/2018 CLINICAL DATA:  Low back pain radiating to the right leg EXAM: MRI LUMBAR SPINE WITHOUT CONTRAST TECHNIQUE: Multiplanar, multisequence MR imaging of the lumbar spine was performed. No intravenous contrast was administered. COMPARISON:  Lumbar spine MRI 12/03/2017 FINDINGS: Segmentation: Normal. The lowest disc space is considered to be L5-S1. Alignment:  There is dextroscoliosis with apex at L2. Vertebrae: Heterogeneous bone marrow signal. No focal lesion. No compression fracture or discitis-osteomyelitis. There are postsurgical changes at the L4 level. Conus medullaris and cauda equina: The conus medullaris terminates at the L1 level. The cauda equina and conus medullaris are both normal. Paraspinal and other soft tissues: There is postsurgical change at the L4 level with a fluid collection at the right laminectomy site that measures 18 x 10 mm. Disc levels: Sagittal plane imaging includes the T11-12 disc level through the upper sacrum, with axial imaging of the T12-L1 to L5-S1 disc levels. T11-12: Disc space narrowing without spinal canal stenosis. Facet hypertrophy contributes to mild bilateral foraminal narrowing. These findings are unchanged. T12-L1: Normal. L1-2: Disc desiccation without herniation. Normal facets. No spinal canal stenosis. No neural foraminal stenosis. Unchanged. L2-3: Disc space narrowing with degenerative endplate signal changes. Left eccentric bulge is unchanged, narrowing the left lateral recess. No central spinal canal stenosis. Mild left foraminal stenosis  is unchanged. L3-4: Disc desiccation and mild facet hypertrophy. Right-greater-than-left lateral recess narrowing, unchanged. No central spinal canal stenosis. Mild bilateral foraminal stenosis, unchanged. L4-5: Now status post right laminectomy. 18 x 10 mm collection at the laminectomy site provides mild mass effect on the right dorsal aspect of the thecal sac. Right lateral recess stenosis has been relieved. There is no spinal canal stenosis. Severe right foraminal stenosis is unchanged. L5-S1: Remote right hemilaminectomy. No spinal canal stenosis. No neural foraminal stenosis. The visualized portion of the sacrum is normal. IMPRESSION: 1. Status post right L4 laminectomy with resolution of right lateral recess stenosis. Fluid collection at the operative site causes mild mass effect on the right dorsal aspect of the thecal sac. Unchanged severe right neural foraminal stenosis. 2. Otherwise unchanged appearance of the lumbar spine with multilevel lateral recess and foraminal stenosis, greatest at L3-4. Electronically Signed   By: Ulyses Jarred M.D.   On: 09/22/2018 22:38     Assessment & Plan:  Plan  I have discontinued Veronnica A. Garber's omeprazole. I have also changed her estradiol. Additionally, I am having her maintain her acetaminophen, Digestive Enzymes (ENZYMATIC DIGESTANT PO), Glucos-MSM-C-Mn-Ginger-Willow (GLUCOSAMINE MSM COMPLEX PO), fluticasone, GuaiFENesin (MUCINEX PO), betamethasone dipropionate, amLODipine, medroxyPROGESTERone, propranolol, spironolactone, Synthroid, and meloxicam.  Meds ordered this encounter  Medications   amLODipine (NORVASC) 5 MG tablet    Sig: Take 1 tablet (5 mg total) by mouth daily.    Dispense:  90 tablet    Refill:  3   medroxyPROGESTERone (PROVERA) 5 MG tablet    Sig: 1 tab po qd 10 days a month as directed.  Brand name necessary.    Dispense:  90 tablet    Refill:  3    90 day supply   propranolol (INDERAL) 10 MG tablet    Sig: Take 1 tablet (10 mg  total) by mouth 2 (two) times daily.    Dispense:  180 tablet    Refill:  3   spironolactone (ALDACTONE) 25 MG tablet    Sig: 1/2 tab po qd    Dispense:  45 tablet    Refill:  3   SYNTHROID 75 MCG tablet    Sig: Take 1 tablet (75 mcg total) by mouth daily.    Dispense:  90 tablet    Refill:  3   estradiol (ESTRACE) 0.5 MG tablet    Sig: Take 1 tablet (0.5 mg total) by mouth daily.    Dispense:  90 tablet    Refill:  1   meloxicam (MOBIC) 15 MG tablet    Sig: 1 po qd prn    Dispense:  90 tablet    Refill:  1    Problem List Items Addressed This Visit      Unprioritized   Chronic right-sided low back pain with right-sided sciatica    Per neurosurgery       Relevant Medications   meloxicam (MOBIC) 15 MG tablet   Essential hypertension    Well controlled, no changes to meds. Encouraged heart healthy diet such as the DASH diet and exercise as tolerated.       Relevant Medications   amLODipine (NORVASC) 5 MG tablet   propranolol (INDERAL) 10 MG tablet   spironolactone (ALDACTONE) 25 MG tablet   Other Relevant Orders   Lipid panel (Completed)   Comprehensive metabolic panel (Completed)   Hypothyroidism    Check labs con't synthroid      Relevant Medications   propranolol (INDERAL) 10 MG tablet   SYNTHROID 75 MCG tablet   Other Relevant Orders   TSH (Completed)   Menopause   Relevant Medications   medroxyPROGESTERone (PROVERA) 5 MG tablet   Primary osteoarthritis involving multiple joints   Relevant Medications   meloxicam (MOBIC) 15 MG tablet    Other Visit Diagnoses    Estrogen deficiency    -  Primary   Relevant Orders   DG Bone Density   Breast cancer screening       Breast cancer screening, high risk patient       Encounter for screening mammogram for breast cancer          Follow-up: Return in about 6 months (around 12/03/2019), or if symptoms worsen or fail to improve, for hypertension, hyperlipidemia.  Ann Held, DO

## 2019-06-02 NOTE — Assessment & Plan Note (Signed)
Per neurosurgery  

## 2019-06-02 NOTE — Assessment & Plan Note (Signed)
Well controlled, no changes to meds. Encouraged heart healthy diet such as the DASH diet and exercise as tolerated.  °

## 2019-06-02 NOTE — Assessment & Plan Note (Signed)
Check labs con't synthroid 

## 2019-06-05 ENCOUNTER — Encounter: Payer: Self-pay | Admitting: Family Medicine

## 2019-06-05 ENCOUNTER — Other Ambulatory Visit: Payer: Self-pay | Admitting: Family Medicine

## 2019-06-05 DIAGNOSIS — E781 Pure hyperglyceridemia: Secondary | ICD-10-CM

## 2019-06-05 DIAGNOSIS — R739 Hyperglycemia, unspecified: Secondary | ICD-10-CM

## 2019-06-05 NOTE — Progress Notes (Signed)
Virtual Visit via Video Note  I connected with patient on 06/06/19 at  8:00 AM EDT by audio enabled telemedicine application and verified that I am speaking with the correct person using two identifiers.   THIS ENCOUNTER IS A VIRTUAL VISIT DUE TO COVID-19 - PATIENT WAS NOT SEEN IN THE OFFICE. PATIENT HAS CONSENTED TO VIRTUAL VISIT / TELEMEDICINE VISIT   Location of patient: home  Location of provider: office  I discussed the limitations of evaluation and management by telemedicine and the availability of in person appointments. The patient expressed understanding and agreed to proceed.   Subjective:   Wendy Velazquez is a 78 y.o. female who presents for Medicare Annual (Subsequent) preventive examination.  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  Review of Systems: No ROS.  Medicare Wellness Virtual Visit.  Visual/audio telehealth visit, UTA vital signs.   See social history for additional risk factors. Cardiac Risk Factors include: advanced age (>105men, >90 women);hypertension Sleep patterns: no issues Home Safety/Smoke Alarms: Feels safe in home. Smoke alarms in place.  Lives alone in 1 story town home. Walk - in shower.    Female:       Mammo-  Pt declines at this time.     Dexa scan- active order       CCS- Cologuard 05/23/17-neg     Objective:     Vitals: BP 126/77 Comment: pt reported    Advanced Directives 06/06/2019 05/05/2018 05/04/2017 04/21/2016  Does Patient Have a Medical Advance Directive? Yes Yes Yes Yes  Type of Paramedic of [place];Living will [place];Living will [place];Living will [place];Living will  Does patient want to make changes to medical advance directive? No - Patient declined - - No - Patient declined  Copy of [place] in Chart? No - copy requested No - copy requested No - copy requested No - copy requested     Tobacco Social History   Tobacco Use  Smoking Status Former Smoker  . Packs/day: 0.30  . Years: 15.00  . Pack years: 4.50  . Quit date: 03/15/1981  . Years since quitting: 38.2  Smokeless Tobacco Never Used     Counseling given: Not Answered   Clinical Intake: Pain : No/denies pain   Past Medical History:  Diagnosis Date  . Arthritis   . Hypertension   . Melanoma ([place])   . MRSA (methicillin resistant staph aureus) culture positive   . Osteopenia   . Rapid heart beat    benigh  . Transitional cell carcinoma [facility])    Past Surgical History:  Procedure Laterality Date  . ABDOMINAL HYSTERECTOMY  1987   partial  . BACK SURGERY     08/02/2018  . CATARACT EXTRACTION  2004  . EYE SURGERY     cataract b/l  . MELANOMA EXCISION  1977   left leg  . MELANOMA EXCISION  06-24-09   r foot  . SKIN CANCER EXCISION  1999   bladder transitional cell  . teeth implants    . TONSILLECTOMY     Family History  Problem Relation Age of Onset  . Diabetes Mother   . Alzheimer's disease Mother   . Dementia Mother   . Stroke Father   . Hypertension Father    Social History   Socioeconomic History  . Marital status: Widowed    Spouse name: Not on file  . Number of children: Not on  file  . Years of education: Not on file  . Highest education level: Not on file  Occupational History  . Occupation: Product/process development scientist: [employer]    Comment: 4 days a week  Social Needs  . Financial resource strain: Not on file  . Food insecurity    Worry: Not on file    Inability: Not on file  . Transportation needs    Medical: Not on file    Non-medical: Not on file  Tobacco Use  . Smoking status: Former Smoker    Packs/day: 0.30    Years: 15.00    Pack years: 4.50    Quit date: 03/15/1981    Years since quitting: 38.2  . Smokeless tobacco: Never Used  Substance and Sexual Activity  . Alcohol use: Yes    Alcohol/week: 1.0 - 2.0 standard drinks    Types: 1 - 2 Glasses of  wine per week  . Drug use: No  . Sexual activity: Not Currently    Partners: Male  Lifestyle  . Physical activity    Days per week: Not on file    Minutes per session: Not on file  . Stress: Not on file  Relationships  . Social Herbalist on phone: Not on file    Gets together: Not on file    Attends religious service: Not on file    Active member of club or organization: Not on file    Attends meetings of clubs or organizations: Not on file    Relationship status: Not on file  Other Topics Concern  . Not on file  Social History Narrative   Exercise--- dancing    Outpatient Encounter Medications as of 06/06/2019  Medication Sig  . acetaminophen (TYLENOL ARTHRITIS PAIN) 650 MG CR tablet Take 650 mg by mouth 3 (three) times daily as needed.   Marland Kitchen amLODipine (NORVASC) 5 MG tablet Take 1 tablet (5 mg total) by mouth daily.  . betamethasone dipropionate (DIPROLENE) 0.05 % cream Apply topically 2 (two) times daily. Use thin layer on affected skin areas  . Digestive Enzymes (ENZYMATIC DIGESTANT PO) Take by mouth. Enzymatic Therapy probiotic pearls  . estradiol (ESTRACE) 0.5 MG tablet Take 1 tablet (0.5 mg total) by mouth daily.  . fluticasone (FLONASE) 50 MCG/ACT nasal spray Place 2 sprays into both nostrils daily.  Donnie Aho (GLUCOSAMINE MSM COMPLEX PO) Take 1 tablet by mouth daily.  . GuaiFENesin (MUCINEX PO) Take 200 mg by mouth at bedtime.  . medroxyPROGESTERone (PROVERA) 5 MG tablet 1 tab po qd 10 days a month as directed.  Brand name necessary.  . meloxicam (MOBIC) 15 MG tablet 1 po qd prn  . propranolol (INDERAL) 10 MG tablet Take 1 tablet (10 mg total) by mouth 2 (two) times daily.  Marland Kitchen spironolactone (ALDACTONE) 25 MG tablet 1/2 tab po qd  . SYNTHROID 75 MCG tablet Take 1 tablet (75 mcg total) by mouth daily.   No facility-administered encounter medications on file as of 06/06/2019.     Activities of Daily Living In your present state of health,  do you have any difficulty performing the following activities: 06/06/2019 06/02/2019  Hearing? Tempie Donning  Comment has hearing aids bilateral -  Vision? N N  Difficulty concentrating or making decisions? N N  Walking or climbing stairs? N N  Dressing or bathing? N N  Doing errands, shopping? N N  Preparing Food and eating ? N -  Using the Toilet? N -  In the past six months, have you accidently leaked urine? N -  Do you have problems with loss of bowel control? N -  Managing your Medications? N -  Managing your Finances? N -  Housekeeping or managing your Housekeeping? N -  Some recent data might be hidden    Patient Care Team: Carollee Herter, Alferd Apa, DO as PCP - General Luberta Mutter, MD as Consulting Physician (Ophthalmology) Myrlene Broker, MD as Attending Physician (Urology) Rolm Bookbinder, MD as Consulting Physician (Dermatology) Myrle Sheng, MD as Referring Physician (Neurosurgery)    Assessment:   This is a routine wellness examination for [place]. Physical assessment deferred to PCP.  Exercise Activities and Dietary recommendations Current Exercise Habits: The patient does not participate in regular exercise at present, Exercise limited by: None identified Diet (meal preparation, eat out, water intake, caffeinated beverages, dairy products, fruits and vegetables): in general, a "healthy" diet  , on average, 3 meals per day   Goals    . Maintain current health       Fall Risk Fall Risk  06/06/2019 05/05/2018 05/04/2017 04/21/2016 08/16/2015  Falls in the past year? 0 No No No No    Depression Screen PHQ 2/9 Scores 06/06/2019 05/05/2018 05/04/2017 04/21/2016  PHQ - 2 Score 0 0 0 0  Exception Documentation - - - -     Cognitive Function Ad8 score reviewed for issues:  Issues making decisions:no  Less interest in hobbies / activities:no  Repeats questions, stories (family complaining): no  Trouble using ordinary gadgets (microwave, computer, phone):no   Forgets the month or year: no  Mismanaging finances: no  Remembering appts:no  Daily problems with thinking and/or memory:no Ad8 score is=0  MMSE - Mini Mental State Exam 05/04/2017 04/21/2016  Orientation to time 5 5  Orientation to Place 5 5  Registration 3 3  Attention/ Calculation 5 5  Recall 3 3  Language- name 2 objects 2 2  Language- repeat 1 1  Language- follow 3 step command 3 3  Language- read & follow direction 1 1  Write a sentence 1 1  Copy design 1 1  Total score 30 30        Immunization History  Administered Date(s) Administered  . DTaP 06/05/2013  . Influenza Whole 09/12/2008  . Influenza, High Dose Seasonal PF 08/29/2014, 07/12/2018  . Influenza-Unspecified 11/30/2012, 09/16/2013, 08/17/2016  . PPD Test 02/11/2016, 02/18/2016  . Pneumococcal Conjugate-13 04/21/2016  . Pneumococcal Polysaccharide-23 03/02/2008  . Td 11/29/2002  . Tdap 06/05/2013  . Zoster 03/02/2008    Screening Tests Health Maintenance  Topic Date Due  . MAMMOGRAM  02/13/2015  . INFLUENZA VACCINE  06/17/2019  . TETANUS/TDAP  06/06/2023  . DEXA SCAN  Completed  . PNA vac Low Risk Adult  Completed      Plan:   See you next year!  Continue to eat heart healthy diet (full of fruits, vegetables, whole grains, lean protein, water--limit salt, fat, and sugar intake) and increase physical activity as tolerated.  Continue doing brain stimulating activities (puzzles, reading, adult coloring books, staying active) to keep memory sharp.   Bring a copy of your living will and/or healthcare power of attorney to your next office visit.    I have personally reviewed and noted the following in the patient's chart:   . Medical and social history . Use of alcohol, tobacco or illicit drugs  . Current medications and supplements . Functional ability and status . Nutritional status . Physical  activity . Advanced directives . List of other physicians . Hospitalizations, surgeries, and ER  visits in previous 12 months . Vitals . Screenings to include cognitive, depression, and falls . Referrals and appointments  In addition, I have reviewed and discussed with patient certain preventive protocols, quality metrics, and best practice recommendations. A written personalized care plan for preventive services as well as general preventive health recommendations were provided to patient.     Naaman Plummer [place], [place]  06/06/2019

## 2019-06-06 ENCOUNTER — Ambulatory Visit (INDEPENDENT_AMBULATORY_CARE_PROVIDER_SITE_OTHER): Payer: Medicare Other | Admitting: *Deleted

## 2019-06-06 ENCOUNTER — Other Ambulatory Visit: Payer: Self-pay

## 2019-06-06 ENCOUNTER — Encounter: Payer: Self-pay | Admitting: *Deleted

## 2019-06-06 VITALS — BP 126/77

## 2019-06-06 DIAGNOSIS — Z0000 Encounter for general adult medical examination without abnormal findings: Secondary | ICD-10-CM

## 2019-06-06 NOTE — Patient Instructions (Signed)
See you next year!  Continue to eat heart healthy diet (full of fruits, vegetables, whole grains, lean protein, water--limit salt, fat, and sugar intake) and increase physical activity as tolerated.  Continue doing brain stimulating activities (puzzles, reading, adult coloring books, staying active) to keep memory sharp.   Bring a copy of your living will and/or healthcare power of attorney to your next office visit.   Wendy Velazquez , Thank you for taking time to come for your Medicare Wellness Visit. I appreciate your ongoing commitment to your health goals. Please review the following plan we discussed and let me know if I can assist you in the future.   These are the goals we discussed: Goals    . Maintain current health       This is a list of the screening recommended for you and due dates:  Health Maintenance  Topic Date Due  . Mammogram  02/13/2015  . Flu Shot  06/17/2019  . Tetanus Vaccine  06/06/2023  . DEXA scan (bone density measurement)  Completed  . Pneumonia vaccines  Completed    Health Maintenance After Age 57 After age 64, you are at a higher risk for certain long-term diseases and infections as well as injuries from falls. Falls are a major cause of broken bones and head injuries in people who are older than age 105. Getting regular preventive care can help to keep you healthy and well. Preventive care includes getting regular testing and making lifestyle changes as recommended by your health care provider. Talk with your health care provider about:  Which screenings and tests you should have. A screening is a test that checks for a disease when you have no symptoms.  A diet and exercise plan that is right for you. What should I know about screenings and tests to prevent falls? Screening and testing are the best ways to find a health problem early. Early diagnosis and treatment give you the best chance of managing medical conditions that are common after age 70.  Certain conditions and lifestyle choices may make you more likely to have a fall. Your health care provider may recommend:  Regular vision checks. Poor vision and conditions such as cataracts can make you more likely to have a fall. If you wear glasses, make sure to get your prescription updated if your vision changes.  Medicine review. Work with your health care provider to regularly review all of the medicines you are taking, including over-the-counter medicines. Ask your health care provider about any side effects that may make you more likely to have a fall. Tell your health care provider if any medicines that you take make you feel dizzy or sleepy.  Osteoporosis screening. Osteoporosis is a condition that causes the bones to get weaker. This can make the bones weak and cause them to break more easily.  Blood pressure screening. Blood pressure changes and medicines to control blood pressure can make you feel dizzy.  Strength and balance checks. Your health care provider may recommend certain tests to check your strength and balance while standing, walking, or changing positions.  Foot health exam. Foot pain and numbness, as well as not wearing proper footwear, can make you more likely to have a fall.  Depression screening. You may be more likely to have a fall if you have a fear of falling, feel emotionally low, or feel unable to do activities that you used to do.  Alcohol use screening. Using too much alcohol can affect your balance  and may make you more likely to have a fall. What actions can I take to lower my risk of falls? General instructions  Talk with your health care provider about your risks for falling. Tell your health care provider if: ? You fall. Be sure to tell your health care provider about all falls, even ones that seem minor. ? You feel dizzy, sleepy, or off-balance.  Take over-the-counter and prescription medicines only as told by your health care provider. These  include any supplements.  Eat a healthy diet and maintain a healthy weight. A healthy diet includes low-fat dairy products, low-fat (lean) meats, and fiber from whole grains, beans, and lots of fruits and vegetables. Home safety  Remove any tripping hazards, such as rugs, cords, and clutter.  Install safety equipment such as grab bars in bathrooms and safety rails on stairs.  Keep rooms and walkways well-lit. Activity   Follow a regular exercise program to stay fit. This will help you maintain your balance. Ask your health care provider what types of exercise are appropriate for you.  If you need a cane or walker, use it as recommended by your health care provider.  Wear supportive shoes that have nonskid soles. Lifestyle  Do not drink alcohol if your health care provider tells you not to drink.  If you drink alcohol, limit how much you have: ? 0-1 drink a day for women. ? 0-2 drinks a day for men.  Be aware of how much alcohol is in your drink. In the [place] one drink equals one typical bottle of beer (12 oz), one-half glass of wine (5 oz), or one shot of hard liquor (1 oz).  Do not use any products that contain nicotine or tobacco, such as cigarettes and e-cigarettes. If you need help quitting, ask your health care provider. Summary  Having a healthy lifestyle and getting preventive care can help to protect your health and wellness after age 50.  Screening and testing are the best way to find a health problem early and help you avoid having a fall. Early diagnosis and treatment give you the best chance for managing medical conditions that are more common for people who are older than age 46.  Falls are a major cause of broken bones and head injuries in people who are older than age 69. Take precautions to prevent a fall at home.  Work with your health care provider to learn what changes you can make to improve your health and wellness and to prevent falls. This information is  not intended to replace advice given to you by your health care provider. Make sure you discuss any questions you have with your health care provider. Document Released: 09/15/2017 Document Revised: 02/23/2019 Document Reviewed: 09/15/2017 Elsevier Patient Education  2020 [employer].

## 2019-06-14 ENCOUNTER — Other Ambulatory Visit: Payer: Self-pay

## 2019-06-14 ENCOUNTER — Ambulatory Visit (HOSPITAL_BASED_OUTPATIENT_CLINIC_OR_DEPARTMENT_OTHER)
Admission: RE | Admit: 2019-06-14 | Discharge: 2019-06-14 | Disposition: A | Discharge status: Home / Self Care | Payer: Medicare Other | Source: Ambulatory Visit | Attending: Family Medicine | Admitting: Family Medicine

## 2019-06-14 DIAGNOSIS — M5136 Other intervertebral disc degeneration, lumbar region: Secondary | ICD-10-CM | POA: Diagnosis not present

## 2019-06-14 DIAGNOSIS — E2839 Other primary ovarian failure: Secondary | ICD-10-CM | POA: Diagnosis not present

## 2019-06-14 DIAGNOSIS — G8929 Other chronic pain: Secondary | ICD-10-CM | POA: Diagnosis not present

## 2019-06-14 DIAGNOSIS — M5416 Radiculopathy, lumbar region: Secondary | ICD-10-CM | POA: Diagnosis not present

## 2019-06-14 DIAGNOSIS — Z780 Asymptomatic menopausal state: Secondary | ICD-10-CM | POA: Diagnosis not present

## 2019-06-14 DIAGNOSIS — M5441 Lumbago with sciatica, right side: Secondary | ICD-10-CM | POA: Diagnosis not present

## 2019-06-14 DIAGNOSIS — M85851 Other specified disorders of bone density and structure, right thigh: Secondary | ICD-10-CM | POA: Diagnosis not present

## 2019-06-14 DIAGNOSIS — M47816 Spondylosis without myelopathy or radiculopathy, lumbar region: Secondary | ICD-10-CM | POA: Diagnosis not present

## 2019-06-14 DIAGNOSIS — Z01818 Encounter for other preprocedural examination: Secondary | ICD-10-CM | POA: Diagnosis not present

## 2019-06-17 ENCOUNTER — Encounter: Payer: Self-pay | Admitting: Family Medicine

## 2019-06-20 NOTE — Telephone Encounter (Signed)
Noted Pt doesn't want new medication. Pt has virtual appointment tomorrow

## 2019-06-21 ENCOUNTER — Ambulatory Visit: Payer: Medicare Other | Admitting: Family Medicine

## 2019-06-28 DIAGNOSIS — E559 Vitamin D deficiency, unspecified: Secondary | ICD-10-CM | POA: Diagnosis not present

## 2019-06-28 DIAGNOSIS — Z87891 Personal history of nicotine dependence: Secondary | ICD-10-CM | POA: Diagnosis not present

## 2019-06-28 DIAGNOSIS — Z79899 Other long term (current) drug therapy: Secondary | ICD-10-CM | POA: Diagnosis not present

## 2019-06-28 DIAGNOSIS — M8580 Other specified disorders of bone density and structure, unspecified site: Secondary | ICD-10-CM | POA: Diagnosis not present

## 2019-07-03 DIAGNOSIS — M48061 Spinal stenosis, lumbar region without neurogenic claudication: Secondary | ICD-10-CM | POA: Diagnosis not present

## 2019-07-03 DIAGNOSIS — M5416 Radiculopathy, lumbar region: Secondary | ICD-10-CM | POA: Diagnosis not present

## 2019-07-03 DIAGNOSIS — M47816 Spondylosis without myelopathy or radiculopathy, lumbar region: Secondary | ICD-10-CM | POA: Diagnosis not present

## 2019-07-03 DIAGNOSIS — M5136 Other intervertebral disc degeneration, lumbar region: Secondary | ICD-10-CM | POA: Diagnosis not present

## 2019-07-04 ENCOUNTER — Encounter: Payer: Self-pay | Admitting: Family Medicine

## 2019-07-04 NOTE — Telephone Encounter (Signed)
I know [place] [facility] does spinal stimulators but I don't know if it s that one  She would have to ask that specifically

## 2019-07-04 NOTE — Telephone Encounter (Signed)
I really

## 2019-07-11 ENCOUNTER — Other Ambulatory Visit: Payer: Self-pay

## 2019-07-11 ENCOUNTER — Ambulatory Visit (INDEPENDENT_AMBULATORY_CARE_PROVIDER_SITE_OTHER): Payer: Medicare Other | Admitting: *Deleted

## 2019-07-11 DIAGNOSIS — Z23 Encounter for immunization: Secondary | ICD-10-CM

## 2019-07-11 NOTE — Progress Notes (Signed)
Patient here for high dose flu vaccine.  Vaccine given and patient tolerated well. 

## 2019-07-23 ENCOUNTER — Encounter: Payer: Self-pay | Admitting: Family Medicine

## 2019-07-25 ENCOUNTER — Encounter: Payer: Self-pay | Admitting: Family Medicine

## 2019-07-25 ENCOUNTER — Ambulatory Visit (INDEPENDENT_AMBULATORY_CARE_PROVIDER_SITE_OTHER): Payer: Medicare Other | Admitting: Family Medicine

## 2019-07-25 ENCOUNTER — Other Ambulatory Visit: Payer: Self-pay

## 2019-07-25 ENCOUNTER — Ambulatory Visit: Payer: Medicare Other

## 2019-07-25 DIAGNOSIS — M8589 Other specified disorders of bone density and structure, multiple sites: Secondary | ICD-10-CM | POA: Diagnosis not present

## 2019-07-25 NOTE — Assessment & Plan Note (Signed)
Pt refuses to stop hrt--- she understands the risks of staying on it but was told by her gyn years ago to stay on it  She does not want fosamax because of side effects  she will con't weight bearing exercise con't calcium in diet and vita d supp Recheck bmd in 2 years

## 2019-07-25 NOTE — Progress Notes (Signed)
Virtual Visit via Video Note  I connected with [name] on 07/25/19 at 10:20 AM EDT by a video enabled telemedicine application and verified that I am speaking with the correct person using two identifiers.  Location: Patient: home  Provider: office   I discussed the limitations of evaluation and management by telemedicine and the availability of in person appointments. The patient expressed understanding and agreed to proceed.  History of Present Illness: Pt is home and had multiple questions about her fosamax, bone density and vita d and calicum  She eats a lot of food with calcium in it and exercises a lot.     Observations/Objective: No vitals obtained today Pt is in NAD  Assessment and Plan: 1. Osteopenia of multiple sites *Pt refuses to stop hrt--- she understands the risks of staying on it but was told by her gyn years ago to stay on it  She does not want fosamax because of side effects  she will con't weight bearing exercise con't calcium in diet and vita d supp Recheck bmd in 2 years     Follow Up Instructions:    I discussed the assessment and treatment plan with the patient. The patient was provided an opportunity to ask questions and all were answered. The patient agreed with the plan and demonstrated an understanding of the instructions.   The patient was advised to call back or seek an in-person evaluation if the symptoms worsen or if the condition fails to improve as anticipated.  I provided 15 minutes of non-face-to-face time during this encounter.   Ann Held, DO

## 2019-07-31 DIAGNOSIS — M47816 Spondylosis without myelopathy or radiculopathy, lumbar region: Secondary | ICD-10-CM | POA: Diagnosis not present

## 2019-07-31 DIAGNOSIS — M5136 Other intervertebral disc degeneration, lumbar region: Secondary | ICD-10-CM | POA: Diagnosis not present

## 2019-07-31 DIAGNOSIS — M8588 Other specified disorders of bone density and structure, other site: Secondary | ICD-10-CM | POA: Diagnosis not present

## 2019-07-31 DIAGNOSIS — M5416 Radiculopathy, lumbar region: Secondary | ICD-10-CM | POA: Diagnosis not present

## 2019-08-10 DIAGNOSIS — M8589 Other specified disorders of bone density and structure, multiple sites: Secondary | ICD-10-CM | POA: Diagnosis not present

## 2019-08-10 DIAGNOSIS — Z7189 Other specified counseling: Secondary | ICD-10-CM | POA: Diagnosis not present

## 2019-08-10 DIAGNOSIS — M810 Age-related osteoporosis without current pathological fracture: Secondary | ICD-10-CM | POA: Diagnosis not present

## 2019-08-10 DIAGNOSIS — E559 Vitamin D deficiency, unspecified: Secondary | ICD-10-CM | POA: Diagnosis not present

## 2019-08-11 DIAGNOSIS — L603 Nail dystrophy: Secondary | ICD-10-CM | POA: Diagnosis not present

## 2019-08-11 DIAGNOSIS — Z85820 Personal history of malignant melanoma of skin: Secondary | ICD-10-CM | POA: Diagnosis not present

## 2019-08-11 DIAGNOSIS — D225 Melanocytic nevi of trunk: Secondary | ICD-10-CM | POA: Diagnosis not present

## 2019-08-11 DIAGNOSIS — L821 Other seborrheic keratosis: Secondary | ICD-10-CM | POA: Diagnosis not present

## 2019-08-11 DIAGNOSIS — L814 Other melanin hyperpigmentation: Secondary | ICD-10-CM | POA: Diagnosis not present

## 2019-08-11 DIAGNOSIS — D2372 Other benign neoplasm of skin of left lower limb, including hip: Secondary | ICD-10-CM | POA: Diagnosis not present

## 2019-08-11 DIAGNOSIS — D1801 Hemangioma of skin and subcutaneous tissue: Secondary | ICD-10-CM | POA: Diagnosis not present

## 2019-08-15 ENCOUNTER — Ambulatory Visit (INDEPENDENT_AMBULATORY_CARE_PROVIDER_SITE_OTHER): Payer: Medicare Other | Admitting: Family Medicine

## 2019-08-15 ENCOUNTER — Encounter: Payer: Self-pay | Admitting: Family Medicine

## 2019-08-15 ENCOUNTER — Emergency Department (HOSPITAL_BASED_OUTPATIENT_CLINIC_OR_DEPARTMENT_OTHER): Payer: Medicare Other

## 2019-08-15 ENCOUNTER — Inpatient Hospital Stay (HOSPITAL_BASED_OUTPATIENT_CLINIC_OR_DEPARTMENT_OTHER)
Admission: EM | Admit: 2019-08-15 | Discharge: 2019-08-17 | DRG: 244 | Disposition: A | Discharge status: Home / Self Care | Payer: Medicare Other | Attending: Internal Medicine | Admitting: Internal Medicine

## 2019-08-15 ENCOUNTER — Encounter (HOSPITAL_BASED_OUTPATIENT_CLINIC_OR_DEPARTMENT_OTHER): Payer: Self-pay | Admitting: Emergency Medicine

## 2019-08-15 ENCOUNTER — Other Ambulatory Visit: Payer: Self-pay

## 2019-08-15 VITALS — BP 144/80 | HR 41 | Temp 97.8°F | Resp 18 | Ht 63.0 in | Wt 171.2 lb

## 2019-08-15 DIAGNOSIS — R0602 Shortness of breath: Secondary | ICD-10-CM | POA: Diagnosis not present

## 2019-08-15 DIAGNOSIS — Z888 Allergy status to other drugs, medicaments and biological substances status: Secondary | ICD-10-CM | POA: Diagnosis not present

## 2019-08-15 DIAGNOSIS — Z79899 Other long term (current) drug therapy: Secondary | ICD-10-CM

## 2019-08-15 DIAGNOSIS — Z85820 Personal history of malignant melanoma of skin: Secondary | ICD-10-CM

## 2019-08-15 DIAGNOSIS — Z87891 Personal history of nicotine dependence: Secondary | ICD-10-CM

## 2019-08-15 DIAGNOSIS — Z90711 Acquired absence of uterus with remaining cervical stump: Secondary | ICD-10-CM

## 2019-08-15 DIAGNOSIS — R001 Bradycardia, unspecified: Secondary | ICD-10-CM | POA: Diagnosis present

## 2019-08-15 DIAGNOSIS — Z823 Family history of stroke: Secondary | ICD-10-CM

## 2019-08-15 DIAGNOSIS — Z9841 Cataract extraction status, right eye: Secondary | ICD-10-CM

## 2019-08-15 DIAGNOSIS — Z9842 Cataract extraction status, left eye: Secondary | ICD-10-CM | POA: Diagnosis not present

## 2019-08-15 DIAGNOSIS — Z950 Presence of cardiac pacemaker: Secondary | ICD-10-CM

## 2019-08-15 DIAGNOSIS — Z8550 Personal history of malignant neoplasm of unspecified urinary tract organ: Secondary | ICD-10-CM

## 2019-08-15 DIAGNOSIS — Z20828 Contact with and (suspected) exposure to other viral communicable diseases: Secondary | ICD-10-CM | POA: Diagnosis present

## 2019-08-15 DIAGNOSIS — E039 Hypothyroidism, unspecified: Secondary | ICD-10-CM | POA: Diagnosis present

## 2019-08-15 DIAGNOSIS — I351 Nonrheumatic aortic (valve) insufficiency: Secondary | ICD-10-CM | POA: Diagnosis not present

## 2019-08-15 DIAGNOSIS — I4510 Unspecified right bundle-branch block: Secondary | ICD-10-CM | POA: Diagnosis present

## 2019-08-15 DIAGNOSIS — Z885 Allergy status to narcotic agent status: Secondary | ICD-10-CM

## 2019-08-15 DIAGNOSIS — Z791 Long term (current) use of non-steroidal anti-inflammatories (NSAID): Secondary | ICD-10-CM

## 2019-08-15 DIAGNOSIS — Z8614 Personal history of Methicillin resistant Staphylococcus aureus infection: Secondary | ICD-10-CM | POA: Diagnosis not present

## 2019-08-15 DIAGNOSIS — I10 Essential (primary) hypertension: Secondary | ICD-10-CM | POA: Diagnosis present

## 2019-08-15 DIAGNOSIS — Z79890 Hormone replacement therapy (postmenopausal): Secondary | ICD-10-CM | POA: Diagnosis not present

## 2019-08-15 DIAGNOSIS — Z820 Family history of epilepsy and other diseases of the nervous system: Secondary | ICD-10-CM

## 2019-08-15 DIAGNOSIS — I361 Nonrheumatic tricuspid (valve) insufficiency: Secondary | ICD-10-CM | POA: Diagnosis not present

## 2019-08-15 DIAGNOSIS — M8580 Other specified disorders of bone density and structure, unspecified site: Secondary | ICD-10-CM | POA: Diagnosis present

## 2019-08-15 DIAGNOSIS — Z8249 Family history of ischemic heart disease and other diseases of the circulatory system: Secondary | ICD-10-CM | POA: Diagnosis not present

## 2019-08-15 DIAGNOSIS — I441 Atrioventricular block, second degree: Secondary | ICD-10-CM | POA: Diagnosis present

## 2019-08-15 DIAGNOSIS — M1990 Unspecified osteoarthritis, unspecified site: Secondary | ICD-10-CM | POA: Diagnosis present

## 2019-08-15 DIAGNOSIS — Z833 Family history of diabetes mellitus: Secondary | ICD-10-CM | POA: Diagnosis not present

## 2019-08-15 DIAGNOSIS — I442 Atrioventricular block, complete: Secondary | ICD-10-CM | POA: Diagnosis not present

## 2019-08-15 HISTORY — DX: Atrioventricular block, second degree: I44.1

## 2019-08-15 LAB — TROPONIN I (HIGH SENSITIVITY)
Troponin I (High Sensitivity): 13 ng/L (ref <18)
Troponin I (High Sensitivity): 15 ng/L (ref <18)

## 2019-08-15 LAB — CBC WITH DIFFERENTIAL/PLATELET
Abs Immature Granulocytes: 0.01 10*3/uL (ref 0.00–0.07)
Basophils Absolute: 0 10*3/uL (ref 0.0–0.1)
Basophils Relative: 1 %
Eosinophils Absolute: 0.1 10*3/uL (ref 0.0–0.5)
Eosinophils Relative: 2 %
HCT: 44.1 % (ref 36.0–46.0)
Hemoglobin: 14.1 g/dL (ref 12.0–15.0)
Immature Granulocytes: 0 %
Lymphocytes Relative: 28 %
Lymphs Abs: 1.8 10*3/uL (ref 0.7–4.0)
MCH: 30.3 pg (ref 26.0–34.0)
MCHC: 32 g/dL (ref 30.0–36.0)
MCV: 94.8 fL (ref 80.0–100.0)
Monocytes Absolute: 0.6 10*3/uL (ref 0.1–1.0)
Monocytes Relative: 9 %
Neutro Abs: 4.1 10*3/uL (ref 1.7–7.7)
Neutrophils Relative %: 60 %
Platelets: 199 10*3/uL (ref 150–400)
RBC: 4.65 MIL/uL (ref 3.87–5.11)
RDW: 13 % (ref 11.5–15.5)
WBC: 6.6 10*3/uL (ref 4.0–10.5)
nRBC: 0 % (ref 0.0–0.2)

## 2019-08-15 LAB — BASIC METABOLIC PANEL
Anion gap: 13 (ref 5–15)
BUN: 14 mg/dL (ref 8–23)
CO2: 23 mmol/L (ref 22–32)
Calcium: 9.4 mg/dL (ref 8.9–10.3)
Chloride: 103 mmol/L (ref 98–111)
Creatinine, Ser: 0.99 mg/dL (ref 0.44–1.00)
GFR calc Af Amer: >60 mL/min (ref >60)
GFR calc non Af Amer: 55 mL/min — ABNORMAL LOW (ref >60)
Glucose, Bld: 132 mg/dL — ABNORMAL HIGH (ref 70–99)
Potassium: 3.6 mmol/L (ref 3.5–5.1)
Sodium: 139 mmol/L (ref 135–145)

## 2019-08-15 LAB — SARS CORONAVIRUS 2 BY RT PCR (HOSPITAL ORDER, PERFORMED IN ~~LOC~~ HOSPITAL LAB): SARS Coronavirus 2: NEGATIVE

## 2019-08-15 LAB — TSH: TSH: 1.74 u[IU]/mL (ref 0.350–4.500)

## 2019-08-15 MED ORDER — ACETAMINOPHEN 325 MG PO TABS
650.0000 mg | ORAL_TABLET | Freq: Four times a day (QID) | ORAL | Status: DC | PRN
  Administered 2019-08-15: 650 mg via ORAL
  Filled 2019-08-15: qty 2

## 2019-08-15 NOTE — ED Provider Notes (Signed)
[facility] Provider Note   CSN: [id number] Arrival date & time: 08/15/19  1550     History   Chief Complaint Chief Complaint  Patient presents with  . Bradycardia    HPI Wendy Velazquez is a 78 y.o. female.     78 year old female with past medical history including hypertension, melanoma, osteopenia, benign tachycardia who presents with slow heart rate.  Patient states that 1 week ago she began noticing that her heart rate was low, 30s to 40s on her heart monitor at home.  She has had some shortness of breath and fatigue with exertion.  No shortness of breath at rest and denies any associated chest pain, lightheadedness/near syncope, cough, fevers, vomiting, or other complaints.  She went to PCP today and was noted to be bradycardic in the clinic and was sent here for further evaluation.  Approximately 30 years ago, she was evaluated by a cardiologist for tachycardia and was started on propranolol.  She has been on this medication ever since.  Over the weekend, she stopped taking the medication.  She had a quarter of a pill this morning.  No new medications or changes to her medications recently.  No tick bites.  The history is provided by the patient.    Past Medical History:  Diagnosis Date  . Arthritis   . Hypertension   . Melanoma ([place])   . MRSA (methicillin resistant staph aureus) culture positive   . Osteopenia   . Rapid heart beat    benigh  . Transitional cell carcinoma [facility])     Patient Active Problem List   Diagnosis Date Noted  . Bradycardia 08/15/2019  . Mobitz type II atrioventricular block 08/15/2019  . Osteopenia of multiple sites 07/25/2019  . Menopause 06/02/2019  . Postmenopausal 05/05/2018  . Primary osteoarthritis involving multiple joints 05/05/2018  . Chronic right-sided low back pain with right-sided sciatica 05/05/2018  . Spider veins of both lower extremities 08/11/2017  . Acute sinus infection 10/24/2014  .  Obesity (BMI 30-39.9) 01/01/2014  . Hx of cyst of breast 07/21/2012  . MELANOMA, FOOT, RIGHT 03/13/2010  . OTHER ACUTE REACTIONS TO STRESS 06/20/2008  . NEOPLASM, MALIGNANT, BLADDER, TRANSITIONAL CELL 03/02/2008  . Hypothyroidism 03/02/2008  . HEARING LOSS, BILATERAL 03/02/2008  . Essential hypertension 03/02/2008  . POSTMENOPAUSAL STATUS 03/02/2008  . MELANOMA, LEG, HX OF 03/02/2008  . URI 09/23/2007  . DERMATITIS, CONTACT, NEC 07/28/2007  . ABSCESS, SKIN 07/20/2007  . Acute sinusitis, unspecified 05/19/2007    Past Surgical History:  Procedure Laterality Date  . ABDOMINAL HYSTERECTOMY  1987   partial  . BACK SURGERY     08/02/2018  . CATARACT EXTRACTION  2004  . EYE SURGERY     cataract b/l  . MELANOMA EXCISION  1977   left leg  . MELANOMA EXCISION  06-24-09   r foot  . SKIN CANCER EXCISION  1999   bladder transitional cell  . teeth implants    . TONSILLECTOMY       OB History   No obstetric history on file.      Home Medications    Prior to Admission medications   Medication Sig Start Date End Date Taking? Authorizing Provider  acetaminophen (TYLENOL ARTHRITIS PAIN) 650 MG CR tablet Take 650 mg by mouth 3 (three) times daily as needed.     [provider]  amLODipine (NORVASC) 5 MG tablet Take 1 tablet (5 mg total) by mouth daily. 06/02/19   Carollee Herter,  Alferd Apa, DO  betamethasone dipropionate (DIPROLENE) 0.05 % cream Apply topically 2 (two) times daily. Use thin layer on affected skin areas 12/10/18   Jodelle Green, FNP  Digestive Enzymes (ENZYMATIC DIGESTANT PO) Take by mouth. Enzymatic Therapy probiotic pearls    [provider]  estradiol (ESTRACE) 0.5 MG tablet Take 1 tablet (0.5 mg total) by mouth daily. 06/02/19   Roma Schanz R, DO  fluticasone (FLONASE) 50 MCG/ACT nasal spray Place 2 sprays into both nostrils daily. 12/24/15   Saguier, Percell Miller, PA-C  Glucos-MSM-C-Mn-Ginger-Willow (GLUCOSAMINE MSM COMPLEX PO) Take 1 tablet by mouth  daily.    [provider]  GuaiFENesin (MUCINEX PO) Take 200 mg by mouth at bedtime.    [provider]  medroxyPROGESTERone (PROVERA) 5 MG tablet 1 tab po qd 10 days a month as directed.  Brand name necessary. 06/02/19   Ann Held, DO  meloxicam (MOBIC) 15 MG tablet 1 po qd prn 06/02/19   Carollee Herter, Alferd Apa, DO  propranolol (INDERAL) 10 MG tablet Take 1 tablet (10 mg total) by mouth 2 (two) times daily. 06/02/19   Roma Schanz R, DO  spironolactone (ALDACTONE) 25 MG tablet 1/2 tab po qd 06/02/19   Carollee Herter, Yvonne R, DO  SYNTHROID 75 MCG tablet Take 1 tablet (75 mcg total) by mouth daily. 06/02/19   Ann Held, DO    Family History Family History  Problem Relation Age of Onset  . Diabetes Mother   . Alzheimer's disease Mother   . Dementia Mother   . Stroke Father   . Hypertension Father     Social History Social History   Tobacco Use  . Smoking status: Former Smoker    Packs/day: 0.30    Years: 15.00    Pack years: 4.50    Quit date: 03/15/1981    Years since quitting: 38.4  . Smokeless tobacco: Never Used  Substance Use Topics  . Alcohol use: Yes    Alcohol/week: 1.0 - 2.0 standard drinks    Types: 1 - 2 Glasses of wine per week  . Drug use: No     Allergies   Antihistamines, diphenhydramine-type; Codeine; and Gabapentin   Review of Systems Review of Systems All other systems reviewed and are negative except that which was mentioned in HPI   Physical Exam Updated Vital Signs BP (!) 154/59   Pulse (!) 35   Resp 17   Ht 5' (1.524 m)   Wt 77.1 kg   SpO2 94%   BMI 33.20 kg/m   Physical Exam Vitals signs and nursing note reviewed.  Constitutional:      General: She is not in acute distress.    Appearance: She is well-developed.  HENT:     Head: Normocephalic and atraumatic.  Eyes:     Conjunctiva/sclera: Conjunctivae normal.  Neck:     Musculoskeletal: Neck supple.  Cardiovascular:     Rate and Rhythm:  Regular rhythm. Bradycardia present.     Heart sounds: Normal heart sounds. No murmur.  Pulmonary:     Effort: Pulmonary effort is normal.     Breath sounds: Normal breath sounds.  Abdominal:     General: Bowel sounds are normal. There is no distension.     Palpations: Abdomen is soft.     Tenderness: There is no abdominal tenderness.  Musculoskeletal:     Right lower leg: No edema.     Left lower leg: No edema.  Skin:  General: Skin is warm and dry.  Neurological:     Mental Status: She is alert and oriented to person, place, and time.     Comments: Fluent speech  Psychiatric:        Judgment: Judgment normal.      ED Treatments / Results  Labs (all labs ordered are listed, but only abnormal results are displayed) Labs Reviewed  BASIC METABOLIC PANEL - Abnormal; Notable for the following components:      Result Value   Glucose, Bld 132 (*)    GFR calc non Af Amer 55 (*)    All other components within normal limits  SARS CORONAVIRUS 2 (HOSPITAL ORDER, [place])  CBC WITH DIFFERENTIAL/PLATELET  TSH  TROPONIN I (HIGH SENSITIVITY)  TROPONIN I (HIGH SENSITIVITY)    EKG EKG Interpretation  Date/Time:  Tuesday August 15 2019 16:12:52 EDT Ventricular Rate:  42 PR Interval:    QRS Duration: 131 QT Interval:  674 QTC Calculation: 564 R Axis:   45 Text Interpretation:  Sinus bradycardia Right bundle branch block Borderline ST depression, lateral leads No previous ECGs available Complete (3-degree) AV block ? Confirmed by Theotis Burrow [phone]) on 08/15/2019 4:59:14 PM   Radiology Dg Chest 2 View  Result Date: 08/15/2019 CLINICAL DATA:  Shortness of breath.  Bradycardia. EXAM: CHEST - 2 VIEW COMPARISON:  June 06, 2009 FINDINGS: Platelike opacities in the right mid lung and left base are consistent with scar or atelectasis. Elevation of the right hemidiaphragm persists. The cardiomediastinal silhouette is stable. No pneumothorax. No nodules  or masses. No focal infiltrates. IMPRESSION: No active cardiopulmonary disease. Electronically Signed   By: Dorise Bullion III M.D   On: 08/15/2019 17:16    Procedures Procedures (including critical care time)  Medications Ordered in ED Medications  acetaminophen (TYLENOL) tablet 650 mg (650 mg Oral Given 08/15/19 2236)     Initial Impression / Assessment and Plan / ED Course  I have reviewed the triage vital signs and the nursing notes.  Pertinent labs & imaging results that were available during my care of the patient were reviewed by me and considered in my medical decision making (see chart for details).       Well appearing, comfortable on exam, mildly hypertensive, heart rate 35-40.  EKG shows Mobitz type II heart block.  Screening lab work as well as troponin unremarkable.  Chest x-ray clear.  I suspect her symptoms are due to her significant bradycardia.  I discussed with cardiology, Dr. Domenic Polite, who recommended transfer to [facility] for EP evaluation and consideration of pacemaker.  Patient is COVID negative.  Will be transferred to [facility] for further care.  Final Clinical Impressions(s) / ED Diagnoses   Final diagnoses:  None    ED Discharge Orders    None       Cisco Kindt, Wenda Overland, MD 08/15/19 2317

## 2019-08-15 NOTE — ED Notes (Signed)
ED Provider at bedside. 

## 2019-08-15 NOTE — ED Notes (Signed)
Pt's daughter Fraser Din notified that pt was being transferred to [facility]

## 2019-08-15 NOTE — ED Notes (Signed)
Pt in restroom 

## 2019-08-15 NOTE — Assessment & Plan Note (Signed)
D/w ER Dr Pt to go to ER for further evaluation  Daughter in law is with pt ---- CMA HS brought pt down to ER in wheelchair

## 2019-08-15 NOTE — ED Notes (Signed)
Pt ambulated to restroom and back without difficulty  Tolerated well  Family remains at bedside

## 2019-08-15 NOTE — Progress Notes (Signed)
Patient ID: Wendy Velazquez, female    DOB: 27-Aug-1941  Age: 78 y.o. MRN: [id number]    Subjective:  Subjective  HPI Wendy Velazquez presents for slow heart rate and sob.  For last several days.  She started cutting her dose of propanolol on her own and took 1/4 10 mg tab today  Pt states she can feel palpitations on occasion   Review of Systems  Constitutional: Negative for appetite change, diaphoresis, fatigue and unexpected weight change.  Eyes: Negative for pain, redness and visual disturbance.  Respiratory: Positive for shortness of breath. Negative for cough, chest tightness and wheezing.   Cardiovascular: Positive for palpitations. Negative for chest pain and leg swelling.  Endocrine: Negative for cold intolerance, heat intolerance, polydipsia, polyphagia and polyuria.  Genitourinary: Negative for difficulty urinating, dysuria and frequency.  Neurological: Negative for dizziness, light-headedness, numbness and headaches.    History Past Medical History:  Diagnosis Date  . Arthritis   . Hypertension   . Melanoma ([place])   . MRSA (methicillin resistant staph aureus) culture positive   . Osteopenia   . Rapid heart beat    benigh  . Transitional cell carcinoma ([place])     She has a past surgical history that includes Tonsillectomy; Abdominal hysterectomy (1987); Melanoma excision (1977); Melanoma excision (06-24-09); Cataract extraction (2004); Skin cancer excision (1999); Eye surgery; teeth implants; and Back surgery.   Her family history includes Alzheimer's disease in her mother; Dementia in her mother; Diabetes in her mother; Hypertension in her father; Stroke in her father.She reports that she quit smoking about 38 years ago. She has a 4.50 pack-year smoking history. She has never used smokeless tobacco. She reports current alcohol use of about 1.0 - 2.0 standard drinks of alcohol per week. She reports that she does not use drugs.  Current Outpatient Medications on File Prior to  Visit  Medication Sig Dispense Refill  . acetaminophen (TYLENOL ARTHRITIS PAIN) 650 MG CR tablet Take 650 mg by mouth 3 (three) times daily as needed.     Marland Kitchen amLODipine (NORVASC) 5 MG tablet Take 1 tablet (5 mg total) by mouth daily. 90 tablet 3  . betamethasone dipropionate (DIPROLENE) 0.05 % cream Apply topically 2 (two) times daily. Use thin layer on affected skin areas 45 g 0  . Digestive Enzymes (ENZYMATIC DIGESTANT PO) Take by mouth. Enzymatic Therapy probiotic pearls    . estradiol (ESTRACE) 0.5 MG tablet Take 1 tablet (0.5 mg total) by mouth daily. 90 tablet 1  . fluticasone (FLONASE) 50 MCG/ACT nasal spray Place 2 sprays into both nostrils daily. 16 g 1  . Glucos-MSM-C-Mn-Ginger-Willow (GLUCOSAMINE MSM COMPLEX PO) Take 1 tablet by mouth daily.    . GuaiFENesin (MUCINEX PO) Take 200 mg by mouth at bedtime.    . medroxyPROGESTERone (PROVERA) 5 MG tablet 1 tab po qd 10 days a month as directed.  Brand name necessary. 90 tablet 3  . meloxicam (MOBIC) 15 MG tablet 1 po qd prn 90 tablet 1  . propranolol (INDERAL) 10 MG tablet Take 1 tablet (10 mg total) by mouth 2 (two) times daily. 180 tablet 3  . spironolactone (ALDACTONE) 25 MG tablet 1/2 tab po qd 45 tablet 3  . SYNTHROID 75 MCG tablet Take 1 tablet (75 mcg total) by mouth daily. 90 tablet 3   No current facility-administered medications on file prior to visit.      Objective:  Objective  Physical Exam Vitals signs and nursing note reviewed.  Constitutional:  Appearance: She is well-developed.  HENT:     Head: Normocephalic and atraumatic.  Eyes:     Conjunctiva/sclera: Conjunctivae normal.  Neck:     Musculoskeletal: Normal range of motion and neck supple.     Thyroid: No thyromegaly.     Vascular: No carotid bruit or JVD.  Cardiovascular:     Rate and Rhythm: Regular rhythm. Bradycardia present.     Heart sounds: Normal heart sounds. No murmur.  Pulmonary:     Effort: Pulmonary effort is normal. No respiratory  distress.     Breath sounds: Normal breath sounds. No wheezing or rales.  Chest:     Chest wall: No tenderness.  Neurological:     Mental Status: She is alert and oriented to person, place, and time.    BP (!) 144/80 (BP Location: Right Arm, Patient Position: Sitting, Cuff Size: Normal)   Pulse (!) 41   Temp 97.8 F (36.6 C) (Temporal)   Resp 18   Ht 5\' 3"  (1.6 m)   Wt 171 lb 3.2 oz (77.7 kg)   SpO2 96%   BMI 30.33 kg/m  Wt Readings from Last 3 Encounters:  08/15/19 170 lb (77.1 kg)  08/15/19 171 lb 3.2 oz (77.7 kg)  06/02/19 167 lb 3.2 oz (75.8 kg)     Lab Results  Component Value Date   WBC 6.9 05/04/2017   HGB 14.6 05/04/2017   HCT 43.4 05/04/2017   PLT 204.0 05/04/2017   GLUCOSE 110 (H) 06/02/2019   CHOL 149 06/02/2019   TRIG 171.0 (H) 06/02/2019   HDL 39.40 06/02/2019   LDLDIRECT 102.0 04/18/2015   LDLCALC 76 06/02/2019   ALT 12 06/02/2019   AST 16 06/02/2019   NA 140 06/02/2019   K 4.0 06/02/2019   CL 103 06/02/2019   CREATININE 1.03 06/02/2019   BUN 18 06/02/2019   CO2 30 06/02/2019   TSH 0.83 06/02/2019   MICROALBUR 2.0 (H) 04/18/2015  ekg-- rate 36,  2nd degree av block   Dg Bone Density  Result Date: 06/15/2019 EXAM: DUAL X-RAY ABSORPTIOMETRY (DXA) FOR BONE MINERAL DENSITY IMPRESSION: Wendy Velazquez Your patient Wendy Velazquez completed a BMD test on 06/14/2019 using the [place] (analysis version: 16.SP2) manufactured by [employer]. The following summarizes the results of our evaluation. PATIENT: Name: Wendy Velazquez Patient ID: [id number] Birth Date: 1941/05/01 Height: 62.0 in. Gender: Female Measured: 06/14/2019 Weight: 167.2 lbs. Indications: Advanced Age, Caucasian, Estrogen Deficiency, History of Fracture (Adult), Hypothyroidism, Hysterectomy, Low Calcium Intake, Post Menopausal, Previous Tobacco User Fractures: Elbow Treatments: Estrogen, Synthroid ASSESSMENT: The BMD measured at Femur Neck Right is 0.759 g/cm2 with a T-score  of -2.0. This patient is considered OSTEOPENIC according to [facility]) criteria. Scan quality was good. Lumbar spine was not utilized due to advanced degenerative changes. Site Region Measured Date Measured Age WHO YA BMD Classification T-score DualFemur Neck Right 06/14/2019 [age] Osteopenia -2.0 0.759 g/cm2 Left Forearm Radius 33% 06/14/2019 77.0 Normal 0.1 0.885 g/cm2 World Health Organization [facility]) criteria for post-menopausal, Caucasian Women: Normal       T-score at or above -1 SD Osteopenia   T-score between -1 and -2.5 SD Osteoporosis T-score at or below -2.5 SD RECOMMENDATION: 1. All patients should optimize calcium and vitamin D intake. 2. Consider FDA-approved medical therapies in postmenopausal women and men aged 66 years and older, based on the following: a. A hip or vertebral(clinical or morphometric) fracture. b. T-Score < -2.5 at the femoral  neck or spine after appropriate evaluation to exclude secondary causes c. Low bone mass (T-score between -1.0 and -2.5 at the femoral neck or spine) and a 10 year probability of a hip fracture >3% or a 10 year probability of major osteoporosis-related fracture > 20% based on the [place]-adapted WHO algorithm d. Clinical judgement and/or patient preferences may indicate treatment for people with 10-year fracture probabilities above or below these levels FOLLOW-UP: Patients with diagnosis of osteoporosis or at high risk for fracture should have regular bone mineral density tests. For patients eligible for Medicare, routine testing is allowed once every 2 years. The testing frequency can be increased to one year for patients who have rapidly progressing disease, those who are receiving or discontinuing medical therapy to restore bone mass, or have additional risk factors. I have reviewed this report and agree with the above findings. Mark A. Thornton Papas, M.D. [facility] Radiology Electronically Signed   By: Lavonia Dana M.D.   On: 06/15/2019 12:17      Assessment & Plan:  Plan  I am having Amesha A. Proudfoot maintain her acetaminophen, Digestive Enzymes (ENZYMATIC DIGESTANT PO), Glucos-MSM-C-Mn-Ginger-Willow (GLUCOSAMINE MSM COMPLEX PO), fluticasone, GuaiFENesin (MUCINEX PO), betamethasone dipropionate, amLODipine, medroxyPROGESTERone, propranolol, spironolactone, Synthroid, estradiol, and meloxicam.  No orders of the defined types were placed in this encounter.   Problem List Items Addressed This Visit      Unprioritized   Bradycardia - Primary    D/w ER Dr Pt to go to ER for further evaluation  Daughter in law is with pt ---- CMA HS brought pt down to ER in wheelchair       Relevant Orders   EKG 12-Lead (Completed)      Follow-up: No follow-ups on file.  Ann Held, DO

## 2019-08-15 NOTE — ED Notes (Signed)
Report given to [facility]. 

## 2019-08-15 NOTE — ED Notes (Addendum)
EKG/cardiac pacer Pads on patient

## 2019-08-15 NOTE — ED Notes (Signed)
Delay starting triage-pt in restroom

## 2019-08-15 NOTE — ED Notes (Signed)
Pt denies chest pain, sob

## 2019-08-15 NOTE — ED Triage Notes (Signed)
Pt sent from upstairs r/t bradycardia and sob with exertion. Denies chest pain. Pt also reports mild dizziness. Pt ambulated to restroom.

## 2019-08-15 NOTE — ED Notes (Signed)
ED TO INPATIENT HANDOFF REPORT  ED Nurse Name and Phone #: Lattie Haw [phone]  S Name/Age/Gender Wendy Velazquez 78 y.o. female Room/Bed: MH11/MH11  Code Status   Code Status: Not on file  Home/SNF/Other Home Patient oriented to: self, place, time and situation Is this baseline? Yes   Triage Complete: Triage complete  Chief Complaint Abnormal EKG, Low Heart Rate  Triage Note Pt sent from upstairs r/t bradycardia and sob with exertion. Denies chest pain. Pt also reports mild dizziness. Pt ambulated to restroom.    Allergies Allergies  Allergen Reactions  . Antihistamines, Diphenhydramine-Type Tinitus  . Codeine     Nausea  Can take Hydromet  . Gabapentin Rash    Level of Care/Admitting Diagnosis ED Disposition    ED Disposition Condition [facility] Area: [place] [[id number]]  Level of Care: Progressive [102]  Covid Evaluation: Asymptomatic Screening Protocol (No Symptoms)  Diagnosis: Mobitz type II atrioventricular block [id number]  Admitting Physician: MCDOWELL, Alfarata  Attending Physician: Satira Sark [[id number]]  Estimated length of stay: past midnight tomorrow  Certification:: I certify this patient will need inpatient services for at least 2 midnights  Bed request comments: cardiology step down  PT Class (Do Not Modify): Inpatient [101]  PT Acc Code (Do Not Modify): Private [1]       B Medical/Surgery History Past Medical History:  Diagnosis Date  . Arthritis   . Hypertension   . Melanoma ([place])   . MRSA (methicillin resistant staph aureus) culture positive   . Osteopenia   . Rapid heart beat    benigh  . Transitional cell carcinoma [facility])    Past Surgical History:  Procedure Laterality Date  . ABDOMINAL HYSTERECTOMY  1987   partial  . BACK SURGERY     08/02/2018  . CATARACT EXTRACTION  2004  . EYE SURGERY     cataract b/l  . MELANOMA EXCISION  1977   left leg  . MELANOMA EXCISION  06-24-09   r foot   . SKIN CANCER EXCISION  1999   bladder transitional cell  . teeth implants    . TONSILLECTOMY       A IV Location/Drains/Wounds Patient Lines/Drains/Airways Status   Active Line/Drains/Airways    None          Intake/Output Last 24 hours No intake or output data in the 24 hours ending 08/15/19 2206  Labs/Imaging Results for orders placed or performed during the hospital encounter of 08/15/19 (from the past 48 hour(s))  Basic metabolic panel     Status: Abnormal   Collection Time: 08/15/19  4:41 PM  Result Value Ref Range   Sodium 139 135 - 145 mmol/L   Potassium 3.6 3.5 - 5.1 mmol/L   Chloride 103 98 - 111 mmol/L   CO2 23 22 - 32 mmol/L   Glucose, Bld 132 (H) 70 - 99 mg/dL   BUN 14 8 - 23 mg/dL   Creatinine, Ser 0.99 0.44 - 1.00 mg/dL   Calcium 9.4 8.9 - 10.3 mg/dL   GFR calc non Af Amer 55 (L) >60 mL/min   GFR calc Af Amer >60 >60 mL/min   Anion gap 13 5 - 15    Comment: Performed at [facility], [address] [place], [place] [address]  CBC with Differential     Status: None   Collection Time: 08/15/19  4:41 PM  Result Value Ref Range   WBC 6.6 4.0 -  10.5 K/uL   RBC 4.65 3.87 - 5.11 MIL/uL   Hemoglobin 14.1 12.0 - 15.0 g/dL   HCT 44.1 36.0 - 46.0 %   MCV 94.8 80.0 - 100.0 fL   MCH 30.3 26.0 - 34.0 pg   MCHC 32.0 30.0 - 36.0 g/dL   RDW 13.0 11.5 - 15.5 %   Platelets 199 150 - 400 K/uL   nRBC 0.0 0.0 - 0.2 %   Neutrophils Relative % 60 %   Neutro Abs 4.1 1.7 - 7.7 K/uL   Lymphocytes Relative 28 %   Lymphs Abs 1.8 0.7 - 4.0 K/uL   Monocytes Relative 9 %   Monocytes Absolute 0.6 0.1 - 1.0 K/uL   Eosinophils Relative 2 %   Eosinophils Absolute 0.1 0.0 - 0.5 K/uL   Basophils Relative 1 %   Basophils Absolute 0.0 0.0 - 0.1 K/uL   Immature Granulocytes 0 %   Abs Immature Granulocytes 0.01 0.00 - 0.07 K/uL    Comment: Performed at [facility], [address] [place], [place] [address]  Troponin I (High Sensitivity)     Status: None    Collection Time: 08/15/19  4:44 PM  Result Value Ref Range   Troponin I (High Sensitivity) 13 <18 ng/L    Comment: (NOTE) Elevated high sensitivity troponin I (hsTnI) values and significant  changes across serial measurements may suggest ACS but many other  chronic and acute conditions are known to elevate hsTnI results.  Refer to the "Links" section for chest pain algorithms and additional  guidance. Performed at [facility], [address] [place], [place] [address]   Troponin I (High Sensitivity)     Status: None   Collection Time: 08/15/19  7:05 PM  Result Value Ref Range   Troponin I (High Sensitivity) 15 <18 ng/L    Comment: (NOTE) Elevated high sensitivity troponin I (hsTnI) values and significant  changes across serial measurements may suggest ACS but many other  chronic and acute conditions are known to elevate hsTnI results.  Refer to the "Links" section for chest pain algorithms and additional  guidance. Performed at [facility], [address] [place], [place] [address]   SARS Coronavirus 2 [facility] order, Performed in [facility] hospital lab) Nasopharyngeal Nasopharyngeal Swab     Status: None   Collection Time: 08/15/19  7:06 PM   Specimen: Nasopharyngeal Swab  Result Value Ref Range   SARS Coronavirus 2 NEGATIVE NEGATIVE    Comment: (NOTE) If result is NEGATIVE SARS-CoV-2 target nucleic acids are NOT DETECTED. The SARS-CoV-2 RNA is generally detectable in upper and lower  respiratory specimens during the acute phase of infection. The lowest  concentration of SARS-CoV-2 viral copies this assay can detect is 250  copies / mL. A negative result does not preclude SARS-CoV-2 infection  and should not be used as the sole basis for treatment or other  patient management decisions.  A negative result may occur with  improper specimen collection / handling, submission of specimen other  than nasopharyngeal swab, presence of viral  mutation(s) within the  areas targeted by this assay, and inadequate number of viral copies  (<250 copies / mL). A negative result must be combined with clinical  observations, patient history, and epidemiological information. If result is POSITIVE SARS-CoV-2 target nucleic acids are DETECTED. The SARS-CoV-2 RNA is generally detectable in upper and lower  respiratory specimens dur ing the acute phase of infection.  Positive  results  are indicative of active infection with SARS-CoV-2.  Clinical  correlation with patient history and other diagnostic information is  necessary to determine patient infection status.  Positive results do  not rule out bacterial infection or co-infection with other viruses. If result is PRESUMPTIVE POSTIVE SARS-CoV-2 nucleic acids MAY BE PRESENT.   A presumptive positive result was obtained on the submitted specimen  and confirmed on repeat testing.  While 2019 novel coronavirus  (SARS-CoV-2) nucleic acids may be present in the submitted sample  additional confirmatory testing may be necessary for epidemiological  and / or clinical management purposes  to differentiate between  SARS-CoV-2 and other Sarbecovirus currently known to infect humans.  If clinically indicated additional testing with an alternate test  methodology [phone]) is advised. The SARS-CoV-2 RNA is generally  detectable in upper and lower respiratory sp ecimens during the acute  phase of infection. The expected result is Negative. Fact Sheet for Patients:  StrictlyIdeas.no Fact Sheet for Healthcare Providers: BankingDealers.co.za This test is not yet approved or cleared by the [place] FDA and has been authorized for detection and/or diagnosis of SARS-CoV-2 by FDA under an Emergency Use Authorization (EUA).  This EUA will remain in effect (meaning this test can be used) for the duration of the COVID-19 declaration under Section 564(b)(1)  of the Act, 21 U.S.C. section 360bbb-3(b)(1), unless the authorization is terminated or revoked sooner. Performed at [facility], [address], [place], [place] [address]    Dg Chest 2 View  Result Date: 08/15/2019 CLINICAL DATA:  Shortness of breath.  Bradycardia. EXAM: CHEST - 2 VIEW COMPARISON:  June 06, 2009 FINDINGS: Platelike opacities in the right mid lung and left base are consistent with scar or atelectasis. Elevation of the right hemidiaphragm persists. The cardiomediastinal silhouette is stable. No pneumothorax. No nodules or masses. No focal infiltrates. IMPRESSION: No active cardiopulmonary disease. Electronically Signed   By: Dorise Bullion III M.D   On: 08/15/2019 17:16    Pending Labs Unresulted Labs (From admission, onward)    Start     Ordered   08/15/19 1631  TSH  ONCE - STAT,   STAT     08/15/19 1630          Vitals/Pain Today's Vitals   08/15/19 1930 08/15/19 2000 08/15/19 2030 08/15/19 2130  BP: (!) 167/68 (!) 171/74 (!) 177/70 (!) 169/67  Pulse: (!) 39 (!) 38 (!) 37 (!) 36  Resp: 15 17 12 19   SpO2: 96% 96% 95% 95%  Weight:      Height:      PainSc:        Isolation Precautions No active isolations  Medications Medications - No data to display  Mobility walks Low fall risk   Focused Assessments Cardiac Assessment Handoff:  Cardiac Rhythm: Sinus bradycardia No results found for: CKTOTAL, CKMB, CKMBINDEX, TROPONINI No results found for: DDIMER Does the Patient currently have chest pain? No      R Recommendations: See Admitting Provider Note  Report given to:   Additional Notes:

## 2019-08-16 ENCOUNTER — Other Ambulatory Visit (HOSPITAL_COMMUNITY): Payer: Medicare Other

## 2019-08-16 ENCOUNTER — Inpatient Hospital Stay (HOSPITAL_COMMUNITY)
Admission: EM | Disposition: A | Discharge status: Home / Self Care | Payer: Self-pay | Source: Home / Self Care | Attending: Cardiology

## 2019-08-16 ENCOUNTER — Inpatient Hospital Stay (HOSPITAL_COMMUNITY): Payer: Medicare Other

## 2019-08-16 DIAGNOSIS — R001 Bradycardia, unspecified: Secondary | ICD-10-CM

## 2019-08-16 DIAGNOSIS — I442 Atrioventricular block, complete: Secondary | ICD-10-CM

## 2019-08-16 DIAGNOSIS — I441 Atrioventricular block, second degree: Principal | ICD-10-CM

## 2019-08-16 HISTORY — PX: PACEMAKER IMPLANT: EP1218

## 2019-08-16 LAB — BASIC METABOLIC PANEL
Anion gap: 15 (ref 5–15)
BUN: 11 mg/dL (ref 8–23)
CO2: 20 mmol/L — ABNORMAL LOW (ref 22–32)
Calcium: 8.9 mg/dL (ref 8.9–10.3)
Chloride: 104 mmol/L (ref 98–111)
Creatinine, Ser: 0.99 mg/dL (ref 0.44–1.00)
GFR calc Af Amer: >60 mL/min (ref >60)
GFR calc non Af Amer: 55 mL/min — ABNORMAL LOW (ref >60)
Glucose, Bld: 106 mg/dL — ABNORMAL HIGH (ref 70–99)
Potassium: 3.7 mmol/L (ref 3.5–5.1)
Sodium: 139 mmol/L (ref 135–145)

## 2019-08-16 LAB — CBC
HCT: 42.6 % (ref 36.0–46.0)
Hemoglobin: 14.1 g/dL (ref 12.0–15.0)
MCH: 31.1 pg (ref 26.0–34.0)
MCHC: 33.1 g/dL (ref 30.0–36.0)
MCV: 93.8 fL (ref 80.0–100.0)
Platelets: 193 10*3/uL (ref 150–400)
RBC: 4.54 MIL/uL (ref 3.87–5.11)
RDW: 13 % (ref 11.5–15.5)
WBC: 8.3 10*3/uL (ref 4.0–10.5)
nRBC: 0 % (ref 0.0–0.2)

## 2019-08-16 LAB — SURGICAL PCR SCREEN
MRSA, PCR: NEGATIVE
Staphylococcus aureus: NEGATIVE

## 2019-08-16 SURGERY — PACEMAKER IMPLANT

## 2019-08-16 MED ORDER — ACETAMINOPHEN 325 MG PO TABS
650.0000 mg | ORAL_TABLET | ORAL | Status: DC | PRN
  Administered 2019-08-16 – 2019-08-17 (×3): 650 mg via ORAL
  Filled 2019-08-16 (×3): qty 2

## 2019-08-16 MED ORDER — SODIUM CHLORIDE 0.9 % IV SOLN: 250.0000 mL | INTRAVENOUS | Status: DC

## 2019-08-16 MED ORDER — LEVOTHYROXINE SODIUM 75 MCG PO TABS
75.0000 ug | ORAL_TABLET | Freq: Every day | ORAL | Status: DC
  Administered 2019-08-16 – 2019-08-17 (×2): 75 ug via ORAL
  Filled 2019-08-16 (×2): qty 1

## 2019-08-16 MED ORDER — ACETAMINOPHEN 325 MG PO TABS: 325.0000 mg | ORAL_TABLET | ORAL | Status: DC | PRN

## 2019-08-16 MED ORDER — LIDOCAINE HCL (PF) 1 % IJ SOLN
INTRAMUSCULAR | Status: DC | PRN
  Administered 2019-08-16: 60 mL

## 2019-08-16 MED ORDER — ASPIRIN 81 MG PO CHEW
324.0000 mg | CHEWABLE_TABLET | ORAL | Status: AC
  Administered 2019-08-16: 324 mg via ORAL
  Filled 2019-08-16: qty 4

## 2019-08-16 MED ORDER — NITROGLYCERIN 0.4 MG SL SUBL: 0.4000 mg | SUBLINGUAL_TABLET | SUBLINGUAL | Status: DC | PRN

## 2019-08-16 MED ORDER — ONDANSETRON HCL 4 MG/2ML IJ SOLN: 4.0000 mg | Freq: Four times a day (QID) | INTRAMUSCULAR | Status: DC | PRN

## 2019-08-16 MED ORDER — PROPRANOLOL HCL 10 MG PO TABS
10.0000 mg | ORAL_TABLET | Freq: Two times a day (BID) | ORAL | Status: DC
  Administered 2019-08-16 – 2019-08-17 (×2): 10 mg via ORAL
  Filled 2019-08-16 (×3): qty 1

## 2019-08-16 MED ORDER — MIDAZOLAM HCL 5 MG/5ML IJ SOLN
INTRAMUSCULAR | Status: AC
  Filled 2019-08-16: qty 5

## 2019-08-16 MED ORDER — HEPARIN (PORCINE) IN NACL 1000-0.9 UT/500ML-% IV SOLN
INTRAVENOUS | Status: AC
  Filled 2019-08-16: qty 500

## 2019-08-16 MED ORDER — SPIRONOLACTONE 12.5 MG HALF TABLET
12.5000 mg | ORAL_TABLET | Freq: Every day | ORAL | Status: DC
  Administered 2019-08-16 – 2019-08-17 (×2): 12.5 mg via ORAL
  Filled 2019-08-16 (×2): qty 1

## 2019-08-16 MED ORDER — ASPIRIN 300 MG RE SUPP: 300.0000 mg | RECTAL | Status: AC

## 2019-08-16 MED ORDER — CHLORHEXIDINE GLUCONATE 4 % EX LIQD
60.0000 mL | Freq: Once | CUTANEOUS | Status: DC
  Filled 2019-08-16: qty 15

## 2019-08-16 MED ORDER — CHLORHEXIDINE GLUCONATE 4 % EX LIQD
60.0000 mL | Freq: Once | CUTANEOUS | Status: AC
  Administered 2019-08-16: 4 via TOPICAL

## 2019-08-16 MED ORDER — MIDAZOLAM HCL 5 MG/5ML IJ SOLN
INTRAMUSCULAR | Status: DC | PRN
  Administered 2019-08-16: 1 mg via INTRAVENOUS
  Administered 2019-08-16: 2 mg via INTRAVENOUS
  Administered 2019-08-16: 1 mg via INTRAVENOUS

## 2019-08-16 MED ORDER — CEFAZOLIN SODIUM-DEXTROSE 1-4 GM/50ML-% IV SOLN
1.0000 g | Freq: Four times a day (QID) | INTRAVENOUS | Status: AC
  Administered 2019-08-16 – 2019-08-17 (×3): 1 g via INTRAVENOUS
  Filled 2019-08-16 (×3): qty 50

## 2019-08-16 MED ORDER — SODIUM CHLORIDE 0.9 % IV SOLN
INTRAVENOUS | Status: DC
  Administered 2019-08-16: 10:00:00 via INTRAVENOUS

## 2019-08-16 MED ORDER — SODIUM CHLORIDE 0.9% FLUSH
3.0000 mL | Freq: Two times a day (BID) | INTRAVENOUS | Status: DC
  Administered 2019-08-16 – 2019-08-17 (×2): 3 mL via INTRAVENOUS

## 2019-08-16 MED ORDER — LIDOCAINE HCL 1 % IJ SOLN
INTRAMUSCULAR | Status: AC
  Filled 2019-08-16: qty 60

## 2019-08-16 MED ORDER — HEPARIN (PORCINE) IN NACL 1000-0.9 UT/500ML-% IV SOLN
INTRAVENOUS | Status: DC | PRN
  Administered 2019-08-16: 500 mL

## 2019-08-16 MED ORDER — FENTANYL CITRATE (PF) 100 MCG/2ML IJ SOLN
INTRAMUSCULAR | Status: AC
  Filled 2019-08-16: qty 2

## 2019-08-16 MED ORDER — CEFAZOLIN SODIUM-DEXTROSE 2-4 GM/100ML-% IV SOLN
2.0000 g | INTRAVENOUS | Status: AC
  Administered 2019-08-16: 2 g via INTRAVENOUS
  Filled 2019-08-16 (×2): qty 100

## 2019-08-16 MED ORDER — ACETAMINOPHEN ER 650 MG PO TBCR: 650.0000 mg | EXTENDED_RELEASE_TABLET | Freq: Three times a day (TID) | ORAL | Status: DC | PRN

## 2019-08-16 MED ORDER — SODIUM CHLORIDE 0.9 % IV SOLN
80.0000 mg | INTRAVENOUS | Status: AC
  Administered 2019-08-16: 80 mg
  Filled 2019-08-16 (×3): qty 2

## 2019-08-16 MED ORDER — CEFAZOLIN SODIUM-DEXTROSE 2-4 GM/100ML-% IV SOLN
INTRAVENOUS | Status: AC
  Filled 2019-08-16: qty 100

## 2019-08-16 MED ORDER — HEPARIN SODIUM (PORCINE) 5000 UNIT/ML IJ SOLN
5000.0000 [IU] | Freq: Three times a day (TID) | INTRAMUSCULAR | Status: DC
  Administered 2019-08-16: 5000 [IU] via SUBCUTANEOUS
  Filled 2019-08-16: qty 1

## 2019-08-16 MED ORDER — SODIUM CHLORIDE 0.9% FLUSH: 3.0000 mL | INTRAVENOUS | Status: DC | PRN

## 2019-08-16 MED ORDER — FENTANYL CITRATE (PF) 100 MCG/2ML IJ SOLN
INTRAMUSCULAR | Status: DC | PRN
  Administered 2019-08-16: 25 ug via INTRAVENOUS
  Administered 2019-08-16 (×2): 12.5 ug via INTRAVENOUS

## 2019-08-16 MED ORDER — TRAMADOL HCL 50 MG PO TABS
50.0000 mg | ORAL_TABLET | Freq: Four times a day (QID) | ORAL | Status: DC | PRN
  Administered 2019-08-16 – 2019-08-17 (×2): 50 mg via ORAL
  Filled 2019-08-16 (×2): qty 1

## 2019-08-16 MED ORDER — SODIUM CHLORIDE 0.9 % IV SOLN
INTRAVENOUS | Status: AC
  Filled 2019-08-16: qty 2

## 2019-08-16 SURGICAL SUPPLY — 12 items
CABLE SURGICAL S-101-97-12 (CABLE) ×2 IMPLANT
CATH RIGHTSITE C315HIS02 (CATHETERS) ×1 IMPLANT
LEAD CAPSURE NOVUS 45CM (Lead) ×1 IMPLANT
LEAD SELECT SECURE 3830 383069 (Lead) IMPLANT
PACEMAKER ASSURITY DR-RF (Pacemaker) ×1 IMPLANT
PAD PRO RADIOLUCENT 2001M-C (PAD) ×2 IMPLANT
SELECT SECURE 3830 383069 (Lead) ×2 IMPLANT
SHEATH 7FR PRELUDE SNAP 13 (SHEATH) ×2 IMPLANT
SHEATH 8FR PRELUDE SNAP 13 (SHEATH) ×1 IMPLANT
SLITTER 6232ADJ (MISCELLANEOUS) ×1 IMPLANT
TRAY PACEMAKER INSERTION (PACKS) ×2 IMPLANT
WIRE HI TORQ VERSACORE-J 145CM (WIRE) ×1 IMPLANT

## 2019-08-16 NOTE — H&P (Signed)
PCP:  Ann Held, DO  PCP-Cardiology: No primary care provider on file.     Reason for Admission: Bradycardia  HPI:  78 y.o. with history of hypothyroidism, HTN, and melanoma was admitted due to symptomatic bradycardia.  Patient's only prior cardiac history was evaluation for "tachycardia" 30+ years ago.  She was started on propranolol at that time and has been on propranolol 10 mg bid ever since then. She noted bradycardia with HR in 30s-40s on her home BP monitor starting last week. She has noted dyspnea walking longer distances as well as fatigue but no chest pain, lightheadedness, or syncope.  She stopped propranolol over the weekend but did take 1/4 of a pill today.  She went to her PCP's office today and was noted to be bradycardic.  She was sent to the ER.  In the ER, she was noted to be in 2:1 AV block with rate 30s-40s.  She was hypertensive.  CXR was clear.  No history of tick bite.  She was admitted for further workup. Labs unremarkable with normal hs-TnI.   Review of Systems: All systems reviewed and negative except as per HPI.   Home Medications Prior to Admission medications   Medication Sig Start Date End Date Taking? Authorizing Provider  acetaminophen (TYLENOL ARTHRITIS PAIN) 650 MG CR tablet Take 650 mg by mouth 3 (three) times daily as needed.     [provider]  amLODipine (NORVASC) 5 MG tablet Take 1 tablet (5 mg total) by mouth daily. 06/02/19   Ann Held, DO  betamethasone dipropionate (DIPROLENE) 0.05 % cream Apply topically 2 (two) times daily. Use thin layer on affected skin areas 12/10/18   Jodelle Green, FNP  Digestive Enzymes (ENZYMATIC DIGESTANT PO) Take by mouth. Enzymatic Therapy probiotic pearls    [provider]  estradiol (ESTRACE) 0.5 MG tablet Take 1 tablet (0.5 mg total) by mouth daily. 06/02/19   Roma Schanz R, DO  fluticasone (FLONASE) 50 MCG/ACT nasal spray Place 2 sprays into both nostrils daily.  12/24/15   Saguier, Percell Miller, PA-C  Glucos-MSM-C-Mn-Ginger-Willow (GLUCOSAMINE MSM COMPLEX PO) Take 1 tablet by mouth daily.    [provider]  GuaiFENesin (MUCINEX PO) Take 200 mg by mouth at bedtime.    [provider]  medroxyPROGESTERone (PROVERA) 5 MG tablet 1 tab po qd 10 days a month as directed.  Brand name necessary. 06/02/19   Ann Held, DO  meloxicam (MOBIC) 15 MG tablet 1 po qd prn 06/02/19   Carollee Herter, Alferd Apa, DO  propranolol (INDERAL) 10 MG tablet Take 1 tablet (10 mg total) by mouth 2 (two) times daily. 06/02/19   Roma Schanz R, DO  spironolactone (ALDACTONE) 25 MG tablet 1/2 tab po qd 06/02/19   Carollee Herter, Yvonne R, DO  SYNTHROID 75 MCG tablet Take 1 tablet (75 mcg total) by mouth daily. 06/02/19   Ann Held, DO    Past Medical History: Past Medical History:  Diagnosis Date  . Arthritis   . Hypertension   . Melanoma ([place])   . MRSA (methicillin resistant staph aureus) culture positive   . Osteopenia   . Rapid heart beat    benigh  . Transitional cell carcinoma [facility])     Past Surgical History: Past Surgical History:  Procedure Laterality Date  . ABDOMINAL HYSTERECTOMY  1987   partial  . BACK SURGERY     08/02/2018  . CATARACT EXTRACTION  2004  .  EYE SURGERY     cataract b/l  . MELANOMA EXCISION  1977   left leg  . MELANOMA EXCISION  06-24-09   r foot  . SKIN CANCER EXCISION  1999   bladder transitional cell  . teeth implants    . TONSILLECTOMY      Family History:  Family History  Problem Relation Age of Onset  . Diabetes Mother   . Alzheimer's disease Mother   . Dementia Mother   . Stroke Father   . Hypertension Father     Social History: Social History   Socioeconomic History  . Marital status: Widowed    Spouse name: Not on file  . Number of children: Not on file  . Years of education: Not on file  . Highest education level: Not on file  Occupational History  . Occupation: Oceanographer: [employer]    Comment: 4 days a week  Social Needs  . Financial resource strain: Not on file  . Food insecurity    Worry: Not on file    Inability: Not on file  . Transportation needs    Medical: Not on file    Non-medical: Not on file  Tobacco Use  . Smoking status: Former Smoker    Packs/day: 0.30    Years: 15.00    Pack years: 4.50    Quit date: 03/15/1981    Years since quitting: 38.4  . Smokeless tobacco: Never Used  Substance and Sexual Activity  . Alcohol use: Yes    Alcohol/week: 1.0 - 2.0 standard drinks    Types: 1 - 2 Glasses of wine per week  . Drug use: No  . Sexual activity: Not Currently    Partners: Male  Lifestyle  . Physical activity    Days per week: Not on file    Minutes per session: Not on file  . Stress: Not on file  Relationships  . Social Herbalist on phone: Not on file    Gets together: Not on file    Attends religious service: Not on file    Active member of club or organization: Not on file    Attends meetings of clubs or organizations: Not on file    Relationship status: Not on file  Other Topics Concern  . Not on file  Social History Narrative   Exercise--- dancing    Allergies:  Allergies  Allergen Reactions  . Antihistamines, Diphenhydramine-Type Tinitus  . Codeine     Nausea  Can take Hydromet  . Gabapentin Rash    Objective:    Vital Signs:   Temp:  [98.1 F (36.7 C)] 98.1 F (36.7 C) (09/30 0054) Pulse Rate:  [35-40] 35 (09/30 0054) Resp:  [10-23] 20 (09/30 0054) BP: (154-180)/(59-74) 178/68 (09/30 0054) SpO2:  [94 %-98 %] 95 % (09/30 0054) Weight:  [76.2 kg-77.1 kg] 76.2 kg (09/30 0055) Last BM Date: 08/16/19 Filed Weights   08/15/19 1616 08/16/19 0055  Weight: 77.1 kg 76.2 kg     Physical Exam     General:  Well appearing. No respiratory difficulty HEENT: Normal Neck: Supple. no JVD. Carotids 2+ bilat; no bruits. No lymphadenopathy or thyromegaly appreciated. Cor: PMI  nondisplaced. Regular rate & rhythm. No rubs, gallops or murmurs. Lungs: Clear Abdomen: Soft, nontender, nondistended. No hepatosplenomegaly. No bruits or masses. Good bowel sounds. Extremities: No cyanosis, clubbing, rash, edema Neuro: Alert & oriented x 3, cranial nerves grossly intact. moves all 4 extremities  w/o difficulty. Affect pleasant.   Telemetry   2:1 AVB rate 30s (personally reviewed)  EKG   NSR with 2:1 AVB, HR 42, RBBB.  RBBB present on prior ECG.  Personally reviewed.   Labs     Basic Metabolic Panel: Recent Labs  Lab 08/15/19 1641  NA 139  K 3.6  CL 103  CO2 23  GLUCOSE 132*  BUN 14  CREATININE 0.99  CALCIUM 9.4    Liver Function Tests: No results for input(s): AST, ALT, ALKPHOS, BILITOT, PROT, ALBUMIN in the last 168 hours. No results for input(s): LIPASE, AMYLASE in the last 168 hours. No results for input(s): AMMONIA in the last 168 hours.  CBC: Recent Labs  Lab 08/15/19 1641  WBC 6.6  NEUTROABS 4.1  HGB 14.1  HCT 44.1  MCV 94.8  PLT 199    Cardiac Enzymes: No results for input(s): CKTOTAL, CKMB, CKMBINDEX, TROPONINI in the last 168 hours.  BNP: BNP (last 3 results) No results for input(s): BNP in the last 8760 hours.  ProBNP (last 3 results) No results for input(s): PROBNP in the last 8760 hours.   CBG: No results for input(s): GLUCAP in the last 168 hours.  Coagulation Studies: No results for input(s): LABPROT, INR in the last 72 hours.  Imaging: Dg Chest 2 View  Result Date: 08/15/2019 CLINICAL DATA:  Shortness of breath.  Bradycardia. EXAM: CHEST - 2 VIEW COMPARISON:  June 06, 2009 FINDINGS: Platelike opacities in the right mid lung and left base are consistent with scar or atelectasis. Elevation of the right hemidiaphragm persists. The cardiomediastinal silhouette is stable. No pneumothorax. No nodules or masses. No focal infiltrates. IMPRESSION: No active cardiopulmonary disease. Electronically Signed   By: Dorise Bullion  III M.D   On: 08/15/2019 17:16      Assessment/Plan   1. Symptomatic bradycardia: High grade heart block for about a week based on her home BP monitor (gives pulse).  2:1 AVB noted on ECG and on telemetry currently. HR 30s-40s but not lightheaded and BP high.  She notes exertional dyspnea.  No chest pain. She stopped propranolol over the weekend but took 1/4 tab today. I suspect that she will need a PPM.  - Stay off nodal blockers.  - Evaluation by EP in the morning, will likely need PPM.  Keep NPO.  - Echo 2. HTN: Continue spironolactone for now but will hold amlodipine.  Restart after PPM.    Loralie Champagne, MD 08/16/2019, 1:32 AM  Advanced Heart Failure Team Pager [phone] (M-F; 7a - 4p)  Please contact [facility] for night-coverage after hours (4p -7a ) and weekends on [email or web address]

## 2019-08-16 NOTE — Discharge Summary (Addendum)
DISCHARGE SUMMARY    Patient ID: Wendy Velazquez,  MRN: [id number], DOB/AGE: 1941/09/13 78 y.o.  Admit date: 08/15/2019 Discharge date: 08/17/2019  Primary Care Physician: Ann Held, DO  Primary Cardiologist/Electrophysiologist: new to [facility], Dr. Lovena Le  Primary Discharge Diagnosis:  1. Advanced heart block, symptomatic bradycardia  Secondary Discharge Diagnosis:  1. HTN 2. hypothyroidism  Allergies  Allergen Reactions  . Antihistamines, Diphenhydramine-Type Tinitus  . Codeine Nausea Only    Can tolerate hydrocodone  . Gabapentin Rash     Procedures This Admission:  1.  Implantation of a SJM dual chamber PPM on 08/16/2019 by Dr Lovena Le.  The patient received a St. Jude (serial number [id number] pacemaker, Medtronic (serial number [id number]) right atrial lead and a Medtronic (serial number [id number]) right ventricular lead (apical/septal position) There were no immediate post procedure complications. 2.  CXR on 08/17/2019 demonstrated no pneumothorax status post device implantation.   Brief HPI: Wendy Velazquez is a 78 y.o. female with a hx of hypothyroidism, melanoma, HTN, very remote and vage h/o a tachycardia 30+ years ago started on BB back then.  In the last couple weeks noted with longer walks feeling unusually SOB and easily fatigued.  This out of he ordinary for her, she held her propanolol over the weekend without feeling any better took a small dose (1/4tab) the day of her admission.  No CP, palpitations, no near syncope or syncope.  She sought attention by her PMD who found her bradycardiac and referred her to the ER.   Hospital Course:  The patient was admitted unrevealing labs, no reversible cause noted for her bradycardia and underwent implantation of a PPM with details as outlined above.  She was monitored on telemetry overnight which demonstrated AV pacing.   Left chest was without hematoma or ecchymosis.  The device was interrogated and found to be  functioning normally.  CXR was obtained and demonstrated no pneumothorax status post device implantation.  Wound care, arm mobility, and restrictions were reviewed with the patient.  The patient feels well, no CP or SOB, she was examined by Dr. Lovena Le and considered stable for discharge to home.    Physical Exam: Vitals:   08/16/19 2141 08/16/19 2144 08/17/19 0512 08/17/19 0824  BP: (!) 142/94 (!) 142/94 129/68 120/68  Pulse: 65 67 65 60  Resp:   20 16  Temp:  97.8 F (36.6 C) 97.7 F (36.5 C) 98.5 F (36.9 C)  TempSrc:  Oral Oral Oral  SpO2:  94% 94% 93%  Weight:   76.5 kg   Height:         GEN- The patient is well appearing, alert and oriented x 3 today.   HEENT: normocephalic, atraumatic; sclera clear, conjunctiva pink; hearing intact; oropharynx clear; neck supple, no JVP Lungs- CTA b/l, normal work of breathing.  No wheezes, rales, rhonchi Heart- RRR, no murmurs, rubs or gallops, PMI not laterally displaced GI- soft, non-tender, non-distended, Extremities- no clubbing, cyanosis, or edema MS- no significant deformity or atrophy Skin- warm and dry, no rash or lesion, left chest without hematoma/ecchymosis Psych- euthymic mood, full affect Neuro- no gross deficits   Labs:   Lab Results  Component Value Date   WBC 8.3 08/16/2019   HGB 14.1 08/16/2019   HCT 42.6 08/16/2019   MCV 93.8 08/16/2019   PLT 193 08/16/2019    Recent Labs  Lab 08/16/19 0304  NA 139  K 3.7  CL 104  CO2 20*  BUN  11  CREATININE 0.99  CALCIUM 8.9  GLUCOSE 106*    Discharge Medications:  Allergies as of 08/17/2019      Reactions   Antihistamines, Diphenhydramine-type Tinitus   Codeine Nausea Only   Can tolerate hydrocodone   Gabapentin Rash      Medication List    TAKE these medications   amLODipine 5 MG tablet Commonly known as: NORVASC Take 1 tablet (5 mg total) by mouth daily.   betamethasone dipropionate 0.05 % cream Apply topically 2 (two) times daily. Use thin layer on  affected skin areas   ENZYMATIC DIGESTANT PO Take 1 capsule by mouth daily. Enzymatic Therapy probiotic pearls   estradiol 0.5 MG tablet Commonly known as: ESTRACE Take 1 tablet (0.5 mg total) by mouth daily. What changed:   how much to take  when to take this  additional instructions   GLUCOSAMINE MSM COMPLEX PO Take 1 tablet by mouth daily.   medroxyPROGESTERone 5 MG tablet Commonly known as: PROVERA 1 tab po qd 10 days a month as directed.  Brand name necessary. What changed:   how much to take  how to take this  when to take this  additional instructions   meloxicam 15 MG tablet Commonly known as: MOBIC 1 po qd prn What changed:   how much to take  how to take this  when to take this   MUCINEX PO Take 200 mg by mouth at bedtime.   propranolol 10 MG tablet Commonly known as: INDERAL Take 1 tablet (10 mg total) by mouth 2 (two) times daily. What changed: how much to take   spironolactone 25 MG tablet Commonly known as: ALDACTONE 1/2 tab po qd What changed:   how much to take  how to take this  when to take this  additional instructions   Synthroid 75 MCG tablet Generic drug: levothyroxine Take 1 tablet (75 mcg total) by mouth daily.   Tylenol Arthritis Pain 650 MG CR tablet Generic drug: acetaminophen Take 650 mg by mouth 2 (two) times daily.       Disposition: Home  Discharge Instructions    Diet - low sodium heart healthy   Complete by: As directed    Increase activity slowly   Complete by: As directed      Follow-up Information    [place] Office Follow up.   Specialty: Cardiology Why: 08/29/2019 @ 9:00AM, wound check visit Contact information: [address] [place]       Evans Lance, MD Follow up.   Specialty: Cardiology Why: 11/23/2019 @ 2:15PM Contact information: [address] [place] [address] [phone]            Duration of Discharge Encounter: Greater than 30 minutes including physician time.  Venetia Night, PA-C 08/17/2019 9:30 AM   EP Attending  Patient seen and examined. Agree with the findings as noted above. The patient is doing well after DDD PM insertion. She denies chest pain. CXR looks good and PPM interrogation under my direction demonstrates normal DDD PM function. She will be discharged home with the meds as above with usual followup.  Mikle Bosworth.D.

## 2019-08-16 NOTE — Consult Note (Addendum)
Cardiology Consultation:   Patient ID: DARBEY BORBON MRN: [id number]; DOB: [birth year]  Admit date: 08/15/2019 Date of Consult: 08/16/2019  Primary Care Provider: Carollee Herter, Alferd Apa, DO Primary Cardiologist: None, new to [facility] Primary Electrophysiologist:  None    Patient Profile:   Wendy Velazquez is a 78 y.o. female with a hx of hypothyroidism, melanoma, HTN, very remote and vage h/o a tachycardia 30+ years ago started on BB back then. who is being seen today for the evaluation of advanced heart block at the request of Dr. Aundra Dubin.  History of Present Illness:   Ms. Sessom was admitted to [facility] yesterday with symptomatic bradycardia, advanced heart block.  She noted with longer walks feeling unusually SOB and easily fatigued.  This out of he ordinary for her, she held her propanolol over the weekend without feeling any better took a small dose (1/4tab) the day of her admission.  No CP, palpitations, no near syncope or syncope.  She sought attention by her PMD who found her bradycardiac and referred her to the ER.  She was found in 2:1 AVBlock with V rates 30's, BP stable/elevated.  LABS K+ 3.6, 3.7 BUN/Creat 15/0.99 HS Trop 13, 15 WBC 6.6, 8.3 H/H 14/42 Plts 193 TSH 1.740    Heart Pathway Score:     Past Medical History:  Diagnosis Date  . Arthritis   . Hypertension   . Melanoma ([place])   . MRSA (methicillin resistant staph aureus) culture positive   . Osteopenia   . Rapid heart beat    [facility]  . Transitional cell carcinoma [facility])     Past Surgical History:  Procedure Laterality Date  . ABDOMINAL HYSTERECTOMY  1987   partial  . BACK SURGERY     08/02/2018  . CATARACT EXTRACTION  2004  . EYE SURGERY     cataract b/l  . MELANOMA EXCISION  1977   left leg  . MELANOMA EXCISION  06-24-09   r foot  . SKIN CANCER EXCISION  1999   bladder transitional cell  . teeth implants    . TONSILLECTOMY       Home Medications:  Prior to Admission medications    Medication Sig Start Date End Date Taking? Authorizing Provider  acetaminophen (TYLENOL ARTHRITIS PAIN) 650 MG CR tablet Take 650 mg by mouth 3 (three) times daily as needed.     [provider]  amLODipine (NORVASC) 5 MG tablet Take 1 tablet (5 mg total) by mouth daily. 06/02/19   Ann Held, DO  betamethasone dipropionate (DIPROLENE) 0.05 % cream Apply topically 2 (two) times daily. Use thin layer on affected skin areas 12/10/18   Jodelle Green, FNP  Digestive Enzymes (ENZYMATIC DIGESTANT PO) Take by mouth. Enzymatic Therapy probiotic pearls    [provider]  estradiol (ESTRACE) 0.5 MG tablet Take 1 tablet (0.5 mg total) by mouth daily. 06/02/19   Roma Schanz R, DO  fluticasone (FLONASE) 50 MCG/ACT nasal spray Place 2 sprays into both nostrils daily. 12/24/15   Saguier, Percell Miller, PA-C  Glucos-MSM-C-Mn-Ginger-Willow (GLUCOSAMINE MSM COMPLEX PO) Take 1 tablet by mouth daily.    [provider]  GuaiFENesin (MUCINEX PO) Take 200 mg by mouth at bedtime.    [provider]  medroxyPROGESTERone (PROVERA) 5 MG tablet 1 tab po qd 10 days a month as directed.  Brand name necessary. 06/02/19   Ann Held, DO  meloxicam (MOBIC) 15 MG tablet 1 po qd prn 06/02/19   Lowne  Lyndal Pulley R, DO  propranolol (INDERAL) 10 MG tablet Take 1 tablet (10 mg total) by mouth 2 (two) times daily. 06/02/19   Roma Schanz R, DO  spironolactone (ALDACTONE) 25 MG tablet 1/2 tab po qd 06/02/19   Carollee Herter, Yvonne R, DO  SYNTHROID 75 MCG tablet Take 1 tablet (75 mcg total) by mouth daily. 06/02/19   Ann Held, DO    Inpatient Medications: Scheduled Meds: . chlorhexidine  60 mL Topical Once  . chlorhexidine  60 mL Topical Once  . gentamicin irrigation  80 mg Irrigation On Call  . heparin  5,000 Units Subcutaneous Q8H  . levothyroxine  75 mcg Oral Daily  . spironolactone  12.5 mg Oral Daily   Continuous Infusions: . sodium chloride    . sodium  chloride    .  ceFAZolin (ANCEF) IV     PRN Meds: acetaminophen, nitroGLYCERIN, ondansetron (ZOFRAN) IV, sodium chloride flush  Allergies:    Allergies  Allergen Reactions  . Antihistamines, Diphenhydramine-Type Tinitus  . Codeine     Nausea  Can take Hydromet  . Gabapentin Rash    Social History:   Social History   Socioeconomic History  . Marital status: Widowed    Spouse name: Not on file  . Number of children: Not on file  . Years of education: Not on file  . Highest education level: Not on file  Occupational History  . Occupation: Product/process development scientist: [employer]    Comment: 4 days a week  Social Needs  . Financial resource strain: Not on file  . Food insecurity    Worry: Not on file    Inability: Not on file  . Transportation needs    Medical: Not on file    Non-medical: Not on file  Tobacco Use  . Smoking status: Former Smoker    Packs/day: 0.30    Years: 15.00    Pack years: 4.50    Quit date: 03/15/1981    Years since quitting: 38.4  . Smokeless tobacco: Never Used  Substance and Sexual Activity  . Alcohol use: Yes    Alcohol/week: 1.0 - 2.0 standard drinks    Types: 1 - 2 Glasses of wine per week  . Drug use: No  . Sexual activity: Not Currently    Partners: Male  Lifestyle  . Physical activity    Days per week: Not on file    Minutes per session: Not on file  . Stress: Not on file  Relationships  . Social Herbalist on phone: Not on file    Gets together: Not on file    Attends religious service: Not on file    Active member of club or organization: Not on file    Attends meetings of clubs or organizations: Not on file    Relationship status: Not on file  . Intimate partner violence    Fear of current or ex partner: Not on file    Emotionally abused: Not on file    Physically abused: Not on file    Forced sexual activity: Not on file  Other Topics Concern  . Not on file  Social History Narrative   Exercise---  dancing    Family History:   Family History  Problem Relation Age of Onset  . Diabetes Mother   . Alzheimer's disease Mother   . Dementia Mother   . Stroke Father   . Hypertension Father  ROS:  Please see the history of present illness.  All other ROS reviewed and negative.     Physical Exam/Data:   Vitals:   08/15/19 2300 08/16/19 0054 08/16/19 0055 08/16/19 0643  BP: (!) 154/71 (!) 178/68  (!) 130/48  Pulse: (!) 39 (!) 35  86  Resp: (!) 23 20  16   Temp:  98.1 F (36.7 C)  98.6 F (37 C)  TempSrc:  Oral  Oral  SpO2: 96% 95%  94%  Weight:   76.2 kg 76.2 kg  Height:   5' (1.524 m)     Intake/Output Summary (Last 24 hours) at 08/16/2019 0923 Last data filed at 08/16/2019 0500 Gross per 24 hour  Intake -  Output 550 ml  Net -550 ml   Last 3 Weights 08/16/2019 08/16/2019 08/15/2019  Weight (lbs) 167 lb 15.9 oz 167 lb 15.9 oz 170 lb  Weight (kg) 76.2 kg 76.2 kg 77.111 kg     Body mass index is 32.81 kg/m.  General:  Well nourished, well developed, in no acute distress HEENT: normal Lymph: no adenopathy Neck: no JVD Endocrine:  No thryomegaly Vascular: No carotid bruits Cardiac:  RRR; bradycardic, no significant murmurs appreciated, no g allops or rubs Lungs:  CTA b/l, no wheezing, rhonchi or rales  Abd: soft, nontender  Ext: no edema Musculoskeletal:  No deformities, BUE and BLE strength normal and equal Skin: warm and dry  Neuro:  no gross focal abnormalities noted Psych:  Normal affect, very pleasant    EKG:  The EKG was personally reviewed and demonstrates:    2:1 AVBlock, RBBB, V rate 36 2:1 AVBlock, 42bpm, RBBB 2:1 AVBlock, 32bpm, RBBB   Telemetry:  Telemetry was personally reviewed and demonstrates:   2:1 AVblock 30's  Relevant CV Studies:  No historical cardiac studies (outside of EKGs) D/w Dr. Lovena Le, no need for echo to be done  Laboratory Data:  High Sensitivity Troponin:   Recent Labs  Lab 08/15/19 1644 08/15/19 1905  TROPONINIHS  13 15     Chemistry Recent Labs  Lab 08/15/19 1641 08/16/19 0304  NA 139 139  K 3.6 3.7  CL 103 104  CO2 23 20*  GLUCOSE 132* 106*  BUN 14 11  CREATININE 0.99 0.99  CALCIUM 9.4 8.9  GFRNONAA 55* 55*  GFRAA >60 >60  ANIONGAP 13 15    No results for input(s): PROT, ALBUMIN, AST, ALT, ALKPHOS, BILITOT in the last 168 hours. Hematology Recent Labs  Lab 08/15/19 1641 08/16/19 0304  WBC 6.6 8.3  RBC 4.65 4.54  HGB 14.1 14.1  HCT 44.1 42.6  MCV 94.8 93.8  MCH 30.3 31.1  MCHC 32.0 33.1  RDW 13.0 13.0  PLT 199 193   BNPNo results for input(s): BNP, PROBNP in the last 168 hours.  DDimer No results for input(s): DDIMER in the last 168 hours.   Radiology/Studies:   Dg Chest 2 View Result Date: 08/15/2019 CLINICAL DATA:  Shortness of breath.  Bradycardia. EXAM: CHEST - 2 VIEW COMPARISON:  June 06, 2009 FINDINGS: Platelike opacities in the right mid lung and left base are consistent with scar or atelectasis. Elevation of the right hemidiaphragm persists. The cardiomediastinal silhouette is stable. No pneumothorax. No nodules or masses. No focal infiltrates. IMPRESSION: No active cardiopulmonary disease. Electronically Signed   By: Dorise Bullion III M.D   On: 08/15/2019 17:16    Assessment and Plan:   1. Advanced heart block, 2:1     No reversible causes (off her propanolol  at home as discussed above)     BP stable     RBBB looks old     Dr. Lovena Le has seen and examined the patient, discussed recommendation for PPM implant and rational for it.  Discussed implant procedure, potential risks and benefits, she would like to proceed  I have visited with the patient as well, answered her follow up questions, she remains agreeable to proceed.   2. HTN     Resume BB post pacer  Anticipate discharge tomorrow   For questions or updates, please contact [place] Please consult [email or web address] for contact info under   Signed, Baldwin [place], PA-C  08/16/2019 9:23 AM   EP Attending  Patient seen and examined. Agree with the findings as noted above. The patient is a pleasant 78 yo woman who developed symptomatic heart block over a week ago. The patient has been found to have CHB/2:1 AV block associated with sob. The patient has never had frank syncope. She has a fairly narrow escape rhythm.  I have discussed the indications/risks/benefits/goals/expectations of DDD PM insertion and she wishes to proceed.  Mikle Bosworth.D.

## 2019-08-16 NOTE — Progress Notes (Signed)
Seen and examined by Dr. Aundra Dubin.

## 2019-08-17 ENCOUNTER — Inpatient Hospital Stay (HOSPITAL_COMMUNITY): Payer: Medicare Other

## 2019-08-17 ENCOUNTER — Encounter (HOSPITAL_COMMUNITY): Payer: Self-pay | Admitting: Internal Medicine

## 2019-08-17 DIAGNOSIS — I351 Nonrheumatic aortic (valve) insufficiency: Secondary | ICD-10-CM

## 2019-08-17 DIAGNOSIS — I361 Nonrheumatic tricuspid (valve) insufficiency: Secondary | ICD-10-CM

## 2019-08-17 LAB — ECHOCARDIOGRAM COMPLETE
Height: 60 in
Weight: 2698.43 oz

## 2019-08-17 MED FILL — Lidocaine HCl Local Inj 1%: INTRAMUSCULAR | Qty: 60 | Status: AC

## 2019-08-17 NOTE — Discharge Instructions (Signed)
° ° °  Supplemental Discharge Instructions for  Pacemaker/Defibrillator Patients  Activity No heavy lifting or vigorous activity with your left/right arm for 6 to 8 weeks.  Do not raise your left/right arm above your head for one week.  Gradually raise your affected arm as drawn below.             08/20/2019                  08/21/2019               08/22/2019               08/23/2019 __  NO DRIVING for  1 week  ; you may begin driving on   [id number]  .  WOUND CARE - Keep the wound area clean and dry.  Do not get this area wet for one week. No showers for one week; you may shower on 08/23/2019    . - The tape/steri-strips on your wound will fall off; do not pull them off.  No bandage is needed on the site.  DO  NOT apply any creams, oils, or ointments to the wound area. - If you notice any drainage or discharge from the wound, any swelling or bruising at the site, or you develop a fever > 101? F after you are discharged home, call the office at once.

## 2019-08-17 NOTE — Progress Notes (Signed)
  Echocardiogram 2D Echocardiogram has been performed.  Wendy Velazquez 08/17/2019, 10:55 AM

## 2019-08-17 NOTE — Discharge Summary (Addendum)
Please see note mis-titled "Consult"  from today for discharge summary.  Tommye Standard, PA-C  Mikle Bosworth.D.

## 2019-08-21 ENCOUNTER — Telehealth: Payer: Self-pay | Admitting: Internal Medicine

## 2019-08-21 NOTE — Telephone Encounter (Signed)
New Message:   Please call, she have some questions, had a Pacemaker put in on 08-16-19.

## 2019-08-21 NOTE — Telephone Encounter (Signed)
Spoke with patient. She was reviewing her discharge instructions, wants to clarify instructions. Per pt, d/c instructions say that she may resume driving and showering on 10/7. She asks if she can return to work this day as well. She works in Press photographer for [employer]. Reviewed arm mobility and lifting restrictions until wound check. Pt verbalizes understanding, reports she does mostly desk work and will request assistance from a coworker if she has to do any lifting. Pt denies any additional questions or concerns at this time and thanked me for my call.

## 2019-08-29 ENCOUNTER — Ambulatory Visit (INDEPENDENT_AMBULATORY_CARE_PROVIDER_SITE_OTHER): Payer: Medicare Other | Admitting: Student

## 2019-08-29 ENCOUNTER — Other Ambulatory Visit: Payer: Self-pay

## 2019-08-29 DIAGNOSIS — I441 Atrioventricular block, second degree: Secondary | ICD-10-CM | POA: Diagnosis not present

## 2019-08-29 LAB — CUP PACEART INCLINIC DEVICE CHECK
Battery Remaining Longevity: 57 mo
Battery Voltage: 2.99 V
Brady Statistic RA Percent Paced: 46 %
Brady Statistic RV Percent Paced: 99.85 %
Date Time Interrogation Session: 20201013125430
Implantable Lead Implant Date: 20200930
Implantable Lead Implant Date: 20200930
Implantable Lead Location: 753859
Implantable Lead Location: 753860
Implantable Lead Model: 3830
Implantable Lead Model: 5076
Implantable Pulse Generator Implant Date: 20200930
Lead Channel Impedance Value: 525 Ohm
Lead Channel Impedance Value: 700 Ohm
Lead Channel Pacing Threshold Amplitude: 0.5 V
Lead Channel Pacing Threshold Amplitude: 0.5 V
Lead Channel Pacing Threshold Amplitude: 0.75 V
Lead Channel Pacing Threshold Amplitude: 0.75 V
Lead Channel Pacing Threshold Pulse Width: 0.4 ms
Lead Channel Pacing Threshold Pulse Width: 0.4 ms
Lead Channel Pacing Threshold Pulse Width: 1 ms
Lead Channel Pacing Threshold Pulse Width: 1 ms
Lead Channel Sensing Intrinsic Amplitude: 11 mV
Lead Channel Sensing Intrinsic Amplitude: 4.5 mV
Lead Channel Setting Pacing Amplitude: 3.5 V
Lead Channel Setting Pacing Amplitude: 3.5 V
Lead Channel Setting Pacing Pulse Width: 1 ms
Lead Channel Setting Sensing Sensitivity: 2 mV
Pulse Gen Model: 2272
Pulse Gen Serial Number: 9156742

## 2019-08-29 NOTE — Progress Notes (Signed)
Wound check appointment. Steri-strips removed. Wound without redness or edema. Incision edges approximated, wound well healed. Normal device function. Thresholds, sensing, and impedances consistent with implant measurements. Device programmed at 3.5V/auto capture programmed on for extra safety margin until 3 month visit. HIS Bundle with non-selective pacing down to LOC at 0.25 V @ 1.0 ms. Histogram distribution appropriate for patient and level of activity. No mode switches or high ventricular rates noted. Patient educated about wound care, arm mobility, lifting restrictions. ROV in 3 months with Dr. Lovena Le.  Legrand Como [address] [place], PA-C  08/29/2019 10:06 AM

## 2019-08-30 ENCOUNTER — Encounter: Payer: Self-pay | Admitting: Family Medicine

## 2019-08-31 NOTE — Telephone Encounter (Signed)
I believe the dose of mega red is 1 cap a day but it should say on the bottle

## 2019-09-05 ENCOUNTER — Ambulatory Visit: Payer: Medicare Other

## 2019-09-11 ENCOUNTER — Other Ambulatory Visit: Payer: Self-pay

## 2019-09-12 ENCOUNTER — Encounter: Payer: Self-pay | Admitting: Family Medicine

## 2019-09-12 ENCOUNTER — Ambulatory Visit (INDEPENDENT_AMBULATORY_CARE_PROVIDER_SITE_OTHER): Payer: Medicare Other | Admitting: Family Medicine

## 2019-09-12 VITALS — BP 144/90 | HR 64 | Temp 97.5°F | Resp 18 | Ht 60.0 in | Wt 167.8 lb

## 2019-09-12 DIAGNOSIS — E782 Mixed hyperlipidemia: Secondary | ICD-10-CM | POA: Diagnosis not present

## 2019-09-12 DIAGNOSIS — M810 Age-related osteoporosis without current pathological fracture: Secondary | ICD-10-CM | POA: Insufficient documentation

## 2019-09-12 DIAGNOSIS — M48061 Spinal stenosis, lumbar region without neurogenic claudication: Secondary | ICD-10-CM | POA: Insufficient documentation

## 2019-09-12 DIAGNOSIS — I441 Atrioventricular block, second degree: Secondary | ICD-10-CM | POA: Diagnosis not present

## 2019-09-12 DIAGNOSIS — E559 Vitamin D deficiency, unspecified: Secondary | ICD-10-CM | POA: Insufficient documentation

## 2019-09-12 HISTORY — DX: Vitamin D deficiency, unspecified: E55.9

## 2019-09-12 LAB — COMPREHENSIVE METABOLIC PANEL
ALT: 14 U/L (ref 0–35)
AST: 16 U/L (ref 0–37)
Albumin: 4.5 g/dL (ref 3.5–5.2)
Alkaline Phosphatase: 82 U/L (ref 39–117)
BUN: 22 mg/dL (ref 6–23)
CO2: 34 mEq/L — ABNORMAL HIGH (ref 19–32)
Calcium: 9.9 mg/dL (ref 8.4–10.5)
Chloride: 100 mEq/L (ref 96–112)
Creatinine, Ser: 1.06 mg/dL (ref 0.40–1.20)
GFR: 50.23 mL/min — ABNORMAL LOW (ref >60.00)
Glucose, Bld: 117 mg/dL — ABNORMAL HIGH (ref 70–99)
Potassium: 4.7 mEq/L (ref 3.5–5.1)
Sodium: 141 mEq/L (ref 135–145)
Total Bilirubin: 0.8 mg/dL (ref 0.2–1.2)
Total Protein: 6.4 g/dL (ref 6.0–8.3)

## 2019-09-12 LAB — LIPID PANEL
Cholesterol: 185 mg/dL (ref 0–200)
HDL: 44.6 mg/dL (ref >39.00)
NonHDL: 140.62
Total CHOL/HDL Ratio: 4
Triglycerides: 237 mg/dL — ABNORMAL HIGH (ref 0.0–149.0)
VLDL: 47.4 mg/dL — ABNORMAL HIGH (ref 0.0–40.0)

## 2019-09-12 LAB — LDL CHOLESTEROL, DIRECT: Direct LDL: 99 mg/dL

## 2019-09-12 NOTE — Assessment & Plan Note (Signed)
Per cardiology 

## 2019-09-12 NOTE — Patient Instructions (Signed)
Vitamin D Deficiency Vitamin D deficiency is when your body does not have enough vitamin D. Vitamin D is important to your body for many reasons:  It helps the body absorb two important minerals-calcium and phosphorus.  It plays a role in bone health.  It may help to prevent some diseases, such as diabetes and multiple sclerosis.  It plays a role in muscle function, including heart function. If vitamin D deficiency is severe, it can cause a condition in which your bones become soft. In adults, this condition is called osteomalacia. In children, this condition is called rickets. What are the causes? This condition may be caused by:  Not eating enough foods that contain vitamin D.  Not getting enough natural sun exposure.  Having certain digestive system diseases that make it difficult for your body to absorb vitamin D. These diseases include Crohn's disease, chronic pancreatitis, and cystic fibrosis.  Having a surgery in which a part of the stomach or a part of the small intestine is removed.  Having chronic kidney disease or liver disease. What increases the risk? You are more likely to develop this condition if you:  Are older.  Do not spend much time outdoors.  Live in a long-term care facility.  Have had broken bones.  Have weak or thin bones (osteoporosis).  Have a disease or condition that changes how the body absorbs vitamin D.  Have dark skin.  Take certain medicines, such as steroid medicines or certain seizure medicines.  Are overweight or obese. What are the signs or symptoms? In mild cases of vitamin D deficiency, there may not be any symptoms. If the condition is severe, symptoms may include:  Bone pain.  Muscle pain.  Falling often.  Broken bones caused by a minor injury. How is this diagnosed? This condition may be diagnosed with blood tests. Imaging tests such as X-rays may also be done to look for changes in the bone. How is this treated?  Treatment for this condition may depend on what caused the condition. Treatment options include:  Taking vitamin D supplements. Your health care provider will suggest what dose is best for you.  Taking a calcium supplement. Your health care provider will suggest what dose is best for you. Follow these instructions at home: Eating and drinking   Eat foods that contain vitamin D. Choices include: ? Fortified dairy products, cereals, or juices. Fortified means that vitamin D has been added to the food. Check the label on the package to see if the food is fortified. ? Fatty fish, such as salmon or trout. ? Eggs. ? Oysters. ? Mushrooms. The items listed above may not be a complete list of recommended foods and beverages. Contact a dietitian for more information. General instructions  Take medicines and supplements only as told by your health care provider.  Get regular, safe exposure to natural sunlight.  Do not use a tanning bed.  Maintain a healthy weight. Lose weight if needed.  Keep all follow-up visits as told by your health care provider. This is important. How is this prevented? You can get vitamin D by:  Eating foods that naturally contain vitamin D.  Eating or drinking products that have been fortified with vitamin D, such as cereals, juices, and dairy products (including milk).  Taking a vitamin D supplement or a multivitamin supplement that contains vitamin D.  Being in the sun. Your body naturally makes vitamin D when your skin is exposed to sunlight. Your body changes the sunlight into  a form of the vitamin that it can use. Contact a health care provider if:  Your symptoms do not go away.  You feel nauseous or you vomit.  You have fewer bowel movements than usual or are constipated. Summary  Vitamin D deficiency is when your body does not have enough vitamin D.  Vitamin D is important to your body for good bone health and muscle function, and it may help  prevent some diseases.  Vitamin D deficiency is primarily treated through supplementation. Your health care provider will suggest what dose is best for you.  You can get vitamin D by eating foods that contain vitamin D, by being in the sun, and by taking a vitamin D supplement or a multivitamin supplement that contains vitamin D. This information is not intended to replace advice given to you by your health care provider. Make sure you discuss any questions you have with your health care provider. Document Released: 01/25/2012 Document Revised: 07/11/2018 Document Reviewed: 07/11/2018 Elsevier Patient Education  2020 [employer].

## 2019-09-12 NOTE — Progress Notes (Signed)
Patient ID: Wendy Velazquez, female    DOB: May 31, 1941  Age: 78 y.o. MRN: [id number]    Subjective:  Subjective  HPI Wendy Velazquez presents for f/u lumbar stenosis-- she would like to go back to [place] neuro surg---- she just does not want to have surgery out of town.  It is too difficult to get rides/ help.   She would like to see Dr Vertell Limber ---- I have recommended him before and Dr lomax recommended him as well.   Review of Systems  Constitutional: Negative for appetite change, diaphoresis, fatigue and unexpected weight change.  Eyes: Negative for pain, redness and visual disturbance.  Respiratory: Negative for cough, chest tightness, shortness of breath and wheezing.   Cardiovascular: Negative for chest pain, palpitations and leg swelling.  Endocrine: Negative for cold intolerance, heat intolerance, polydipsia, polyphagia and polyuria.  Genitourinary: Negative for difficulty urinating, dysuria and frequency.  Musculoskeletal: Positive for back pain.  Neurological: Negative for dizziness, light-headedness, numbness and headaches.    History Past Medical History:  Diagnosis Date   Arthritis    Hypertension    Melanoma (Wendy Velazquez)    MRSA (methicillin resistant staph aureus) culture positive    Osteopenia    Rapid heart beat    benigh   Transitional cell carcinoma (Wendy Velazquez)     She has a past surgical history that includes Tonsillectomy; Abdominal hysterectomy (1987); Melanoma excision (1977); Melanoma excision (06-24-09); Cataract extraction (2004); Skin cancer excision (1999); Eye surgery; teeth implants; Back surgery; and PACEMAKER IMPLANT (N/A, 08/16/2019).   Her family history includes Alzheimer's disease in her mother; Dementia in her mother; Diabetes in her mother; Hypertension in her father; Stroke in her father.She reports that she quit smoking about 38 years ago. She has a 4.50 pack-year smoking history. She has never used smokeless tobacco. She reports current alcohol use of  about 1.0 - 2.0 standard drinks of alcohol per week. She reports that she does not use drugs.  Current Outpatient Medications on File Prior to Visit  Medication Sig Dispense Refill   acetaminophen (TYLENOL ARTHRITIS PAIN) 650 MG CR tablet Take 650 mg by mouth 2 (two) times daily.      amLODipine (NORVASC) 5 MG tablet Take 1 tablet (5 mg total) by mouth daily. 90 tablet 3   Cholecalciferol (VITAMIN D-1000 MAX ST) 25 MCG (1000 UT) tablet      Digestive Enzymes (ENZYMATIC DIGESTANT PO) Take 1 capsule by mouth daily. Enzymatic Therapy probiotic pearls      estradiol (ESTRACE) 0.5 MG tablet Take 1 tablet (0.5 mg total) by mouth daily. (Patient taking differently: Take 0.25 mg by mouth See admin instructions. Take 0.25mg  daily for 25 days then switch to Provera.) 90 tablet 1   Glucos-MSM-C-Mn-Ginger-Willow (GLUCOSAMINE MSM COMPLEX PO) Take 1 tablet by mouth daily.     GuaiFENesin (MUCINEX PO) Take 200 mg by mouth at bedtime.     medroxyPROGESTERone (PROVERA) 5 MG tablet 1 tab po qd 10 days a month as directed.  Brand name necessary. (Patient taking differently: Take 2.5 mg by mouth See admin instructions. Take 2.5mg  daily for ten days each month then switch to estradiol. Brand name necessary.) 90 tablet 3   MegaRed Omega-3 Krill Oil 500 MG CAPS      meloxicam (MOBIC) 15 MG tablet 1 po qd prn (Patient taking differently: Take 7.5 mg by mouth daily. 1 po qd prn) 90 tablet 1   propranolol (INDERAL) 10 MG tablet Take 1 tablet (10 mg total) by mouth 2 (  two) times daily. (Patient taking differently: Take 2.5 mg by mouth 2 (two) times daily. ) 180 tablet 3   spironolactone (ALDACTONE) 25 MG tablet 1/2 tab po qd (Patient taking differently: Take 12.5 mg by mouth daily. ) 45 tablet 3   SYNTHROID 75 MCG tablet Take 1 tablet (75 mcg total) by mouth daily. 90 tablet 3   betamethasone dipropionate (DIPROLENE) 0.05 % cream Apply topically 2 (two) times daily. Use thin layer on affected skin areas (Patient  not taking: Reported on 08/16/2019) 45 g 0   No current facility-administered medications on file prior to visit.      Objective:  Objective  Physical Exam Vitals signs and nursing note reviewed.  Constitutional:      Appearance: She is well-developed.  HENT:     Head: Normocephalic and atraumatic.  Eyes:     Conjunctiva/sclera: Conjunctivae normal.  Neck:     Musculoskeletal: Normal range of motion and neck supple.     Thyroid: No thyromegaly.     Vascular: No carotid bruit or JVD.  Cardiovascular:     Rate and Rhythm: Normal rate and regular rhythm.     Heart sounds: Normal heart sounds. No murmur.  Pulmonary:     Effort: Pulmonary effort is normal. No respiratory distress.     Breath sounds: Normal breath sounds. No wheezing or rales.  Chest:     Chest wall: No tenderness.  Neurological:     Mental Status: She is alert and oriented to person, place, and time.    BP (!) 144/90 (BP Location: Left Arm, Patient Position: Sitting, Cuff Size: Normal)    Pulse 64    Temp (!) 97.5 F (36.4 C) (Temporal)    Resp 18    Ht 5' (1.524 m)    Wt 167 lb 12.8 oz (76.1 kg)    SpO2 98%    BMI 32.77 kg/m  Wt Readings from Last 3 Encounters:  09/12/19 167 lb 12.8 oz (76.1 kg)  08/17/19 168 lb 10.4 oz (76.5 kg)  08/15/19 171 lb 3.2 oz (77.7 kg)     Lab Results  Component Value Date   WBC 8.3 08/16/2019   HGB 14.1 08/16/2019   HCT 42.6 08/16/2019   PLT 193 08/16/2019   GLUCOSE 106 (H) 08/16/2019   CHOL 149 06/02/2019   TRIG 171.0 (H) 06/02/2019   HDL 39.40 06/02/2019   LDLDIRECT 102.0 04/18/2015   LDLCALC 76 06/02/2019   ALT 12 06/02/2019   AST 16 06/02/2019   NA 139 08/16/2019   K 3.7 08/16/2019   CL 104 08/16/2019   CREATININE 0.99 08/16/2019   BUN 11 08/16/2019   CO2 20 (L) 08/16/2019   TSH 1.740 08/15/2019   MICROALBUR 2.0 (H) 04/18/2015    Dg Chest 2 View  Result Date: 08/15/2019 CLINICAL DATA:  Shortness of breath.  Bradycardia. EXAM: CHEST - 2 VIEW COMPARISON:   June 06, 2009 FINDINGS: Platelike opacities in the right mid lung and left base are consistent with scar or atelectasis. Elevation of the right hemidiaphragm persists. The cardiomediastinal silhouette is stable. No pneumothorax. No nodules or masses. No focal infiltrates. IMPRESSION: No active cardiopulmonary disease. Electronically Signed   By: Dorise Bullion III M.D   On: 08/15/2019 17:16     Assessment & Plan:  Plan  I am having Horris Latino A. Fidalgo maintain her acetaminophen, Digestive Enzymes (ENZYMATIC DIGESTANT PO), Glucos-MSM-C-Mn-Ginger-Willow (GLUCOSAMINE MSM COMPLEX PO), GuaiFENesin (MUCINEX PO), betamethasone dipropionate, amLODipine, medroxyPROGESTERone, propranolol, spironolactone, Synthroid, estradiol, meloxicam, Vitamin D-1000  Max St, and MegaRed Omega-3 [employer].  No orders of the defined types were placed in this encounter.   Problem List Items Addressed This Visit      Unprioritized   Mixed hyperlipidemia    Encouraged heart healthy diet, increase exercise, avoid trans fats, consider a krill oil cap daily      Relevant Orders   Lipid panel   Mobitz type II atrioventricular block    Per cardiology      Osteoporosis without current pathological fracture    Pt has refused treatment  Check vita D-- she is taking 1000u daily        Relevant Medications   Cholecalciferol (VITAMIN D-1000 MAX ST) 25 MCG (1000 UT) tablet   Other Relevant Orders   Comprehensive metabolic panel   Vitamin D (25 hydroxy)   Spinal stenosis of lumbar region - Primary   Relevant Orders   Ambulatory referral to Neurosurgery   Vitamin D deficiency   Relevant Orders   Comprehensive metabolic panel   Vitamin D (25 hydroxy)      Follow-up: Return in about 6 months (around 03/12/2020), or if symptoms worsen or fail to improve, for hypertension, hyperlipidemia.  Ann Held, DO

## 2019-09-12 NOTE — Assessment & Plan Note (Signed)
Encouraged heart healthy diet, increase exercise, avoid trans fats, consider a krill oil cap daily 

## 2019-09-12 NOTE — Assessment & Plan Note (Addendum)
Pt has refused treatment  Check vita D-- she is taking 1000u daily

## 2019-09-14 LAB — VITAMIN D 25 HYDROXY (VIT D DEFICIENCY, FRACTURES): VITD: 25.4 ng/mL — ABNORMAL LOW (ref 30.00–100.00)

## 2019-09-18 ENCOUNTER — Other Ambulatory Visit: Payer: Self-pay

## 2019-09-18 ENCOUNTER — Telehealth: Payer: Self-pay

## 2019-09-18 MED ORDER — VITAMIN D (ERGOCALCIFEROL) 1.25 MG (50000 UNIT) PO CAPS: 50000.0000 [IU] | ORAL_CAPSULE | ORAL | 1 refills | Status: DC

## 2019-09-18 NOTE — Telephone Encounter (Signed)
Spoke with patient. Seems to be describing a stitch. No signs/symptoms of infection. Agreeable to a DC appointment on 09/19/19 at 9:30am. Aware of [place] visitor restrictions and mask requirement.  Advise pt it is safe to use a massage chair at the [facility] long as it is in good working order and properly grounded. Explained if any equipment ever makes her feel poorly, discontinue use and call our office. Pt verbalizes understanding, denies additional questions at this time.

## 2019-09-18 NOTE — Telephone Encounter (Signed)
The pt states she notice a white space in the incision and the size of the end of a straight pin. The pt wants to know if it safe to use the massage chair to get a pedicure.

## 2019-09-19 ENCOUNTER — Ambulatory Visit (INDEPENDENT_AMBULATORY_CARE_PROVIDER_SITE_OTHER): Payer: Medicare Other | Admitting: *Deleted

## 2019-09-19 ENCOUNTER — Other Ambulatory Visit: Payer: Self-pay

## 2019-09-19 DIAGNOSIS — I441 Atrioventricular block, second degree: Secondary | ICD-10-CM

## 2019-09-19 DIAGNOSIS — Z950 Presence of cardiac pacemaker: Secondary | ICD-10-CM

## 2019-09-19 NOTE — Progress Notes (Signed)
Wound recheck in clinic, added-on for stitch reported at incision. Stitch removed from medial incision, antibiotic ointment and bandaid applied. Incision edges otherwise fully approximated and well healed. Patient instructed to remove bandaid, wash site with soap and water when bathing, then leave OTA. Reviewed signs/symptoms of infection, patient aware to call if any concerns. ROV with Dr. Lovena Le on 11/23/19.

## 2019-09-19 NOTE — Patient Instructions (Signed)
Call the [facility] at [phone] if you notice any drainage, redness, swelling, fever, or chills, or if you notice another stitch at your incision.

## 2019-10-02 DIAGNOSIS — M9983 Other biomechanical lesions of lumbar region: Secondary | ICD-10-CM | POA: Diagnosis not present

## 2019-10-02 DIAGNOSIS — M419 Scoliosis, unspecified: Secondary | ICD-10-CM | POA: Diagnosis not present

## 2019-10-02 DIAGNOSIS — M48061 Spinal stenosis, lumbar region without neurogenic claudication: Secondary | ICD-10-CM | POA: Diagnosis not present

## 2019-10-02 DIAGNOSIS — M5416 Radiculopathy, lumbar region: Secondary | ICD-10-CM | POA: Diagnosis not present

## 2019-10-03 ENCOUNTER — Ambulatory Visit (INDEPENDENT_AMBULATORY_CARE_PROVIDER_SITE_OTHER): Payer: Medicare Other | Admitting: *Deleted

## 2019-10-03 ENCOUNTER — Other Ambulatory Visit: Payer: Self-pay

## 2019-10-03 DIAGNOSIS — Z23 Encounter for immunization: Secondary | ICD-10-CM | POA: Diagnosis not present

## 2019-10-03 NOTE — Progress Notes (Signed)
Patient here for shingles vaccine.  Explain again to patient about the cost and that this vaccine is not covered if given in the office.  She stated that her supplemental insurance [employer] covers at 100%.  ABN form given and sign by patient.    Vaccine given in right deltoid and patient tolerated well.   Second vaccine scheduled for 03/13/19.

## 2019-10-11 ENCOUNTER — Telehealth: Payer: Self-pay | Admitting: Emergency Medicine

## 2019-10-11 NOTE — Telephone Encounter (Signed)
Patient called stating she thinks she has a suture that has worked it's way to the surface from PPM. No pain or signs of infection. Will see patient in device clinic on December 2 to evaluate.

## 2019-10-18 ENCOUNTER — Other Ambulatory Visit: Payer: Self-pay

## 2019-10-18 ENCOUNTER — Ambulatory Visit (INDEPENDENT_AMBULATORY_CARE_PROVIDER_SITE_OTHER): Payer: Medicare Other | Admitting: *Deleted

## 2019-10-18 DIAGNOSIS — I442 Atrioventricular block, complete: Secondary | ICD-10-CM

## 2019-10-18 NOTE — Patient Instructions (Signed)
Call office if you have any redness, drainage or edema at site pf incision.

## 2019-10-18 NOTE — Progress Notes (Signed)
Pin hole sized scab found at edge of healed incision site. No redness, drainage or edema. Will call office if she develops any s/sx of infection.

## 2019-10-25 DIAGNOSIS — M9983 Other biomechanical lesions of lumbar region: Secondary | ICD-10-CM | POA: Diagnosis not present

## 2019-10-25 DIAGNOSIS — M5416 Radiculopathy, lumbar region: Secondary | ICD-10-CM | POA: Diagnosis not present

## 2019-10-25 DIAGNOSIS — M7138 Other bursal cyst, other site: Secondary | ICD-10-CM | POA: Diagnosis not present

## 2019-10-25 DIAGNOSIS — M419 Scoliosis, unspecified: Secondary | ICD-10-CM | POA: Diagnosis not present

## 2019-10-25 DIAGNOSIS — M48061 Spinal stenosis, lumbar region without neurogenic claudication: Secondary | ICD-10-CM | POA: Diagnosis not present

## 2019-10-25 DIAGNOSIS — M4120 Other idiopathic scoliosis, site unspecified: Secondary | ICD-10-CM | POA: Diagnosis not present

## 2019-10-26 ENCOUNTER — Telehealth: Payer: Self-pay | Admitting: *Deleted

## 2019-10-26 ENCOUNTER — Other Ambulatory Visit: Payer: Self-pay | Admitting: Neurosurgery

## 2019-10-26 NOTE — Telephone Encounter (Signed)
   Primary Cardiologist:No primary care provider on file.  Primary Electrophysiologist: Dr. Lovena Le  Chart reviewed as part of pre-operative protocol coverage. Miss Wendy Velazquez has an upcoming appointment 11/23/2019 with Dr. Lovena Le. Will confirm cardiac clearance at that office visit.   Called and spoke with [name]. She reports no anginal symptoms - denies chest pain, pressure, DOE, SOB. Reports feeling well since her pacemaker was placed. Checks her BP and HR regularly at home and reports they are good. Walks stairs regularly and works 4 days per week with no difficulty.   I have updated the appointment note for her visit with Dr. Lovena Le to notate that surgical clearance should be evaluated.   Loel Dubonnet, NP  10/26/2019, 1:34 PM

## 2019-10-26 NOTE — Telephone Encounter (Signed)
I will forward clearance notes to Dr. Lovena Le for upcoming appt. I will also send as FYI to surgeon.

## 2019-10-26 NOTE — Telephone Encounter (Signed)
   [facility] Pre-operative Risk Assessment    Request for surgical clearance:  1. What type of surgery is being performed? LEFT L2-3, L3-4, L4-5 ANTEROLATERAL LUMBAR INTERBODY FUSION w/PERCUTANEOUS PEDICLE SCREWS   2. When is this surgery scheduled? 12/19/19   3. What type of clearance is required (medical clearance vs. Pharmacy clearance to hold med vs. Both)? MEDICAL  4. Are there any medications that need to be held prior to surgery and how long? NONE LISTED   5. Practice name and name of physician performing surgery? [facility]; DR. Broadus John STERN  6. What is your office phone number [phone]    7.   What is your office fax number [phone]  8.   Anesthesia type (None, local, MAC, general) ? GENERAL   Julaine Hua 10/26/2019, 12:30 PM  _________________________________________________________________   (provider comments below)

## 2019-11-14 ENCOUNTER — Encounter: Payer: Self-pay | Admitting: *Deleted

## 2019-11-15 ENCOUNTER — Ambulatory Visit (INDEPENDENT_AMBULATORY_CARE_PROVIDER_SITE_OTHER): Payer: Medicare Other | Admitting: *Deleted

## 2019-11-15 DIAGNOSIS — I441 Atrioventricular block, second degree: Secondary | ICD-10-CM | POA: Diagnosis not present

## 2019-11-15 LAB — CUP PACEART REMOTE DEVICE CHECK
Battery Remaining Longevity: 55 mo
Battery Remaining Percentage: 95.5 %
Battery Voltage: 2.99 V
Brady Statistic AP VP Percent: 42 %
Brady Statistic AP VS Percent: 1 %
Brady Statistic AS VP Percent: 58 %
Brady Statistic AS VS Percent: 1 %
Brady Statistic RA Percent Paced: 41 %
Brady Statistic RV Percent Paced: 99 %
Date Time Interrogation Session: 20201230020017
Implantable Lead Implant Date: 20200930
Implantable Lead Implant Date: 20200930
Implantable Lead Location: 753859
Implantable Lead Location: 753860
Implantable Lead Model: 3830
Implantable Lead Model: 5076
Implantable Pulse Generator Implant Date: 20200930
Lead Channel Impedance Value: 440 Ohm
Lead Channel Impedance Value: 660 Ohm
Lead Channel Pacing Threshold Amplitude: 0.5 V
Lead Channel Pacing Threshold Amplitude: 0.75 V
Lead Channel Pacing Threshold Pulse Width: 0.4 ms
Lead Channel Pacing Threshold Pulse Width: 1 ms
Lead Channel Sensing Intrinsic Amplitude: 12 mV
Lead Channel Sensing Intrinsic Amplitude: 3.1 mV
Lead Channel Setting Pacing Amplitude: 3.5 V
Lead Channel Setting Pacing Amplitude: 3.5 V
Lead Channel Setting Pacing Pulse Width: 1 ms
Lead Channel Setting Sensing Sensitivity: 2 mV
Pulse Gen Model: 2272
Pulse Gen Serial Number: 9156742

## 2019-11-23 ENCOUNTER — Other Ambulatory Visit: Payer: Self-pay

## 2019-11-23 ENCOUNTER — Ambulatory Visit (INDEPENDENT_AMBULATORY_CARE_PROVIDER_SITE_OTHER): Payer: Medicare Other | Admitting: Internal Medicine

## 2019-11-23 ENCOUNTER — Encounter: Payer: Self-pay | Admitting: Internal Medicine

## 2019-11-23 VITALS — BP 176/104 | HR 66 | Ht 62.0 in | Wt 175.2 lb

## 2019-11-23 DIAGNOSIS — Z950 Presence of cardiac pacemaker: Secondary | ICD-10-CM | POA: Insufficient documentation

## 2019-11-23 DIAGNOSIS — I441 Atrioventricular block, second degree: Secondary | ICD-10-CM

## 2019-11-23 MED ORDER — APIXABAN 5 MG PO TABS: 5.0000 mg | ORAL_TABLET | Freq: Two times a day (BID) | ORAL | 0 refills | Status: DC

## 2019-11-23 MED ORDER — APIXABAN 5 MG PO TABS: 5.0000 mg | ORAL_TABLET | Freq: Two times a day (BID) | ORAL | 11 refills | Status: DC

## 2019-11-23 NOTE — Progress Notes (Signed)
HPI [name] returns today for ongoing evaluation and management of her PPM in the setting of Mobitz 2 heart block. She also had HTN. She has done well in the interim from a cardiac perspective. She is pending spinal surgery. She does not have much in the way of palpitations.  Allergies  Allergen Reactions  . Antihistamines, Diphenhydramine-Type Tinitus  . Codeine Nausea Only    Can tolerate hydrocodone  . Gabapentin Rash     Current Outpatient Medications  Medication Sig Dispense Refill  . acetaminophen (TYLENOL ARTHRITIS PAIN) 650 MG CR tablet Take 650 mg by mouth 2 (two) times daily.     Marland Kitchen amLODipine (NORVASC) 5 MG tablet Take 1 tablet (5 mg total) by mouth daily. 90 tablet 3  . betamethasone dipropionate (DIPROLENE) 0.05 % cream Apply topically 2 (two) times daily. Use thin layer on affected skin areas 45 g 0  . Cholecalciferol (VITAMIN D-1000 MAX ST) 25 MCG (1000 UT) tablet Take 1,000 Units by mouth daily.     . Digestive Enzymes (ENZYMATIC DIGESTANT PO) Take 1 capsule by mouth daily. Enzymatic Therapy probiotic pearls     . estradiol (ESTRACE) 0.5 MG tablet Take 1 tablet (0.5 mg total) by mouth daily. 90 tablet 1  . Glucos-MSM-C-Mn-Ginger-Willow (GLUCOSAMINE MSM COMPLEX PO) Take 1 tablet by mouth daily.    . GuaiFENesin (MUCINEX PO) Take 200 mg by mouth at bedtime.    . medroxyPROGESTERone (PROVERA) 5 MG tablet 1 tab po qd 10 days a month as directed.  Brand name necessary. 90 tablet 3  . meloxicam (MOBIC) 15 MG tablet 1 po qd prn 90 tablet 1  . propranolol (INDERAL) 10 MG tablet Take 1 tablet (10 mg total) by mouth 2 (two) times daily. 180 tablet 3  . spironolactone (ALDACTONE) 25 MG tablet 1/2 tab po qd 45 tablet 3  . SYNTHROID 75 MCG tablet Take 1 tablet (75 mcg total) by mouth daily. 90 tablet 3  . apixaban (ELIQUIS) 5 MG TABS tablet Take 1 tablet (5 mg total) by mouth 2 (two) times daily. 60 tablet 11   No current facility-administered medications for this visit.       Past Medical History:  Diagnosis Date  . Arthritis   . HEARING LOSS, BILATERAL 03/02/2008   Qualifier: Diagnosis of  By: Jerold Coombe    . Hx of cyst of breast 07/21/2012  . Hypertension   . Hypothyroidism 03/02/2008   Qualifier: Diagnosis of  By: Jerold Coombe    . Melanoma ([place])   . Mobitz type II atrioventricular block 08/15/2019  . MRSA (methicillin resistant staph aureus) culture positive   . Osteopenia   . Rapid heart beat    benigh  . Transitional cell carcinoma ([place])   . Vitamin D deficiency 09/12/2019    ROS:   All systems reviewed and negative except as noted in the HPI.   Past Surgical History:  Procedure Laterality Date  . ABDOMINAL HYSTERECTOMY  1987   partial  . BACK SURGERY     08/02/2018  . CATARACT EXTRACTION  2004  . EYE SURGERY     cataract b/l  . MELANOMA EXCISION  1977   left leg  . MELANOMA EXCISION  06-24-09   r foot  . PACEMAKER IMPLANT N/A 08/16/2019   Procedure: PACEMAKER IMPLANT;  Surgeon: Evans Lance, MD;  Location: [facility];  Service: Cardiovascular;  Laterality: N/A;  . SKIN CANCER EXCISION  1999   bladder  transitional cell  . teeth implants    . TONSILLECTOMY       Family History  Problem Relation Age of Onset  . Diabetes Mother   . Alzheimer's disease Mother   . Dementia Mother   . Stroke Father   . Hypertension Father      Social History   Socioeconomic History  . Marital status: Widowed    Spouse name: Not on file  . Number of children: Not on file  . Years of education: Not on file  . Highest education level: Not on file  Occupational History  . Occupation: Product/process development scientist: [employer]    Comment: 4 days a week  Tobacco Use  . Smoking status: Former Smoker    Packs/day: 0.30    Years: 15.00    Pack years: 4.50    Quit date: 03/15/1981    Years since quitting: 38.7  . Smokeless tobacco: Never Used  Substance and Sexual Activity  . Alcohol use: Yes    Alcohol/week: 1.0 -  2.0 standard drinks    Types: 1 - 2 Glasses of wine per week  . Drug use: No  . Sexual activity: Not Currently    Partners: Male  Other Topics Concern  . Not on file  Social History Narrative   Exercise--- dancing   Social Determinants of Health   Financial Resource Strain:   . Difficulty of Paying Living Expenses: Not on file  Food Insecurity:   . Worried About Charity fundraiser in the Last Year: Not on file  . Ran Out of Food in the Last Year: Not on file  Transportation Needs:   . Lack of Transportation (Medical): Not on file  . Lack of Transportation (Non-Medical): Not on file  Physical Activity:   . Days of Exercise per Week: Not on file  . Minutes of Exercise per Session: Not on file  Stress:   . Feeling of Stress : Not on file  Social Connections:   . Frequency of Communication with Friends and Family: Not on file  . Frequency of Social Gatherings with Friends and Family: Not on file  . Attends Religious Services: Not on file  . Active Member of Clubs or Organizations: Not on file  . Attends Archivist Meetings: Not on file  . Marital Status: Not on file  Intimate Partner Violence:   . Fear of Current or Ex-Partner: Not on file  . Emotionally Abused: Not on file  . Physically Abused: Not on file  . Sexually Abused: Not on file     BP (!) 176/104   Pulse 66   Ht 5\' 2"  (1.575 m)   Wt 175 lb 3.2 oz (79.5 kg)   SpO2 97%   BMI 32.04 kg/m   Physical Exam:  Well appearing NAD HEENT: Unremarkable Neck:  No JVD, no thyromegally Lymphatics:  No adenopathy Back:  No CVA tenderness Lungs:  Clear with no wheezes HEART:  Regular rate rhythm, no murmurs, no rubs, no clicks Abd:  soft, positive bowel sounds, no organomegally, no rebound, no guarding Ext:  2 plus pulses, no edema, no cyanosis, no clubbing Skin:  No rashes no nodules Neuro:  CN II through XII intact, motor grossly intact   DEVICE  Normal device function.  See PaceArt for details.    Assess/Plan: 1. Preoperative evaluation - she is an acceptable risk for back surgery. While she is sedentary she is low risk for major obstructive disease.  She may proceed with spinal surgery. 2. CHB - she is asymptomatic, s/p PPM insertion.   3. PPM- her St. Jude DDD PM is working normally. We will recheck in several months. Outputs have been turned down. 4. PAF - she has had up to an hour of PAF. I have recommended that she start systemic anti-coagulation after her spinal surgery when her doctor thinks that her bleeding risk is acceptable. If her surgery ends up being delayed by Covid, then she should start Eliquis and hold 3 days prior to spinal surgery.   Mikle Bosworth.D.

## 2019-11-23 NOTE — Patient Instructions (Addendum)
Medication Instructions:  Your physician has recommended you make the following change in your medication:   1.  Start taking AFTER your back surgery:  Eliquis 5 mg one tablet by mouth twice a day.  Labwork: None ordered.  Testing/Procedures: None ordered.  Follow-Up: Your physician wants you to follow-up in: one year with Dr. Lovena Le.   You will receive a reminder letter in the mail two months in advance. If you don't receive a letter, please call our office to schedule the follow-up appointment.  Remote monitoring is used to monitor your Pacemaker from home. This monitoring reduces the number of office visits required to check your device to one time per year. It allows [place] to keep an eye on the functioning of your device to ensure it is working properly. You are scheduled for a device check from home on 02/14/2020. You may send your transmission at any time that day. If you have a wireless device, the transmission will be sent automatically. After your physician reviews your transmission, you will receive a postcard with your next transmission date.  Any Other Special Instructions Will Be Listed Below (If Applicable).  If you need a refill on your cardiac medications before your next appointment, please call your pharmacy.   Apixaban oral tablets What is this medicine? APIXABAN (a PIX a ban) is an anticoagulant (blood thinner). It is used to lower the chance of stroke in people with a medical condition called atrial fibrillation. It is also used to treat or prevent blood clots in the lungs or in the veins. This medicine may be used for other purposes; ask your health care provider or pharmacist if you have questions. COMMON BRAND NAME(S): Eliquis What should I tell my health care provider before I take this medicine? They need to know if you have any of these conditions:  antiphospholipid antibody syndrome  bleeding disorders  bleeding in the brain  blood in your stools (black or  tarry stools) or if you have blood in your vomit  history of blood clots  history of stomach bleeding  kidney disease  liver disease  mechanical heart valve  an unusual or allergic reaction to apixaban, other medicines, foods, dyes, or preservatives  pregnant or trying to get pregnant  breast-feeding How should I use this medicine? Take this medicine by mouth with a glass of water. Follow the directions on the prescription label. You can take it with or without food. If it upsets your stomach, take it with food. Take your medicine at regular intervals. Do not take it more often than directed. Do not stop taking except on your doctor's advice. Stopping this medicine may increase your risk of a blood clot. Be sure to refill your prescription before you run out of medicine. Talk to your pediatrician regarding the use of this medicine in children. Special care may be needed. Overdosage: If you think you have taken too much of this medicine contact a poison control center or emergency room at once. NOTE: This medicine is only for you. Do not share this medicine with others. What if I miss a dose? If you miss a dose, take it as soon as you can. If it is almost time for your next dose, take only that dose. Do not take double or extra doses. What may interact with this medicine? This medicine may interact with the following:  aspirin and aspirin-like medicines  certain medicines for fungal infections like ketoconazole and itraconazole  certain medicines for seizures like carbamazepine  and phenytoin  certain medicines that treat or prevent blood clots like warfarin, enoxaparin, and dalteparin  clarithromycin  NSAIDs, medicines for pain and inflammation, like ibuprofen or naproxen  rifampin  ritonavir  St. John's wort This list may not describe all possible interactions. Give your health care provider a list of all the medicines, herbs, non-prescription drugs, or dietary supplements  you use. Also tell them if you smoke, drink alcohol, or use illegal drugs. Some items may interact with your medicine. What should I watch for while using this medicine? Visit your healthcare professional for regular checks on your progress. You may need blood work done while you are taking this medicine. Your condition will be monitored carefully while you are receiving this medicine. It is important not to miss any appointments. Avoid sports and activities that might cause injury while you are using this medicine. Severe falls or injuries can cause unseen bleeding. Be careful when using sharp tools or knives. Consider using an Copy. Take special care brushing or flossing your teeth. Report any injuries, bruising, or red spots on the skin to your healthcare professional. If you are going to need surgery or other procedure, tell your healthcare professional that you are taking this medicine. Wear a medical ID bracelet or chain. Carry a card that describes your disease and details of your medicine and dosage times. What side effects may I notice from receiving this medicine? Side effects that you should report to your doctor or health care professional as soon as possible:  allergic reactions like skin rash, itching or hives, swelling of the face, lips, or tongue  signs and symptoms of bleeding such as bloody or black, tarry stools; red or dark-brown urine; spitting up blood or brown material that looks like coffee grounds; red spots on the skin; unusual bruising or bleeding from the eye, gums, or nose  signs and symptoms of a blood clot such as chest pain; shortness of breath; pain, swelling, or warmth in the leg  signs and symptoms of a stroke such as changes in vision; confusion; trouble speaking or understanding; severe headaches; sudden numbness or weakness of the face, arm or leg; trouble walking; dizziness; loss of coordination This list may not describe all possible side effects.  Call your doctor for medical advice about side effects. You may report side effects to FDA at 1-800-FDA-1088. Where should I keep my medicine? Keep out of the reach of children. Store at room temperature between 20 and 25 degrees C (68 and 77 degrees F). Throw away any unused medicine after the expiration date. NOTE: This sheet is a summary. It may not cover all possible information. If you have questions about this medicine, talk to your doctor, pharmacist, or health care provider.  2020 Elsevier/Gold Standard (2018-07-13 17:39:34)

## 2019-11-24 ENCOUNTER — Telehealth: Payer: Self-pay | Admitting: Internal Medicine

## 2019-11-24 NOTE — Telephone Encounter (Signed)
New message:      Patient calling and would like to speak with some one concerning her device. Please call patient back.

## 2019-11-24 NOTE — Telephone Encounter (Signed)
Spoke with patient. Discussed PAF episodes that were detected on PPM report, also discussed with Dr. Lovena Le at appointment yesterday. Pt concerned about taking Eliquis and PRN meloxicam together, reports that Dr. Lovena Le reassured her it was fine but she will plan to discuss with PCP as well.  Pt expressed concerns about check-in process in lobby yesterday. She requests to speak with a manager about her concerns. Will forward message to [facility], Production assistant, radio, and [employer], Environmental education officer, for assistance. Pt denies additional questions or concerns at this time.

## 2019-11-24 NOTE — Telephone Encounter (Signed)
The pt states she talked to Dr. Lovena Le and he told her that she had some A-fib on her 11/15/2019 transmission. I let her speak with Sheran Luz.

## 2019-12-03 ENCOUNTER — Encounter: Payer: Self-pay | Admitting: Family Medicine

## 2019-12-14 NOTE — Progress Notes (Signed)
[facility] [place] [facility] [address] [place] [address] Phone: [phone] Fax: [phone]  [id number] [place] [place] [facility] [address] [place] [address] Phone: [phone] Fax: [phone]      Your procedure is scheduled on December 19, 2019.  Report to [facility] Main Entrance "A" at 5:30 A.M., and check in at the Admitting office.  Call this number if you have problems the morning of surgery:  [phone]  Call [phone] if you have any questions prior to your surgery date Monday-Friday 8am-4pm    Remember:  Do not eat or drink after midnight the night before your surgery    Take these medicines the morning of surgery with A SIP OF WATER: amLODipine (NORVASC) propranolol (INDERAL) SYNTHROID acetaminophen (TYLENOL ARTHRITIS PAIN) ARTIFICIAL TEAR - as needed  As of today, STOP taking any Aspirin (unless otherwise instructed by your surgeon), Aleve, Naproxen, Ibuprofen, Motrin, Advil, Goody's, BC's, all herbal medications, fish oil, and all vitamins.    The Morning of Surgery  Do not wear jewelry, make-up or nail polish.  Do not wear lotions, powders, or perfumes or deodorant  Do not shave 48 hours prior to surgery.    Do not bring valuables to the hospital.  [facility] is not responsible for any belongings or valuables.  If you are a smoker, DO NOT Smoke 24 hours prior to surgery  If you wear a CPAP at night please bring your mask the morning of surgery   Remember that you must have someone to transport you home after your surgery, and remain with you for 24 hours if you are discharged the same day.   Please bring cases for contacts, glasses, hearing aids, dentures or bridgework because it cannot be worn into surgery.    Leave your suitcase in the car.  After surgery it may be brought to your room.  For patients admitted to  the hospital, discharge time will be determined by your treatment team.  Patients discharged the day of surgery will not be allowed to drive home.    Special instructions:   [place]- Preparing For Surgery  Before surgery, you can play an important role. Because skin is not sterile, your skin needs to be as free of germs as possible. You can reduce the number of germs on your skin by washing with CHG (chlorahexidine gluconate) Soap before surgery.  CHG is an antiseptic cleaner which kills germs and bonds with the skin to continue killing germs even after washing.    Oral Hygiene is also important to reduce your risk of infection.  Remember - BRUSH YOUR TEETH THE MORNING OF SURGERY WITH YOUR REGULAR TOOTHPASTE  Please do not use if you have an allergy to CHG or antibacterial soaps. If your skin becomes reddened/irritated stop using the CHG.  Do not shave (including legs and underarms) for at least 48 hours prior to first CHG shower. It is OK to shave your face.  Please follow these instructions carefully.   1. Shower the NIGHT BEFORE SURGERY and the MORNING OF SURGERY with CHG Soap.   2. If you chose to wash your hair, wash your hair first as usual with your normal shampoo.  3. After you shampoo, rinse your hair and body thoroughly to remove the shampoo.  4. Use CHG as you would any other liquid soap. You can apply CHG directly to the skin  and wash gently with a scrungie or a clean washcloth.   5. Apply the CHG Soap to your body ONLY FROM THE NECK DOWN.  Do not use on open wounds or open sores. Avoid contact with your eyes, ears, mouth and genitals (private parts). Wash Face and genitals (private parts)  with your normal soap.   6. Wash thoroughly, paying special attention to the area where your surgery will be performed.  7. Thoroughly rinse your body with warm water from the neck down.  8. DO NOT shower/wash with your normal soap after using and rinsing off the CHG Soap.  9. Pat  yourself dry with a CLEAN TOWEL.  10. Wear CLEAN PAJAMAS to bed the night before surgery, wear comfortable clothes the morning of surgery  11. Place CLEAN SHEETS on your bed the night of your first shower and DO NOT SLEEP WITH PETS.    Day of Surgery:  Please shower the morning of surgery with the CHG soap Do not apply any deodorants/lotions. Please wear clean clothes to the hospital/surgery center.   Remember to brush your teeth WITH YOUR REGULAR TOOTHPASTE.   Please read over the following fact sheets that you were given.

## 2019-12-15 ENCOUNTER — Encounter (HOSPITAL_COMMUNITY): Payer: Self-pay

## 2019-12-15 ENCOUNTER — Other Ambulatory Visit (HOSPITAL_COMMUNITY)
Admission: RE | Admit: 2019-12-15 | Discharge: 2019-12-15 | Disposition: A | Discharge status: Home / Self Care | Payer: Medicare Other | Source: Ambulatory Visit | Attending: Neurosurgery | Admitting: Neurosurgery

## 2019-12-15 ENCOUNTER — Other Ambulatory Visit: Payer: Self-pay

## 2019-12-15 ENCOUNTER — Encounter (HOSPITAL_COMMUNITY)
Admission: RE | Admit: 2019-12-15 | Discharge: 2019-12-15 | Disposition: A | Discharge status: Home / Self Care | Payer: Medicare Other | Source: Ambulatory Visit | Attending: Neurosurgery | Admitting: Neurosurgery

## 2019-12-15 DIAGNOSIS — Z90710 Acquired absence of both cervix and uterus: Secondary | ICD-10-CM | POA: Diagnosis not present

## 2019-12-15 DIAGNOSIS — M1990 Unspecified osteoarthritis, unspecified site: Secondary | ICD-10-CM | POA: Insufficient documentation

## 2019-12-15 DIAGNOSIS — Z9842 Cataract extraction status, left eye: Secondary | ICD-10-CM | POA: Diagnosis not present

## 2019-12-15 DIAGNOSIS — Z79890 Hormone replacement therapy (postmenopausal): Secondary | ICD-10-CM | POA: Insufficient documentation

## 2019-12-15 DIAGNOSIS — E559 Vitamin D deficiency, unspecified: Secondary | ICD-10-CM | POA: Diagnosis not present

## 2019-12-15 DIAGNOSIS — Z791 Long term (current) use of non-steroidal anti-inflammatories (NSAID): Secondary | ICD-10-CM | POA: Diagnosis not present

## 2019-12-15 DIAGNOSIS — Z950 Presence of cardiac pacemaker: Secondary | ICD-10-CM | POA: Insufficient documentation

## 2019-12-15 DIAGNOSIS — Z87891 Personal history of nicotine dependence: Secondary | ICD-10-CM | POA: Diagnosis not present

## 2019-12-15 DIAGNOSIS — Z85820 Personal history of malignant melanoma of skin: Secondary | ICD-10-CM | POA: Diagnosis not present

## 2019-12-15 DIAGNOSIS — Z9841 Cataract extraction status, right eye: Secondary | ICD-10-CM | POA: Diagnosis not present

## 2019-12-15 DIAGNOSIS — M48061 Spinal stenosis, lumbar region without neurogenic claudication: Secondary | ICD-10-CM | POA: Diagnosis not present

## 2019-12-15 DIAGNOSIS — Z20822 Contact with and (suspected) exposure to covid-19: Secondary | ICD-10-CM | POA: Diagnosis not present

## 2019-12-15 DIAGNOSIS — I10 Essential (primary) hypertension: Secondary | ICD-10-CM | POA: Diagnosis not present

## 2019-12-15 DIAGNOSIS — Z7901 Long term (current) use of anticoagulants: Secondary | ICD-10-CM | POA: Diagnosis not present

## 2019-12-15 DIAGNOSIS — I083 Combined rheumatic disorders of mitral, aortic and tricuspid valves: Secondary | ICD-10-CM | POA: Insufficient documentation

## 2019-12-15 DIAGNOSIS — Z79899 Other long term (current) drug therapy: Secondary | ICD-10-CM | POA: Diagnosis not present

## 2019-12-15 DIAGNOSIS — Z01812 Encounter for preprocedural laboratory examination: Secondary | ICD-10-CM | POA: Diagnosis not present

## 2019-12-15 DIAGNOSIS — E039 Hypothyroidism, unspecified: Secondary | ICD-10-CM | POA: Insufficient documentation

## 2019-12-15 HISTORY — DX: Other complications of anesthesia, initial encounter: T88.59XA

## 2019-12-15 HISTORY — DX: Nausea with vomiting, unspecified: R11.2

## 2019-12-15 HISTORY — DX: Other specified postprocedural states: Z98.890

## 2019-12-15 LAB — BASIC METABOLIC PANEL
Anion gap: 6 (ref 5–15)
BUN: 23 mg/dL (ref 8–23)
CO2: 30 mmol/L (ref 22–32)
Calcium: 9.6 mg/dL (ref 8.9–10.3)
Chloride: 105 mmol/L (ref 98–111)
Creatinine, Ser: 1 mg/dL (ref 0.44–1.00)
GFR calc Af Amer: >60 mL/min (ref >60)
GFR calc non Af Amer: 54 mL/min — ABNORMAL LOW (ref >60)
Glucose, Bld: 118 mg/dL — ABNORMAL HIGH (ref 70–99)
Potassium: 4.1 mmol/L (ref 3.5–5.1)
Sodium: 141 mmol/L (ref 135–145)

## 2019-12-15 LAB — TYPE AND SCREEN
ABO/RH(D): O POS
Antibody Screen: NEGATIVE

## 2019-12-15 LAB — CBC
HCT: 47.8 % — ABNORMAL HIGH (ref 36.0–46.0)
Hemoglobin: 15 g/dL (ref 12.0–15.0)
MCH: 30.1 pg (ref 26.0–34.0)
MCHC: 31.4 g/dL (ref 30.0–36.0)
MCV: 95.8 fL (ref 80.0–100.0)
Platelets: 214 10*3/uL (ref 150–400)
RBC: 4.99 MIL/uL (ref 3.87–5.11)
RDW: 12.3 % (ref 11.5–15.5)
WBC: 6.2 10*3/uL (ref 4.0–10.5)
nRBC: 0 % (ref 0.0–0.2)

## 2019-12-15 LAB — SARS CORONAVIRUS 2 (TAT 6-24 HRS): SARS Coronavirus 2: NEGATIVE

## 2019-12-15 LAB — SURGICAL PCR SCREEN
MRSA, PCR: NEGATIVE
Staphylococcus aureus: NEGATIVE

## 2019-12-15 LAB — ABO/RH: ABO/RH(D): O POS

## 2019-12-15 NOTE — Progress Notes (Signed)
TC from patient regarding visitation policy and the need to swap out visitor during her stay.  Patient shared that daughter in law would be present for day of surgery and that private cna would be the point person for the remainder of her stay. Patient advised policy allows 1 designated visitor and  Hydrographic surveyor would be the contact person to approve a change out.

## 2019-12-15 NOTE — Progress Notes (Signed)
PCP:  Dr. Roma Schanz Cardiologist:  Dr. Aundra Dubin and dr. Lovena Le  EKG: 11/23/19 CXR:  08/17/19 ECHO:  08/17/19 Stress Test:  denies Cardiac Cath:  denies  Covid test 120/21  Anesthesia Review:  Yes, PPM.  Form sent to device clinic.   Patient denies shortness of breath, fever, cough, and chest pain at PAT appointment.  Patient verbalized understanding of instructions provided today at the PAT appointment.  Patient asked to review instructions at home and day of surgery.

## 2019-12-18 NOTE — Anesthesia Preprocedure Evaluation (Addendum)
Anesthesia Evaluation  Patient identified by MRN, date of birth, ID band Patient awake    Reviewed: Allergy & Precautions, NPO status , Patient's Chart, lab work & pertinent test results, reviewed documented beta blocker date and time   History of Anesthesia Complications (+) PONV and history of anesthetic complications  Airway Mallampati: II  TM Distance: >3 FB Neck ROM: Full    Dental  (+) Dental Advisory Given,    Pulmonary neg pulmonary ROS, former smoker,    Pulmonary exam normal breath sounds clear to auscultation       Cardiovascular hypertension, Pt. on medications and Pt. on home beta blockers Normal cardiovascular exam+ dysrhythmias + pacemaker  Rhythm:Regular Rate:Normal  Echo 09/05/2019 1. Left ventricular ejection fraction, by visual estimation, is 55 to 60%. The left ventricle has normal function. Normal left ventricular size. Left ventricular septal wall thickness was mildly increased. There is mildly increased left ventricular hypertrophy.  2. Global right ventricle has normal systolic function. The right ventricular size is normal. No increase in right ventricular wall thickness.  3. Left atrial size was normal.  4. Right atrial size was normal.  5. The mitral valve is normal in structure. Mild mitral valve  regurgitation.  6. The tricuspid valve is normal in structure. Tricuspid valve regurgitation is mild.  7. The aortic valve is tricuspid Aortic valve regurgitation is mild by color flow Doppler. Mild to moderate aortic valve sclerosis/calcification without any evidence of aortic stenosis.  8. The pulmonic valve was grossly normal. Pulmonic valve regurgitation is mild by color flow Doppler.  9. Mildly elevated pulmonary artery systolic pressure.    Neuro/Psych PSYCHIATRIC DISORDERS Anxiety  Neuromuscular disease    GI/Hepatic negative GI ROS, Neg liver ROS,   Endo/Other  Hypothyroidism    Renal/GU negative Renal ROS     Musculoskeletal  (+) Arthritis ,   Abdominal   Peds  Hematology negative hematology ROS (+)   Anesthesia Other Findings   Reproductive/Obstetrics negative OB ROS                           Anesthesia Physical Anesthesia Plan  ASA: III  Anesthesia Plan: General   Post-op Pain Management:    Induction: Intravenous  PONV Risk Score and Plan:   Airway Management Planned: Oral ETT  Additional Equipment: None  Intra-op Plan:   Post-operative Plan: Extubation in OR  Informed Consent: I have reviewed the patients History and Physical, chart, labs and discussed the procedure including the risks, benefits and alternatives for the proposed anesthesia with the patient or authorized representative who has indicated his/her understanding and acceptance.     Dental advisory given  Plan Discussed with: CRNA  Anesthesia Plan Comments: (PAT note written 12/18/2019 by Myra Gianotti, PA-C. Has St. Jude PPM. )      Anesthesia Quick Evaluation

## 2019-12-18 NOTE — Progress Notes (Addendum)
Anesthesia Chart Review:  Case: [id number] Date/Time: 12/19/19 0715   Procedures:      Left Lumbar 2-3 Lumbar 3-4 Lumbar 4-5 Anterolateral lumbar interbody fusion with percutaneous pedicle screws (Left ) - Left Lumbar 2-3 Lumbar 3-4 Lumbar 4-5 Anterolateral lumbar interbody fusion with percutaneous pedicle screws     LUMBAR PERCUTANEOUS PEDICLE SCREW 3 LEVEL (N/A )   Anesthesia type: General   Pre-op diagnosis: Lumbar foraminal stenosis   Location: [facility] OR ROOM 21 / [place] OR   Surgeons: Erline Levine, MD      DISCUSSION: Patient is a 79 year old female scheduled for the above procedure.  History includes former smoker (quit 03/15/81), post-operative N/V, HTN, dysrhythmia (remote tachycardia, Mobitz type II AVB; s/p St. Jude dual chamber PPM 08/16/19; PAF 11/15/19 PPM interrogation), melanoma (s/p excision from [place]; excision of right foot melanoma in situ 06/24/09), transitional cell carcinoma of the bladder (diagnosed ~1999; getting yearly surveillance cystourethroscopy), hypothyroidism, hearing loss, MRSA, dental implants, back surgery (s/p right L4-5 decompressive hemilaminectomy 08/02/18).   Patient had EP follow-up with Dr. Lovena Le on 11/23/19.  On PPM transmission she had up to an hour of PAF. Dr. Lovena Le recommended she start Eliquis, but not until after upcoming back surgery. He felt she was "acceptable risk" for back surgery from a cardiac standpoint.   Preoperative COVID-19 test from 12/15/19 was negative. Anesthesia team to evaluate on the day of surgery. Per St. Jude PPM perioperative device Rx from, she is PPM dependent.    VS: BP (!) 152/88   Pulse 72   Temp 36.5 C (Oral)   Resp 19   Ht 5\' 2"  (1.575 m)   Wt 78.4 kg   SpO2 98%   BMI 31.61 kg/m    PROVIDERS: Ann Held, DO Cristopher Peru, MD is EP cardiologist Myrlene Broker, MD is urologist [facility] Last visit 11/06/19.   LABS: Labs reviewed: Acceptable for surgery. (all labs ordered are listed,  but only abnormal results are displayed)  Labs Reviewed  CBC - Abnormal; Notable for the following components:      Result Value   HCT 47.8 (*)    All other components within normal limits  BASIC METABOLIC PANEL - Abnormal; Notable for the following components:   Glucose, Bld 118 (*)    GFR calc non Af Amer 54 (*)    All other components within normal limits  SURGICAL PCR SCREEN  TYPE AND SCREEN  ABO/RH     IMAGES: CXR 08/17/19: IMPRESSION: - New dual lead transvenous pacemaker in appropriate position. No pneumothorax visualized. - Stable mild cardiomegaly, bibasilar scarring, and small hiatal hernia.   EKG: 11/23/19 ([facility] EP). No interpretation written on tracing. Appears to be sinus rhythm at 66 bpm. Cannot rule out anterior infarct (age undetermined). Possible LBBB pattern.    CV: Echo 08/17/19: IMPRESSIONS  1. Left ventricular ejection fraction, by visual estimation, is 55 to  60%. The left ventricle has normal function. Normal left ventricular size.  Left ventricular septal wall thickness was mildly increased. There is  mildly increased left ventricular  hypertrophy.  2. Global right ventricle has normal systolic function.The right  ventricular size is normal. No increase in right ventricular wall  thickness.  3. Left atrial size was normal.  4. Right atrial size was normal.  5. The mitral valve is normal in structure. Mild mitral valve  regurgitation.  6. The tricuspid valve is normal in structure. Tricuspid valve  regurgitation is mild.  7.  The aortic valve is tricuspid Aortic valve regurgitation is mild by  color flow Doppler. Mild to moderate aortic valve sclerosis/calcification  without any evidence of aortic stenosis.  8. The pulmonic valve was grossly normal. Pulmonic valve regurgitation is  mild by color flow Doppler.  9. Mildly elevated pulmonary artery systolic pressure. RVSP 31.6 mmHg.   Past Medical History:  Diagnosis Date  .  Arthritis   . Complication of anesthesia   . HEARING LOSS, BILATERAL 03/02/2008   Qualifier: Diagnosis of  By: Jerold Coombe    . Hx of cyst of breast 07/21/2012  . Hypertension   . Hypothyroidism 03/02/2008   Qualifier: Diagnosis of  By: Jerold Coombe    . Melanoma ([place])   . Mobitz type II atrioventricular block 08/15/2019  . MRSA (methicillin resistant staph aureus) culture positive   . Osteopenia   . PONV (postoperative nausea and vomiting)   . Rapid heart beat    benigh  . Transitional cell carcinoma ([place])   . Vitamin D deficiency 09/12/2019    Past Surgical History:  Procedure Laterality Date  . ABDOMINAL HYSTERECTOMY  1987   partial  . BACK SURGERY     08/02/2018  . CATARACT EXTRACTION  2004  . EYE SURGERY     cataract b/l  . MELANOMA EXCISION  1977   left leg  . MELANOMA EXCISION  06-24-09   r foot  . PACEMAKER IMPLANT N/A 08/16/2019   Procedure: PACEMAKER IMPLANT;  Surgeon: Evans Lance, MD;  Location: [place] [facility];  Service: Cardiovascular;  Laterality: N/A;  . SKIN CANCER EXCISION  1999   bladder transitional cell  . teeth implants    . TONSILLECTOMY      MEDICATIONS: . acetaminophen (TYLENOL ARTHRITIS PAIN) 650 MG CR tablet  . amLODipine (NORVASC) 5 MG tablet  . apixaban (ELIQUIS) 5 MG TABS tablet  . ARTIFICIAL TEAR SOLUTION OP  . betamethasone dipropionate (DIPROLENE) 0.05 % cream  . Cholecalciferol (VITAMIN D-1000 MAX ST) 25 MCG (1000 UT) tablet  . docusate sodium (COLACE) 100 MG capsule  . estradiol (ESTRACE) 0.5 MG tablet  . guaifenesin (HUMIBID E) 400 MG TABS tablet  . Homeopathic Products [facility] SINUS RELIEF NA)  . Lactobacillus (AZO COMPLETE FEMININE BALANCE) CAPS  . medroxyPROGESTERone (PROVERA) 5 MG tablet  . meloxicam (MOBIC) 15 MG tablet  . Menthol-Methyl Salicylate (SALONPAS PAIN RELIEF PATCH) PTCH  . Misc Natural Products (GLUCOSAMINE CHOND COMPLEX/MSM) TABS  . propranolol (INDERAL) 10 MG tablet  . sodium chloride (OCEAN)  0.65 % SOLN nasal spray  . spironolactone (ALDACTONE) 25 MG tablet  . SYNTHROID 75 MCG tablet   No current facility-administered medications for this encounter.    Myra Gianotti, PA-C Surgical Short Stay/Anesthesiology [facility] Phone [phone] [facility] Phone [phone] 12/18/2019 10:31 AM

## 2019-12-19 ENCOUNTER — Inpatient Hospital Stay (HOSPITAL_COMMUNITY): Payer: Medicare Other | Admitting: Anesthesiology

## 2019-12-19 ENCOUNTER — Observation Stay (HOSPITAL_COMMUNITY)
Admission: RE | Admit: 2019-12-19 | Discharge: 2019-12-20 | Disposition: A | Discharge status: Home / Self Care | Payer: Medicare Other | Attending: Neurosurgery | Admitting: Neurosurgery

## 2019-12-19 ENCOUNTER — Inpatient Hospital Stay (HOSPITAL_COMMUNITY): Payer: Medicare Other

## 2019-12-19 ENCOUNTER — Inpatient Hospital Stay (HOSPITAL_COMMUNITY): Payer: Medicare Other | Admitting: Vascular Surgery

## 2019-12-19 ENCOUNTER — Encounter (HOSPITAL_COMMUNITY)
Admission: RE | Disposition: A | Discharge status: Home / Self Care | Payer: Self-pay | Source: Home / Self Care | Attending: Neurosurgery

## 2019-12-19 ENCOUNTER — Encounter (HOSPITAL_COMMUNITY): Payer: Self-pay | Admitting: Neurosurgery

## 2019-12-19 DIAGNOSIS — Z950 Presence of cardiac pacemaker: Secondary | ICD-10-CM | POA: Insufficient documentation

## 2019-12-19 DIAGNOSIS — M4316 Spondylolisthesis, lumbar region: Secondary | ICD-10-CM | POA: Diagnosis not present

## 2019-12-19 DIAGNOSIS — Z419 Encounter for procedure for purposes other than remedying health state, unspecified: Secondary | ICD-10-CM

## 2019-12-19 DIAGNOSIS — Z791 Long term (current) use of non-steroidal anti-inflammatories (NSAID): Secondary | ICD-10-CM | POA: Diagnosis not present

## 2019-12-19 DIAGNOSIS — M4310 Spondylolisthesis, site unspecified: Secondary | ICD-10-CM | POA: Insufficient documentation

## 2019-12-19 DIAGNOSIS — E782 Mixed hyperlipidemia: Secondary | ICD-10-CM | POA: Diagnosis not present

## 2019-12-19 DIAGNOSIS — M1990 Unspecified osteoarthritis, unspecified site: Secondary | ICD-10-CM | POA: Diagnosis not present

## 2019-12-19 DIAGNOSIS — G709 Myoneural disorder, unspecified: Secondary | ICD-10-CM | POA: Insufficient documentation

## 2019-12-19 DIAGNOSIS — Z7901 Long term (current) use of anticoagulants: Secondary | ICD-10-CM | POA: Insufficient documentation

## 2019-12-19 DIAGNOSIS — M8580 Other specified disorders of bone density and structure, unspecified site: Secondary | ICD-10-CM | POA: Diagnosis not present

## 2019-12-19 DIAGNOSIS — F419 Anxiety disorder, unspecified: Secondary | ICD-10-CM | POA: Diagnosis not present

## 2019-12-19 DIAGNOSIS — M4186 Other forms of scoliosis, lumbar region: Secondary | ICD-10-CM | POA: Diagnosis not present

## 2019-12-19 DIAGNOSIS — Z6831 Body mass index (BMI) 31.0-31.9, adult: Secondary | ICD-10-CM | POA: Insufficient documentation

## 2019-12-19 DIAGNOSIS — E559 Vitamin D deficiency, unspecified: Secondary | ICD-10-CM | POA: Diagnosis not present

## 2019-12-19 DIAGNOSIS — Z885 Allergy status to narcotic agent status: Secondary | ICD-10-CM | POA: Insufficient documentation

## 2019-12-19 DIAGNOSIS — I088 Other rheumatic multiple valve diseases: Secondary | ICD-10-CM | POA: Insufficient documentation

## 2019-12-19 DIAGNOSIS — M545 Low back pain: Secondary | ICD-10-CM | POA: Diagnosis not present

## 2019-12-19 DIAGNOSIS — Z87891 Personal history of nicotine dependence: Secondary | ICD-10-CM | POA: Diagnosis not present

## 2019-12-19 DIAGNOSIS — M419 Scoliosis, unspecified: Secondary | ICD-10-CM | POA: Diagnosis present

## 2019-12-19 DIAGNOSIS — E039 Hypothyroidism, unspecified: Secondary | ICD-10-CM | POA: Diagnosis not present

## 2019-12-19 DIAGNOSIS — E669 Obesity, unspecified: Secondary | ICD-10-CM | POA: Insufficient documentation

## 2019-12-19 DIAGNOSIS — M48061 Spinal stenosis, lumbar region without neurogenic claudication: Principal | ICD-10-CM | POA: Insufficient documentation

## 2019-12-19 DIAGNOSIS — M4326 Fusion of spine, lumbar region: Secondary | ICD-10-CM | POA: Diagnosis not present

## 2019-12-19 DIAGNOSIS — I10 Essential (primary) hypertension: Secondary | ICD-10-CM | POA: Diagnosis not present

## 2019-12-19 DIAGNOSIS — Z79899 Other long term (current) drug therapy: Secondary | ICD-10-CM | POA: Insufficient documentation

## 2019-12-19 DIAGNOSIS — Z981 Arthrodesis status: Secondary | ICD-10-CM | POA: Diagnosis not present

## 2019-12-19 DIAGNOSIS — M5416 Radiculopathy, lumbar region: Secondary | ICD-10-CM | POA: Diagnosis not present

## 2019-12-19 DIAGNOSIS — M9983 Other biomechanical lesions of lumbar region: Secondary | ICD-10-CM | POA: Diagnosis not present

## 2019-12-19 HISTORY — PX: ANTERIOR LAT LUMBAR FUSION: SHX1168

## 2019-12-19 HISTORY — PX: LUMBAR PERCUTANEOUS PEDICLE SCREW 3 LEVEL: SHX5562

## 2019-12-19 SURGERY — ANTERIOR LATERAL LUMBAR FUSION 3 LEVELS
Anesthesia: General | Site: Spine Lumbar

## 2019-12-19 MED ORDER — LIDOCAINE-EPINEPHRINE 1 %-1:100000 IJ SOLN
INTRAMUSCULAR | Status: DC | PRN
  Administered 2019-12-19: 10 mL
  Administered 2019-12-19: 11 mL

## 2019-12-19 MED ORDER — FENTANYL CITRATE (PF) 100 MCG/2ML IJ SOLN
25.0000 ug | INTRAMUSCULAR | Status: DC | PRN
  Administered 2019-12-19 (×4): 25 ug via INTRAVENOUS

## 2019-12-19 MED ORDER — SODIUM CHLORIDE 0.9% FLUSH
3.0000 mL | Freq: Two times a day (BID) | INTRAVENOUS | Status: DC
  Administered 2019-12-19: 3 mL via INTRAVENOUS

## 2019-12-19 MED ORDER — SODIUM CHLORIDE 0.9% FLUSH: 3.0000 mL | INTRAVENOUS | Status: DC | PRN

## 2019-12-19 MED ORDER — CEFAZOLIN SODIUM-DEXTROSE 2-4 GM/100ML-% IV SOLN
2.0000 g | Freq: Three times a day (TID) | INTRAVENOUS | Status: AC
  Administered 2019-12-19 (×2): 2 g via INTRAVENOUS
  Filled 2019-12-19 (×2): qty 100

## 2019-12-19 MED ORDER — OXYCODONE HCL 5 MG PO TABS: 5.0000 mg | ORAL_TABLET | ORAL | Status: DC | PRN

## 2019-12-19 MED ORDER — ONDANSETRON HCL 4 MG/2ML IJ SOLN: 4.0000 mg | Freq: Four times a day (QID) | INTRAMUSCULAR | Status: DC | PRN

## 2019-12-19 MED ORDER — FENTANYL CITRATE (PF) 100 MCG/2ML IJ SOLN
INTRAMUSCULAR | Status: DC | PRN
  Administered 2019-12-19: 50 ug via INTRAVENOUS
  Administered 2019-12-19: 100 ug via INTRAVENOUS
  Administered 2019-12-19 (×2): 50 ug via INTRAVENOUS

## 2019-12-19 MED ORDER — VITAMIN D3 25 MCG (1000 UNIT) PO TABS
1000.0000 [IU] | ORAL_TABLET | Freq: Every day | ORAL | Status: DC
  Administered 2019-12-19 – 2019-12-20 (×2): 1000 [IU] via ORAL
  Filled 2019-12-19 (×3): qty 1

## 2019-12-19 MED ORDER — CHLORHEXIDINE GLUCONATE CLOTH 2 % EX PADS: 6.0000 | MEDICATED_PAD | Freq: Once | CUTANEOUS | Status: DC

## 2019-12-19 MED ORDER — MEPERIDINE HCL 25 MG/ML IJ SOLN: 6.2500 mg | INTRAMUSCULAR | Status: DC | PRN

## 2019-12-19 MED ORDER — THROMBIN 5000 UNITS EX SOLR
OROMUCOSAL | Status: DC | PRN
  Administered 2019-12-19: 5 mL via TOPICAL

## 2019-12-19 MED ORDER — ACETAMINOPHEN 325 MG PO TABS
650.0000 mg | ORAL_TABLET | Freq: Two times a day (BID) | ORAL | Status: DC
  Administered 2019-12-19 – 2019-12-20 (×2): 650 mg via ORAL
  Filled 2019-12-19 (×2): qty 2

## 2019-12-19 MED ORDER — PROPOFOL 500 MG/50ML IV EMUL
INTRAVENOUS | Status: DC | PRN
  Administered 2019-12-19: 40 ug/kg/min via INTRAVENOUS
  Administered 2019-12-19: 50 ug/kg/min via INTRAVENOUS

## 2019-12-19 MED ORDER — GUAIFENESIN 200 MG PO TABS
400.0000 mg | ORAL_TABLET | Freq: Every day | ORAL | Status: DC
  Administered 2019-12-19: 21:00:00 400 mg via ORAL
  Filled 2019-12-19 (×2): qty 2

## 2019-12-19 MED ORDER — ESTRADIOL 0.5 MG PO TABS: 0.2500 mg | ORAL_TABLET | ORAL | Status: DC

## 2019-12-19 MED ORDER — KCL IN DEXTROSE-NACL 20-5-0.45 MEQ/L-%-% IV SOLN: INTRAVENOUS | Status: DC

## 2019-12-19 MED ORDER — SIMILASAN SINUS RELIEF NA LIQD: Freq: Every day | NASAL | Status: DC | PRN

## 2019-12-19 MED ORDER — 0.9 % SODIUM CHLORIDE (POUR BTL) OPTIME
TOPICAL | Status: DC | PRN
  Administered 2019-12-19: 1000 mL

## 2019-12-19 MED ORDER — ACETAMINOPHEN 650 MG RE SUPP: 650.0000 mg | RECTAL | Status: DC | PRN

## 2019-12-19 MED ORDER — FLEET ENEMA 7-19 GM/118ML RE ENEM: 1.0000 | ENEMA | Freq: Once | RECTAL | Status: DC | PRN

## 2019-12-19 MED ORDER — CEFAZOLIN SODIUM-DEXTROSE 2-4 GM/100ML-% IV SOLN
INTRAVENOUS | Status: AC
  Filled 2019-12-19: qty 100

## 2019-12-19 MED ORDER — SALINE SPRAY 0.65 % NA SOLN
1.0000 | Freq: Every evening | NASAL | Status: DC | PRN
  Filled 2019-12-19: qty 44

## 2019-12-19 MED ORDER — SPIRONOLACTONE 12.5 MG HALF TABLET
12.5000 mg | ORAL_TABLET | Freq: Every day | ORAL | Status: DC
  Administered 2019-12-19 – 2019-12-20 (×2): 12.5 mg via ORAL
  Filled 2019-12-19 (×2): qty 1

## 2019-12-19 MED ORDER — LEVOTHYROXINE SODIUM 75 MCG PO TABS
75.0000 ug | ORAL_TABLET | Freq: Every day | ORAL | Status: DC
  Administered 2019-12-20: 06:00:00 75 ug via ORAL
  Filled 2019-12-19: qty 1

## 2019-12-19 MED ORDER — POLYETHYLENE GLYCOL 3350 17 G PO PACK: 17.0000 g | PACK | Freq: Every day | ORAL | Status: DC | PRN

## 2019-12-19 MED ORDER — FENTANYL CITRATE (PF) 250 MCG/5ML IJ SOLN
INTRAMUSCULAR | Status: AC
  Filled 2019-12-19: qty 5

## 2019-12-19 MED ORDER — PANTOPRAZOLE SODIUM 40 MG PO TBEC
40.0000 mg | DELAYED_RELEASE_TABLET | Freq: Every day | ORAL | Status: DC
  Administered 2019-12-19: 21:00:00 40 mg via ORAL
  Filled 2019-12-19: qty 1

## 2019-12-19 MED ORDER — DEXAMETHASONE SODIUM PHOSPHATE 10 MG/ML IJ SOLN
INTRAMUSCULAR | Status: AC
  Filled 2019-12-19: qty 1

## 2019-12-19 MED ORDER — ACETAMINOPHEN 10 MG/ML IV SOLN
INTRAVENOUS | Status: DC | PRN
  Administered 2019-12-19: 1000 mg via INTRAVENOUS

## 2019-12-19 MED ORDER — ONDANSETRON HCL 4 MG/2ML IJ SOLN
INTRAMUSCULAR | Status: AC
  Filled 2019-12-19: qty 2

## 2019-12-19 MED ORDER — ACETAMINOPHEN 325 MG PO TABS
650.0000 mg | ORAL_TABLET | ORAL | Status: DC | PRN
  Administered 2019-12-20: 04:00:00 650 mg via ORAL
  Filled 2019-12-19: qty 2

## 2019-12-19 MED ORDER — AMLODIPINE BESYLATE 5 MG PO TABS
5.0000 mg | ORAL_TABLET | Freq: Every day | ORAL | Status: DC
  Administered 2019-12-20: 5 mg via ORAL
  Filled 2019-12-19: qty 1

## 2019-12-19 MED ORDER — LACTATED RINGERS IV SOLN: INTRAVENOUS | Status: DC | PRN

## 2019-12-19 MED ORDER — FENTANYL CITRATE (PF) 100 MCG/2ML IJ SOLN
INTRAMUSCULAR | Status: AC
  Filled 2019-12-19: qty 2

## 2019-12-19 MED ORDER — SALONPAS PAIN RELIEF PATCH EX PTCH: 1.0000 | MEDICATED_PATCH | Freq: Every day | CUTANEOUS | Status: DC

## 2019-12-19 MED ORDER — DOCUSATE SODIUM 100 MG PO CAPS
100.0000 mg | ORAL_CAPSULE | Freq: Two times a day (BID) | ORAL | Status: DC
  Administered 2019-12-19 – 2019-12-20 (×3): 100 mg via ORAL
  Filled 2019-12-19 (×3): qty 1

## 2019-12-19 MED ORDER — AZO COMPLETE FEMININE BALANCE PO CAPS: 1.0000 | ORAL_CAPSULE | Freq: Every day | ORAL | Status: DC

## 2019-12-19 MED ORDER — LIDOCAINE-EPINEPHRINE 1 %-1:100000 IJ SOLN
INTRAMUSCULAR | Status: AC
  Filled 2019-12-19: qty 1

## 2019-12-19 MED ORDER — BUPIVACAINE HCL (PF) 0.5 % IJ SOLN
INTRAMUSCULAR | Status: DC | PRN
  Administered 2019-12-19: 10 mL
  Administered 2019-12-19: 11 mL

## 2019-12-19 MED ORDER — PHENYLEPHRINE 40 MCG/ML (10ML) SYRINGE FOR IV PUSH (FOR BLOOD PRESSURE SUPPORT)
PREFILLED_SYRINGE | INTRAVENOUS | Status: DC | PRN
  Administered 2019-12-19: 80 ug via INTRAVENOUS
  Administered 2019-12-19 (×2): 120 ug via INTRAVENOUS

## 2019-12-19 MED ORDER — HYDROMORPHONE HCL 1 MG/ML IJ SOLN
1.0000 mg | INTRAMUSCULAR | Status: DC | PRN
  Administered 2019-12-19: 15:00:00 1 mg via INTRAVENOUS
  Filled 2019-12-19: qty 1

## 2019-12-19 MED ORDER — PROPOFOL 10 MG/ML IV BOLUS
INTRAVENOUS | Status: DC | PRN
  Administered 2019-12-19: 140 mg via INTRAVENOUS

## 2019-12-19 MED ORDER — THROMBIN 5000 UNITS EX SOLR
CUTANEOUS | Status: AC
  Filled 2019-12-19: qty 5000

## 2019-12-19 MED ORDER — LIDOCAINE 2% (20 MG/ML) 5 ML SYRINGE
INTRAMUSCULAR | Status: DC | PRN
  Administered 2019-12-19: 100 mg via INTRAVENOUS

## 2019-12-19 MED ORDER — PANTOPRAZOLE SODIUM 40 MG IV SOLR: 40.0000 mg | Freq: Every day | INTRAVENOUS | Status: DC

## 2019-12-19 MED ORDER — BISACODYL 10 MG RE SUPP: 10.0000 mg | Freq: Every day | RECTAL | Status: DC | PRN

## 2019-12-19 MED ORDER — METHOCARBAMOL 500 MG PO TABS: 500.0000 mg | ORAL_TABLET | Freq: Four times a day (QID) | ORAL | Status: DC | PRN

## 2019-12-19 MED ORDER — SUCCINYLCHOLINE CHLORIDE 20 MG/ML IJ SOLN
INTRAMUSCULAR | Status: DC | PRN
  Administered 2019-12-19: 120 mg via INTRAVENOUS

## 2019-12-19 MED ORDER — BUPIVACAINE HCL (PF) 0.5 % IJ SOLN
INTRAMUSCULAR | Status: AC
  Filled 2019-12-19: qty 30

## 2019-12-19 MED ORDER — ZOLPIDEM TARTRATE 5 MG PO TABS: 5.0000 mg | ORAL_TABLET | Freq: Every evening | ORAL | Status: DC | PRN

## 2019-12-19 MED ORDER — SODIUM CHLORIDE 0.9 % IV SOLN: 250.0000 mL | INTRAVENOUS | Status: DC

## 2019-12-19 MED ORDER — MEDROXYPROGESTERONE ACETATE 2.5 MG PO TABS: 2.5000 mg | ORAL_TABLET | ORAL | Status: DC

## 2019-12-19 MED ORDER — CEFAZOLIN SODIUM-DEXTROSE 2-4 GM/100ML-% IV SOLN
2.0000 g | INTRAVENOUS | Status: AC
  Administered 2019-12-19: 2 g via INTRAVENOUS

## 2019-12-19 MED ORDER — PROPRANOLOL HCL 10 MG PO TABS
10.0000 mg | ORAL_TABLET | Freq: Two times a day (BID) | ORAL | Status: DC
  Administered 2019-12-19 – 2019-12-20 (×2): 10 mg via ORAL
  Filled 2019-12-19 (×3): qty 1

## 2019-12-19 MED ORDER — MENTHOL 3 MG MT LOZG: 1.0000 | LOZENGE | OROMUCOSAL | Status: DC | PRN

## 2019-12-19 MED ORDER — LIDOCAINE 2% (20 MG/ML) 5 ML SYRINGE
INTRAMUSCULAR | Status: AC
  Filled 2019-12-19: qty 5

## 2019-12-19 MED ORDER — HYDROCODONE-ACETAMINOPHEN 5-325 MG PO TABS
2.0000 | ORAL_TABLET | ORAL | Status: DC | PRN
  Administered 2019-12-19 – 2019-12-20 (×2): 2 via ORAL
  Filled 2019-12-19 (×2): qty 2

## 2019-12-19 MED ORDER — PHENYLEPHRINE HCL-NACL 10-0.9 MG/250ML-% IV SOLN
INTRAVENOUS | Status: DC | PRN
  Administered 2019-12-19 (×2): 50 ug/min via INTRAVENOUS
  Administered 2019-12-19: 75 ug/min via INTRAVENOUS

## 2019-12-19 MED ORDER — ONDANSETRON HCL 4 MG PO TABS: 4.0000 mg | ORAL_TABLET | Freq: Four times a day (QID) | ORAL | Status: DC | PRN

## 2019-12-19 MED ORDER — MUSCLE RUB 10-15 % EX CREA
TOPICAL_CREAM | Freq: Every day | CUTANEOUS | Status: DC
  Filled 2019-12-19: qty 85

## 2019-12-19 MED ORDER — SUCCINYLCHOLINE CHLORIDE 200 MG/10ML IV SOSY
PREFILLED_SYRINGE | INTRAVENOUS | Status: AC
  Filled 2019-12-19: qty 10

## 2019-12-19 MED ORDER — PHENYLEPHRINE 40 MCG/ML (10ML) SYRINGE FOR IV PUSH (FOR BLOOD PRESSURE SUPPORT)
PREFILLED_SYRINGE | INTRAVENOUS | Status: AC
  Filled 2019-12-19: qty 10

## 2019-12-19 MED ORDER — METHOCARBAMOL 1000 MG/10ML IJ SOLN
500.0000 mg | Freq: Four times a day (QID) | INTRAVENOUS | Status: DC | PRN
  Filled 2019-12-19: qty 5

## 2019-12-19 MED ORDER — ONDANSETRON HCL 4 MG/2ML IJ SOLN
INTRAMUSCULAR | Status: DC | PRN
  Administered 2019-12-19: 4 mg via INTRAVENOUS

## 2019-12-19 MED ORDER — PROPOFOL 10 MG/ML IV BOLUS
INTRAVENOUS | Status: AC
  Filled 2019-12-19: qty 20

## 2019-12-19 MED ORDER — ACETAMINOPHEN 10 MG/ML IV SOLN
INTRAVENOUS | Status: AC
  Filled 2019-12-19: qty 100

## 2019-12-19 MED ORDER — GLUCOSAMINE CHOND COMPLEX/MSM PO TABS: 1.0000 | ORAL_TABLET | Freq: Two times a day (BID) | ORAL | Status: DC

## 2019-12-19 MED ORDER — DOCUSATE SODIUM 100 MG PO CAPS: 200.0000 mg | ORAL_CAPSULE | Freq: Every day | ORAL | Status: DC | PRN

## 2019-12-19 MED ORDER — DEXAMETHASONE SODIUM PHOSPHATE 10 MG/ML IJ SOLN
INTRAMUSCULAR | Status: DC | PRN
  Administered 2019-12-19: 10 mg via INTRAVENOUS

## 2019-12-19 MED ORDER — ALUM & MAG HYDROXIDE-SIMETH 200-200-20 MG/5ML PO SUSP: 30.0000 mL | Freq: Four times a day (QID) | ORAL | Status: DC | PRN

## 2019-12-19 MED ORDER — ONDANSETRON HCL 4 MG/2ML IJ SOLN
4.0000 mg | Freq: Once | INTRAMUSCULAR | Status: AC | PRN
  Administered 2019-12-19: 13:00:00 4 mg via INTRAVENOUS

## 2019-12-19 MED ORDER — PHENOL 1.4 % MT LIQD: 1.0000 | OROMUCOSAL | Status: DC | PRN

## 2019-12-19 SURGICAL SUPPLY — 70 items
ADH SKN CLS APL DERMABOND .7 (GAUZE/BANDAGES/DRESSINGS) ×4
CAGE MODULUS XLW 8X22X50 - 10 (Cage) ×1 IMPLANT
CLIP NEUROVISION LG (CLIP) ×1 IMPLANT
COVER BACK TABLE 60X90IN (DRAPES) ×3 IMPLANT
DERMABOND ADVANCED (GAUZE/BANDAGES/DRESSINGS) ×2
DERMABOND ADVANCED .7 DNX12 (GAUZE/BANDAGES/DRESSINGS) ×4 IMPLANT
DRAPE C-ARM 42X72 X-RAY (DRAPES) ×6 IMPLANT
DRAPE C-ARMOR (DRAPES) ×6 IMPLANT
DRAPE LAPAROTOMY 100X72X124 (DRAPES) ×6 IMPLANT
DRAPE SURG 17X23 STRL (DRAPES) ×3 IMPLANT
DRSG OPSITE POSTOP 3X4 (GAUZE/BANDAGES/DRESSINGS) ×2 IMPLANT
DRSG OPSITE POSTOP 4X6 (GAUZE/BANDAGES/DRESSINGS) ×1 IMPLANT
DRSG OPSITE POSTOP 4X8 (GAUZE/BANDAGES/DRESSINGS) ×2 IMPLANT
DURAPREP 26ML APPLICATOR (WOUND CARE) ×6 IMPLANT
ELECT REM PT RETURN 9FT ADLT (ELECTROSURGICAL) ×6
ELECTRODE REM PT RTRN 9FT ADLT (ELECTROSURGICAL) ×4 IMPLANT
GAUZE 4X4 16PLY RFD (DISPOSABLE) ×1 IMPLANT
GAUZE SPONGE 4X4 12PLY STRL (GAUZE/BANDAGES/DRESSINGS) ×2 IMPLANT
GLOVE BIO SURGEON STRL SZ 6.5 (GLOVE) ×3 IMPLANT
GLOVE BIO SURGEON STRL SZ7 (GLOVE) ×3 IMPLANT
GLOVE BIO SURGEON STRL SZ8 (GLOVE) ×9 IMPLANT
GLOVE BIOGEL PI IND STRL 7.5 (GLOVE) IMPLANT
GLOVE BIOGEL PI IND STRL 8 (GLOVE) ×4 IMPLANT
GLOVE BIOGEL PI IND STRL 8.5 (GLOVE) ×6 IMPLANT
GLOVE BIOGEL PI INDICATOR 7.5 (GLOVE) ×4
GLOVE BIOGEL PI INDICATOR 8 (GLOVE) ×2
GLOVE BIOGEL PI INDICATOR 8.5 (GLOVE) ×3
GLOVE ECLIPSE 8.0 STRL XLNG CF (GLOVE) ×6 IMPLANT
GLOVE SS N UNI LF 6.5 STRL (GLOVE) ×2 IMPLANT
GLOVE SS N UNI LF 7.0 STRL (GLOVE) ×2 IMPLANT
GOWN STRL REUS W/ TWL LRG LVL3 (GOWN DISPOSABLE) IMPLANT
GOWN STRL REUS W/ TWL XL LVL3 (GOWN DISPOSABLE) ×6 IMPLANT
GOWN STRL REUS W/TWL 2XL LVL3 (GOWN DISPOSABLE) ×2 IMPLANT
GOWN STRL REUS W/TWL LRG LVL3 (GOWN DISPOSABLE) ×9
GOWN STRL REUS W/TWL XL LVL3 (GOWN DISPOSABLE) ×9
GUIDEWIRE NITINOL BEVEL TIP (WIRE) ×8 IMPLANT
HEMOSTAT POWDER KIT SURGIFOAM (HEMOSTASIS) ×1 IMPLANT
KIT BASIN OR (CUSTOM PROCEDURE TRAY) ×6 IMPLANT
KIT DILATOR XLIF 5 (KITS) ×1 IMPLANT
KIT INFUSE SMALL (Orthopedic Implant) ×1 IMPLANT
KIT POSITION SURG JACKSON T1 (MISCELLANEOUS) ×3 IMPLANT
KIT SURGICAL ACCESS MAXCESS 4 (KITS) ×1 IMPLANT
KIT TURNOVER KIT B (KITS) ×5 IMPLANT
MARKER SKIN DUAL TIP RULER LAB (MISCELLANEOUS) ×4 IMPLANT
MODULE NVM5 NEXT GEN EMG (NEEDLE) ×1 IMPLANT
MODULUS XLW 10X22X50MM 10DEG (Spine Construct) ×1 IMPLANT
MODULUS XLW 12X22X55MM 10 (Spine Construct) ×1 IMPLANT
NDL HYPO 25X1 1.5 SAFETY (NEEDLE) ×4 IMPLANT
NDL I PASS (NEEDLE) IMPLANT
NEEDLE HYPO 25X1 1.5 SAFETY (NEEDLE) ×6 IMPLANT
NEEDLE I PASS (NEEDLE) ×6 IMPLANT
NS IRRIG 1000ML POUR BTL (IV SOLUTION) ×5 IMPLANT
PACK LAMINECTOMY NEURO (CUSTOM PROCEDURE TRAY) ×6 IMPLANT
PUTTY BONE ATTRAX 10CC STRIP (Putty) ×1 IMPLANT
PUTTY BONE ATTRAX 5CC STRIP (Putty) ×1 IMPLANT
ROD RELINE MAS LORD 5.5X100MM (Rod) ×1 IMPLANT
ROD RELINE MAS LORD 5.5X110MM (Rod) ×1 IMPLANT
SCREW LOCK RELINE 5.5 TULIP (Screw) ×8 IMPLANT
SCREW MAS RELINE 6.5X45 POLY (Screw) ×6 IMPLANT
SCREW RELINE MAS POLY 5.5X45MM (Screw) ×2 IMPLANT
SPONGE LAP 4X18 RFD (DISPOSABLE) IMPLANT
STAPLER SKIN PROX WIDE 3.9 (STAPLE) ×3 IMPLANT
SUT VIC AB 1 CT1 18XBRD ANBCTR (SUTURE) ×6 IMPLANT
SUT VIC AB 1 CT1 8-18 (SUTURE) ×9
SUT VIC AB 2-0 CT1 18 (SUTURE) ×10 IMPLANT
SUT VIC AB 3-0 SH 8-18 (SUTURE) ×10 IMPLANT
TOWEL GREEN STERILE (TOWEL DISPOSABLE) ×3 IMPLANT
TOWEL GREEN STERILE FF (TOWEL DISPOSABLE) ×3 IMPLANT
TRAY FOLEY MTR SLVR 16FR STAT (SET/KITS/TRAYS/PACK) ×5 IMPLANT
WATER STERILE IRR 1000ML POUR (IV SOLUTION) ×5 IMPLANT

## 2019-12-19 NOTE — Progress Notes (Signed)
Awake, alert, sleepy, but arouses.  Says "I feel sick."  MAEW with good power.  Doing well.

## 2019-12-19 NOTE — Brief Op Note (Signed)
12/19/2019  12:47 PM  PATIENT:  Wendy Velazquez  79 y.o. female  PRE-OPERATIVE DIAGNOSIS:  Lumbar foraminal stenosis, spondylolisthesis, scoliosis, lumbago, radiculopathy   POST-OPERATIVE DIAGNOSIS:   Lumbar foraminal stenosis, spondylolisthesis, scoliosis, lumbago, radiculopathy   PROCEDURE:  Procedure(s) with comments: Left Lumbar Two-Three, Lumbar Three-Four, Lumbar Four-Five Anterolateral lumbar Interbody Fusion (Left) - Left Lumbar 2-3 Lumbar 3-4 Lumbar 4-5 Anterolateral lumbar interbody fusion with percutaneous pedicle screws Lumbar Two-Three, Lumbar Three-Four, Lumbar Four-Five Percutaneous Pedicle Screw Placement (N/A)  SURGEON:  Surgeon(s) and Role:    Erline Levine, MD - Primary    * Judith Part, MD - Assisting  PHYSICIAN ASSISTANT:   ASSISTANTS: Poteat, RN   ANESTHESIA:   general  EBL:  100 mL   BLOOD ADMINISTERED:none  DRAINS: none   LOCAL MEDICATIONS USED:  MARCAINE    and LIDOCAINE   SPECIMEN:  No Specimen  DISPOSITION OF SPECIMEN:  N/A  COUNTS:  YES  TOURNIQUET:  * No tourniquets in log *  DICTATION: Patient is a 79 year old woman with severe spondylosis, stenosis, spondylolisthesis and scoliosis of the lumbar spine. It was elected to take her to surgery for anterolateral decompression and posterior pedicle screw fixation from L 23, L 34, L 45 levels.  Procedure: Patient was brought to the operating room and placed in a right lateral decubitus position on the operative table and using orthogonally projected C-arm fluoroscopy the patient was placed so that the L2-3 L3-4 and L 45 levels were visualized in AP and lateral plane. The patient was then taped into position. The table was flexed so as to expose the L 45 level as the patient has a high iliac crest. Skin was marked along with a posterior finger dissection incision. Her flank was then prepped and draped in usual sterile fashion and incisions were made sequentially at L 45,  L3-4 and L2-3 levels.  Posterior finger dissection was made to enter the retroperitoneal space and then subsequently the probe was inserted into the psoas muscle from the left side initially at the L 45 level. After mapping the neural elements were able to dock the probe per the midpoint of this vertebral level and without indications electrically of too close proximity to the neural tissues. Subsequently the self-retaining tractor was.after sequential dilators were utilized the shim was employed and the interspace was cleared of psoas muscle and then incised. A thorough discectomy was performed. Instruments were used to clear the interspace of disc material. An anterior entry with posterior trajectory was performed to avoid neural elements.   After thorough discectomy was performed and this was performed using AP and lateral fluoroscopy a 12 lordotic by 55 x 22 mm implant was packed with small BMP and Attrax. This was tamped into position and its position was confirmed on AP and lateral fluoroscopy. Subsequently exposure was performed at the L3-4 level and similar dissection was performed with locking of the self-retaining retractor. At this level were able to place a 10 lordotic x  22 x 92mm implant packed in a similar fashion. At the L2-3 level were able to place an 8 lordotic x 50 x 22 mm implant packed in a similar fashion. Hemostasis was assured the wounds were irrigated and closed with interrupted Vicryl sutures.  Sterile occlusive dressings were placed.    Patient was then turned into a prone position on the [place] table using AP and lateral fluoroscopy throughout this portion of the procedure, pedicle screws were placed using Reline Nuvasive cannulated percutaneous screws. 2  screws were placed at L2 and (5.5 x 45 mm) and 2 at L3 (6.5 x 45), and two at L4 and L 5 of a similar size. 100 mm rod was then affixed to the screw heads do a separate stab incision and locked down on the screws on the left and 110 mm rod on the right.  All connections were then torqued and the Towers were disassembled. The wounds were irrigated and then closed with 1, 2-0 and 3-0 Vicryl stitches. Sterile occlusive dressing was placed with Dermabond and occlusive dressings. The patient was then extubated in the operating room and taken to recovery in stable and satisfactory condition having tolerated her operation well. Counts were correct at the end of the case.  Pelvic Parameters:  Preop: PT 25; PI 42; LL25; PI-LL -19; SVA 85 mm.  Coronal curve 30 degrees  PLAN OF CARE: Admit to inpatient   PATIENT DISPOSITION:  PACU - hemodynamically stable.   Delay start of Pharmacological VTE agent (>24hrs) due to surgical blood loss or risk of bleeding: yes

## 2019-12-19 NOTE — Op Note (Signed)
12/19/2019  12:47 PM  PATIENT:  Wendy Velazquez  79 y.o. female  PRE-OPERATIVE DIAGNOSIS:  Lumbar foraminal stenosis, spondylolisthesis, scoliosis, lumbago, radiculopathy   POST-OPERATIVE DIAGNOSIS:   Lumbar foraminal stenosis, spondylolisthesis, scoliosis, lumbago, radiculopathy   PROCEDURE:  Procedure(s) with comments: Left Lumbar Two-Three, Lumbar Three-Four, Lumbar Four-Five Anterolateral lumbar Interbody Fusion (Left) - Left Lumbar 2-3 Lumbar 3-4 Lumbar 4-5 Anterolateral lumbar interbody fusion with percutaneous pedicle screws Lumbar Two-Three, Lumbar Three-Four, Lumbar Four-Five Percutaneous Pedicle Screw Placement (N/A)  SURGEON:  Surgeon(s) and Role:    Erline Levine, MD - Primary    * Judith Part, MD - Assisting  PHYSICIAN ASSISTANT:   ASSISTANTS: Poteat, RN   ANESTHESIA:   general  EBL:  100 mL   BLOOD ADMINISTERED:none  DRAINS: none   LOCAL MEDICATIONS USED:  MARCAINE    and LIDOCAINE   SPECIMEN:  No Specimen  DISPOSITION OF SPECIMEN:  N/A  COUNTS:  YES  TOURNIQUET:  * No tourniquets in log *  DICTATION: Patient is a 79 year old woman with severe spondylosis, stenosis, spondylolisthesis and scoliosis of the lumbar spine. It was elected to take her to surgery for anterolateral decompression and posterior pedicle screw fixation from L 23, L 34, L 45 levels.  Procedure: Patient was brought to the operating room and placed in a right lateral decubitus position on the operative table and using orthogonally projected C-arm fluoroscopy the patient was placed so that the L2-3 L3-4 and L 45 levels were visualized in AP and lateral plane. The patient was then taped into position. The table was flexed so as to expose the L 45 level as the patient has a high iliac crest. Skin was marked along with a posterior finger dissection incision. Her flank was then prepped and draped in usual sterile fashion and incisions were made sequentially at L 45,  L3-4 and L2-3 levels.  Posterior finger dissection was made to enter the retroperitoneal space and then subsequently the probe was inserted into the psoas muscle from the left side initially at the L 45 level. After mapping the neural elements were able to dock the probe per the midpoint of this vertebral level and without indications electrically of too close proximity to the neural tissues. Subsequently the self-retaining tractor was.after sequential dilators were utilized the shim was employed and the interspace was cleared of psoas muscle and then incised. A thorough discectomy was performed. Instruments were used to clear the interspace of disc material. An anterior entry with posterior trajectory was performed to avoid neural elements.   After thorough discectomy was performed and this was performed using AP and lateral fluoroscopy a 12 lordotic by 55 x 22 mm implant was packed with small BMP and Attrax. This was tamped into position and its position was confirmed on AP and lateral fluoroscopy. Subsequently exposure was performed at the L3-4 level and similar dissection was performed with locking of the self-retaining retractor. At this level were able to place a 10 lordotic x  22 x 6mm implant packed in a similar fashion. At the L2-3 level were able to place an 8 lordotic x 50 x 22 mm implant packed in a similar fashion. Hemostasis was assured the wounds were irrigated and closed with interrupted Vicryl sutures.  Sterile occlusive dressings were placed.    Patient was then turned into a prone position on the [place] table using AP and lateral fluoroscopy throughout this portion of the procedure, pedicle screws were placed using Reline Nuvasive cannulated percutaneous screws. 2  screws were placed at L2 and (5.5 x 45 mm) and 2 at L3 (6.5 x 45), and two at L4 and L 5 of a similar size. 100 mm rod was then affixed to the screw heads do a separate stab incision and locked down on the screws on the left and 110 mm rod on the right.  All connections were then torqued and the Towers were disassembled. The wounds were irrigated and then closed with 1, 2-0 and 3-0 Vicryl stitches. Sterile occlusive dressing was placed with Dermabond and occlusive dressings. The patient was then extubated in the operating room and taken to recovery in stable and satisfactory condition having tolerated her operation well. Counts were correct at the end of the case.  Pelvic Parameters:  Preop: PT 25; PI 42; LL25; PI-LL -19; SVA 85 mm.  Coronal curve 30 degrees  PLAN OF CARE: Admit to inpatient   PATIENT DISPOSITION:  PACU - hemodynamically stable.   Delay start of Pharmacological VTE agent (>24hrs) due to surgical blood loss or risk of bleeding: yes

## 2019-12-19 NOTE — Anesthesia Procedure Notes (Signed)
Procedure Name: Intubation Date/Time: 12/19/2019 7:40 AM Performed by: Jenne Campus, CRNA Pre-anesthesia Checklist: Patient identified, Emergency Drugs available, Suction available and Patient being monitored Patient Re-evaluated:Patient Re-evaluated prior to induction Oxygen Delivery Method: Circle System Utilized Preoxygenation: Pre-oxygenation with 100% oxygen Induction Type: IV induction Ventilation: Mask ventilation without difficulty Laryngoscope Size: Miller and 2 Grade View: Grade I Tube type: Oral Tube size: 7.0 mm Number of attempts: 1 Airway Equipment and Method: Stylet Placement Confirmation: ETT inserted through vocal cords under direct vision,  positive ETCO2 and breath sounds checked- equal and bilateral Secured at: 20 cm Tube secured with: Tape Dental Injury: Teeth and Oropharynx as per pre-operative assessment

## 2019-12-19 NOTE — Interval H&P Note (Signed)
History and Physical Interval Note:  12/19/2019 7:32 AM  [name]  has presented today for surgery, with the diagnosis of Lumbar foraminal stenosis.  The various methods of treatment have been discussed with the patient and family. After consideration of risks, benefits and other options for treatment, the patient has consented to  Procedure(s) with comments: Left Lumbar 2-3 Lumbar 3-4 Lumbar 4-5 Anterolateral lumbar interbody fusion with percutaneous pedicle screws (Left) - Left Lumbar 2-3 Lumbar 3-4 Lumbar 4-5 Anterolateral lumbar interbody fusion with percutaneous pedicle screws LUMBAR PERCUTANEOUS PEDICLE SCREW 3 LEVEL (N/A) as a surgical intervention.  The patient's history has been reviewed, patient examined, no change in status, stable for surgery.  I have reviewed the patient's chart and labs.  Questions were answered to the patient's satisfaction.     Peggyann Shoals

## 2019-12-19 NOTE — H&P (Signed)
Patient ID:   [id number] Patient: Wendy Velazquez  Date of Birth: 1941/05/30 Visit Type: Office Visit   Date: 10/25/2019 01:45 PM Provider: Marchia Meiers. Vertell Limber MD   This 79 year old female presents for back pain.  HISTORY OF PRESENT ILLNESS:  1.  back pain  Patient returns to review her scoliosis images.  She notes some persistent back pain, and notes significantly decreased leg and "nerve" pain since receiving her shingles vaccine.  I have reviewed the patient's imaging.  She has a 30 degree coronal curvature.  PT is 25, lumbar lordosis is 19, she has a +23 mismatch with kyphotic thoracolumbar junction and SVA of 85. Her bone density is significant for osteopenia.  The patient describes that she is miserable and is only able to stand for 5-6 minutes at a time before she has to sit down and is very uncomfortable.  I have reviewed imaging with the patient and discussed possible surgical plan.  This would consist of left-sided XLIF at L2-3, L3-4, L4-5 levels with percutaneous pedicle screw fixation.  This would be for performed at [facility].  She would like to do so.  This is going to be scheduled for February of 2021         Medical/Surgical/Interim History Reviewed, no change.  Last detailed document date:12/30/2017.     PAST MEDICAL HISTORY, SURGICAL HISTORY, FAMILY HISTORY, SOCIAL HISTORY AND REVIEW OF SYSTEMS I have reviewed the patient's past medical, surgical, family and social history as well as the comprehensive review of systems as included on the [facility] history form dated 10/01/2019, which I have signed.  Family History:  Reviewed, no changes.  Last detailed document date:12/30/2017.   Social History: Reviewed, no changes. Last detailed document date: 12/30/2017.    MEDICATIONS: (added, continued or stopped this visit) Started Medication Directions Instruction Stopped   amlodipine 5 mg tablet take 1 tablet by oral route  every day      AZO CRANBERRY as directed     Estropipate as directed     guaifenesin 400 mg tablet take 1 tablet by oral route  every 4 hours as needed     meloxicam 15 mg tablet take 1 tablet by oral route  every day     OSTEO BI-FLEX as directed     propranolol 10 mg tablet take 1 tablet by oral route 2 times every day     Provera 5 mg tablet take 1 tablet by oral route  every day     ranitidine 75 mg tablet take 1 tablet by oral route 2 times every day with glass of water     spironolactone 25 mg tablet take 1 tablet by oral route  every day     Synthroid 75 mcg tablet take 1 tablet by oral route  every day     Tylenol 325 mg tablet take 2 tablet by oral route  every 6 hours as needed       ALLERGIES: Ingredient Reaction Medication Name Comment  CODEINE      Reviewed, no changes.    PHYSICAL EXAM:   Vitals Date Temp F BP Pulse Ht In Wt Lb BMI BSA Pain Score  10/25/2019 97.1 175/94 65 63 173.2 30.68  2/10      IMPRESSION:   Scoliosis with foraminal stenosis and nerve root compression.  The risks and benefits of surgery were discussed in detail with the patient and she wishes to proceed.  PLAN:  XL IF on the  left, L2-3, L3-4, L4-5 levels with percutaneous pedicle screw fixation.  Patient was fitted for LSO brace.  Risks and benefits of surgery were discussed in detail with the patient and she wishes to proceed.  Patient education was performed today.  Orders: Diagnostic Procedures: Assessment Procedure  M99.83 Lumbar Spine- AP/Lat  Instruction(s)/Education: Assessment Instruction  R03.0 Lifestyle education  Z68.30 Dietary management education, guidance, and counseling  Miscellaneous: Assessment   M99.83 LSO Brace   Assessment/Plan   # Detail Type Description   1. Assessment Radiculopathy, lumbar region (M54.16).       2. Assessment Degenerative scoliosis (M41.9).       3. Assessment Synovial cyst of lumbar spine (M71.38).       4. Assessment Lumbar foraminal stenosis  (M99.83).   Plan Orders LSO Brace.       5. Assessment Degenerative lumbar spinal stenosis (M48.061).       6. Assessment Scoliosis (and kyphoscoliosis), idiopathic (M41.20).       7. Assessment Elevated blood-pressure reading, w/o diagnosis of htn (R03.0).       8. Assessment Body mass index (BMI) 30.0-30.9, adult (Z68.30).   Plan Orders Today's instructions / counseling include(s) Dietary management education, guidance, and counseling. Clinical information/comments: Encouraged patient to eat well balanced diet.         Pain Management Plan Pain Scale: 2/10. Method: Numeric Pain Intensity Scale. Location: back. Onset: 06/04/2017. Duration: varies. Quality: discomforting. Pain management follow-up plan of care: Patient will continue medication management..              Provider:  Marchia Meiers. Vertell Limber MD  10/28/2019 05:05 PM    Dictation edited by: Marchia Meiers. Vertell Limber    CC Providers: [place] [address] [place],  [place]  [id number]   Henry Elsner MD  [address] [place], [place] [id number]               Electronically signed by Marchia Meiers. Vertell Limber MD on 10/28/2019 05:05 PM

## 2019-12-19 NOTE — Transfer of Care (Signed)
Immediate Anesthesia Transfer of Care Note  Patient: Wendy Velazquez  Procedure(s) Performed: Left Lumbar Two-Three, Lumbar Three-Four, Lumbar Four-Five Anterolateral lumbar Interbody Fusion (Left Spine Lumbar) Lumbar Two-Three, Lumbar Three-Four, Lumbar Four-Five Percutaneous Pedicle Screw Placement (N/A Spine Lumbar)  Patient Location: PACU  Anesthesia Type:General  Level of Consciousness: oriented, drowsy and patient cooperative  Airway & Oxygen Therapy: Patient Spontanous Breathing and Patient connected to face mask oxygen  Post-op Assessment: Report given to RN and Post -op Vital signs reviewed and stable  Post vital signs: Reviewed  Last Vitals:  Vitals Value Taken Time  BP 123/80 12/19/19 1240  Temp 36.3 C 12/19/19 1240  Pulse 67 12/19/19 1252  Resp 14 12/19/19 1252  SpO2 93 % 12/19/19 1252  Vitals shown include unvalidated device data.  Last Pain:  Vitals:   12/19/19 0646  PainSc: 0-No pain         Complications: No apparent anesthesia complications

## 2019-12-19 NOTE — Progress Notes (Signed)
PHARMACIST - PHYSICIAN ORDER COMMUNICATION  CONCERNING: P&T Medication Policy on Herbal Medications  DESCRIPTION:  This patient's orders for:  Azo complete, Glucosamine/MSM, and Similsan sinus relief  have been noted.  This product(s) is classified as an "herbal" or natural product. Due to a lack of definitive safety studies or FDA approval, nonstandard manufacturing practices, plus the potential risk of unknown drug-drug interactions while on inpatient medications, the Pharmacy and Therapeutics Committee does not permit the use of "herbal" or natural products of this type within [facility].   ACTION TAKEN: The pharmacy department is unable to verify this order at this time and your patient has been informed of this safety policy. Please reevaluate patient's clinical condition at discharge and address if the herbal or natural product(s) should be resumed at that time.  Crystale Giannattasio A. Levada Dy, PharmD, BCPS, FNKF Clinical Pharmacist [place] Please utilize Amion for appropriate phone number to reach the unit pharmacist ([place])

## 2019-12-20 ENCOUNTER — Encounter: Payer: Self-pay | Admitting: *Deleted

## 2019-12-20 DIAGNOSIS — M4310 Spondylolisthesis, site unspecified: Secondary | ICD-10-CM | POA: Diagnosis not present

## 2019-12-20 DIAGNOSIS — M8580 Other specified disorders of bone density and structure, unspecified site: Secondary | ICD-10-CM | POA: Diagnosis not present

## 2019-12-20 DIAGNOSIS — M419 Scoliosis, unspecified: Secondary | ICD-10-CM | POA: Diagnosis not present

## 2019-12-20 DIAGNOSIS — M5416 Radiculopathy, lumbar region: Secondary | ICD-10-CM | POA: Diagnosis not present

## 2019-12-20 DIAGNOSIS — M48061 Spinal stenosis, lumbar region without neurogenic claudication: Secondary | ICD-10-CM | POA: Diagnosis not present

## 2019-12-20 DIAGNOSIS — M545 Low back pain: Secondary | ICD-10-CM | POA: Diagnosis not present

## 2019-12-20 MED ORDER — HYDROCODONE-ACETAMINOPHEN 5-325 MG PO TABS: 1.0000 | ORAL_TABLET | ORAL | 0 refills | Status: DC | PRN

## 2019-12-20 MED ORDER — METHOCARBAMOL 500 MG PO TABS: 500.0000 mg | ORAL_TABLET | Freq: Three times a day (TID) | ORAL | 1 refills | Status: DC | PRN

## 2019-12-20 NOTE — Discharge Summary (Signed)
Physician Discharge Summary  Patient ID: Wendy Velazquez MRN: [id number] DOB/AGE: 1941/06/08 79 y.o.  Admit date: 12/19/2019 Discharge date: 12/20/2019  Admission Diagnoses:Lumbar scoliosis, spondylolisthesis, stenosis, radiculopathy, lumbago  Discharge Diagnoses: Lumbar scoliosis, spondylolisthesis, stenosis, radiculopathy, lumbago  Active Problems:   Lumbar scoliosis   Discharged Condition: good  Hospital Course: Patient underwent decompression and fusion L 23, L 34, L 45 levels with percutaneous pedicle screw fixation.  She did well with surgery and was discharged home on AM of POD 1.  Consults: None  Significant Diagnostic Studies: None  Treatments: surgery: Patient underwent decompression and fusion L 23, L 34, L 45 levels with percutaneous pedicle screw fixation  Discharge Exam: Blood pressure 113/69, pulse 72, temperature 97.9 F (36.6 C), temperature source Oral, resp. rate 16, height 5\' 2"  (1.575 m), weight 78.4 kg, SpO2 93 %. Neurologic: Alert and oriented X 3, normal strength and tone. Normal symmetric reflexes. Normal coordination and gait Wound:CDI  Disposition: Home  Discharge Instructions    Diet - low sodium heart healthy   Complete by: As directed    Increase activity slowly   Complete by: As directed      Allergies as of 12/20/2019      Reactions   Antihistamines, Diphenhydramine-type Tinitus   Codeine Nausea Only   Can tolerate hydrocodone   Lactose Intolerance (gi) Diarrhea   Stomach pain    Gabapentin Rash      Medication List    STOP taking these medications   apixaban 5 MG Tabs tablet Commonly known as: Eliquis   meloxicam 15 MG tablet Commonly known as: MOBIC     TAKE these medications   amLODipine 5 MG tablet Commonly known as: NORVASC Take 1 tablet (5 mg total) by mouth daily.   ARTIFICIAL TEAR SOLUTION OP Place 1 drop into both eyes daily as needed (irritation).   AZO Complete Feminine Balance Caps Take 1 capsule by mouth  daily.   betamethasone dipropionate 0.05 % cream Apply topically 2 (two) times daily. Use thin layer on affected skin areas   docusate sodium 100 MG capsule Commonly known as: COLACE Take 200 mg by mouth daily as needed for mild constipation.   estradiol 0.5 MG tablet Commonly known as: ESTRACE Take 1 tablet (0.5 mg total) by mouth daily. What changed:   how much to take  when to take this  additional instructions   Glucosamine Chond Complex/MSM Tabs Take 1 tablet by mouth 2 (two) times daily.   guaifenesin 400 MG Tabs tablet Commonly known as: HUMIBID E Take 400 mg by mouth at bedtime.   HYDROcodone-acetaminophen 5-325 MG tablet Commonly known as: NORCO/VICODIN Take 1-2 tablets by mouth every 4 (four) hours as needed for severe pain ((score 7 to 10)).   medroxyPROGESTERone 5 MG tablet Commonly known as: PROVERA 1 tab po qd 10 days a month as directed.  Brand name necessary. What changed:   how much to take  how to take this  when to take this  additional instructions   methocarbamol 500 MG tablet Commonly known as: ROBAXIN Take 1 tablet (500 mg total) by mouth every 8 (eight) hours as needed for muscle spasms.   propranolol 10 MG tablet Commonly known as: INDERAL Take 1 tablet (10 mg total) by mouth 2 (two) times daily.   Salonpas Pain Relief Patch Ptch Apply 1 patch topically daily.   SIMILASAN SINUS RELIEF NA Place 1 spray into the nose daily as needed (congestion).   sodium chloride 0.65 % Soln nasal  spray Commonly known as: OCEAN Place 1 spray into both nostrils at bedtime as needed for congestion.   spironolactone 25 MG tablet Commonly known as: ALDACTONE 1/2 tab po qd What changed:   how much to take  how to take this  when to take this  additional instructions   Synthroid 75 MCG tablet Generic drug: levothyroxine Take 1 tablet (75 mcg total) by mouth daily. What changed: when to take this   Tylenol Arthritis Pain 650 MG CR  tablet Generic drug: acetaminophen Take 650 mg by mouth 2 (two) times daily.   Vitamin D-1000 Max St 25 MCG (1000 UT) tablet Generic drug: Cholecalciferol Take 1,000 Units by mouth daily.        Signed: Peggyann Shoals, MD 12/20/2019, 8:27 AM

## 2019-12-20 NOTE — Progress Notes (Signed)
Subjective: Patient reports "I'm doing well."  Objective: Vital signs in last 24 hours: Temp:  [97.3 F (36.3 C)-98.2 F (36.8 C)] 97.9 F (36.6 C) (02/03 0811) Pulse Rate:  [62-76] 72 (02/03 0811) Resp:  [10-31] 16 (02/03 0811) BP: (101-151)/(52-91) 113/69 (02/03 0811) SpO2:  [91 %-100 %] 93 % (02/03 0811)  Intake/Output from previous day: 02/02 0701 - 02/03 0700 In: 2130 [P.O.:480; I.V.:1300] Out: 1575 [Urine:1475; Blood:100] Intake/Output this shift: No intake/output data recorded.  Physical Exam: Full strength, no numbness.  Dressings CDI.  Lab Results: No results for input(s): WBC, HGB, HCT, PLT in the last 72 hours. BMET No results for input(s): NA, K, CL, CO2, GLUCOSE, BUN, CREATININE, CALCIUM in the last 72 hours.  Studies/Results: DG Lumbar Spine 2-3 Views  Result Date: 12/19/2019 CLINICAL DATA:  L2-L5 laminectomy and fusion EXAM: LUMBAR SPINE - 2-3 VIEW COMPARISON:  10/02/2019 FINDINGS: Seven fluoroscopic images were obtained during the performance of procedure and submitted for interpretation only. Frontal and lateral views of the lumbar spine demonstrate posterior fusion hardware spanning L2 through L5, with intervening discectomies. Alignment is near anatomic. FLUOROSCOPY TIME:  7 minutes 55 seconds. IMPRESSION: 1. L2-L5 discectomies and posterior fusions. Electronically Signed   By: Randa Ngo M.D.   On: 12/19/2019 12:28   DG C-Arm 1-60 Min  Result Date: 12/19/2019 CLINICAL DATA:  Lumbar disc disease. EXAM: DG C-ARM 1-60 MIN FLUOROSCOPY TIME:  Fluoroscopy Time: 7 minutes 55 seconds MRI dated 09/22/2018 COMPARISON:  None. FLUOROSCOPY TIME:  7 minutes 55 seconds C-arm fluoroscopic images were obtained intraoperatively and submitted for post operative interpretation. IMPRESSION: Fluoroscopy used during multilevel interbody and posterior fusion of the lumbar spine. Electronically Signed   By: Lorriane Shire M.D.   On: 12/19/2019 12:29    Assessment/Plan: Patient is  doing well.  Discharge home.    LOS: 1 day    Peggyann Shoals, MD 12/20/2019, 8:26 AM

## 2019-12-20 NOTE — Evaluation (Signed)
Physical Therapy Evaluation Patient Details Name: Wendy Velazquez MRN: [id number] DOB: 04/26/1941 Today's Date: 12/20/2019   History of Present Illness  Pt is a 79 y/o female who presents s/p L2-L5 ALIF on 12/19/2019. PMH significant for osteopenia, AV block s/p pacemaker implant 2020, hypothyroidism, HTN, prior back surgery 2019.   Clinical Impression  Pt admitted with above diagnosis. At the time of PT eval, pt was able to demonstrate transfers and ambulation with gross supervision for safety and no AD. Overall pt moving very slowly but steady without overt LOB noted. Pt was educated on precautions, brace application/wearing schedule, appropriate activity progression, and car transfer. Pt currently with functional limitations due to the deficits listed below (see PT Problem List). Pt will benefit from skilled PT to increase their independence and safety with mobility to allow discharge to the venue listed below.      Follow Up Recommendations No PT follow up;Supervision for mobility/OOB    Equipment Recommendations  None recommended by PT    Recommendations for Other Services       Precautions / Restrictions Precautions Precautions: Fall;Back Precaution Booklet Issued: No Precaution Comments: Reviewed back precautions verbally and pt was cued for maintenance of precautions during functional mobility.  Required Braces or Orthoses: Spinal Brace Spinal Brace: Lumbar corset;Applied in sitting position Restrictions Weight Bearing Restrictions: No      Mobility  Bed Mobility Overal bed mobility: Needs Assistance Bed Mobility: Rolling;Sidelying to Sit;Sit to Sidelying Rolling: Modified independent (Device/Increase time) Sidelying to sit: Supervision     Sit to sidelying: Supervision General bed mobility comments: Supervision for safety and cues for improved log roll technique. HOB flat and no use of rails to simulate home environment.   Transfers Overall transfer level: Modified  independent Equipment used: None Transfers: Sit to/from Stand           General transfer comment: Good hand placement on seated surface for safety. No assist required and no unsteadiness noted.   Ambulation/Gait Ambulation/Gait assistance: Supervision Gait Distance (Feet): 200 Feet Assistive device: None Gait Pattern/deviations: Step-through pattern;Decreased stride length;Trunk flexed;Narrow base of support Gait velocity: Decreased Gait velocity interpretation: <1.31 ft/sec, indicative of household ambulator General Gait Details: Very slow. Pt reports she never walks without shoes on so feels thar is why she is moving slower than normal. No gross unsteadiness or LOB noted. Supervision for safety.   Stairs            Wheelchair Mobility    Modified Rankin (Stroke Patients Only)       Balance Overall balance assessment: Needs assistance Sitting-balance support: Feet supported;No upper extremity supported Sitting balance-Leahy Scale: Fair     Standing balance support: No upper extremity supported;During functional activity Standing balance-Leahy Scale: Fair                               Pertinent Vitals/Pain Pain Assessment: Faces Faces Pain Scale: Hurts a little bit Pain Location: Incision site Pain Descriptors / Indicators: Operative site guarding Pain Intervention(s): Limited activity within patient's tolerance;Monitored during session;Repositioned    Home Living Family/patient expects to be discharged to:: Private residence Living Arrangements: Alone Available Help at Discharge: Personal care attendant;Available 24 hours/day Type of Home: House(Townhome) Home Access: Stairs to enter   Entrance Stairs-Number of Steps: 1 Home Layout: One level Home Equipment: Grab bars - tub/shower;Shower seat - built in      Prior Function Level of Independence: Independent  Comments: Works at [employer], drives, enjoys  ballroom dancing     Journalist, newspaper        Extremity/Trunk Assessment   Upper Extremity Assessment Upper Extremity Assessment: Defer to OT evaluation    Lower Extremity Assessment Lower Extremity Assessment: Generalized weakness(Consistent with pre-op diagnosis)    Cervical / Trunk Assessment Cervical / Trunk Assessment: Kyphotic;Other exceptions Cervical / Trunk Exceptions: s/p surgery  Communication   Communication: HOH  Cognition Arousal/Alertness: Awake/alert Behavior During Therapy: WFL for tasks assessed/performed Overall Cognitive Status: Within Functional Limits for tasks assessed                                        General Comments      Exercises     Assessment/Plan    PT Assessment Patient needs continued PT services  PT Problem List Decreased strength;Decreased activity tolerance;Decreased balance;Decreased mobility;Decreased knowledge of use of DME;Decreased safety awareness;Decreased knowledge of precautions;Pain       PT Treatment Interventions DME instruction;Gait training;Functional mobility training;Therapeutic activities;Therapeutic exercise;Neuromuscular re-education;Patient/family education    PT Goals (Current goals can be found in the Care Plan section)  Acute Rehab PT Goals Patient Stated Goal: Home today PT Goal Formulation: With patient Time For Goal Achievement: 12/27/19 Potential to Achieve Goals: Good    Frequency Min 5X/week   Barriers to discharge        Co-evaluation               AM-PAC PT "6 Clicks" Mobility  Outcome Measure Help needed turning from your back to your side while in a flat bed without using bedrails?: None Help needed moving from lying on your back to sitting on the side of a flat bed without using bedrails?: None Help needed moving to and from a bed to a chair (including a wheelchair)?: None Help needed standing up from a chair using your arms (e.g., wheelchair or bedside chair)?:  None Help needed to walk in hospital room?: None Help needed climbing 3-5 steps with a railing? : A Little 6 Click Score: 23    End of Session Equipment Utilized During Treatment: Gait belt;Back brace Activity Tolerance: Patient tolerated treatment well Patient left: Other (comment)(Sitting EOB with OT present) Nurse Communication: Mobility status PT Visit Diagnosis: Unsteadiness on feet (R26.81);Pain Pain - part of body: (back)    Time: [id number] PT Time Calculation (min) (ACUTE ONLY): 21 min   Charges:   PT Evaluation $PT Eval Moderate Complexity: 1 Mod          Rolinda Roan, PT, DPT Acute Rehabilitation Services Pager: [phone] Office: [phone]   Thelma Comp 12/20/2019, 10:01 AM

## 2019-12-20 NOTE — Discharge Instructions (Signed)

## 2019-12-20 NOTE — Evaluation (Addendum)
Occupational Therapy Evaluation Patient Details Name: Wendy Velazquez MRN: [id number] DOB: 31-Jan-1941 Today's Date: 12/20/2019    History of Present Illness Pt is a 79 y/o female who presents s/p L2-L5 ALIF on 12/19/2019. PMH significant for osteopenia, AV block s/p pacemaker implant 2020, hypothyroidism, HTN, prior back surgery 2019.    Clinical Impression   This 79 y/o female presents with the above. PTA pt living alone and independent with ADL and mobility. Pt overall performing functional mobility today without AD at minguard assist level; completing seated UB ADL with supervision, LB ADL with minguard assist. Educated and reviewed with pt re: back precautions, brace management, safety and compensatory techniques for completing ADL and functional transfers after return home with pt verbalizing and return demonstrating good understanding throughout session. Pt reports plans to have a CNA stay/assist with ADL initially PRN after discharge home. She will benefit from continued OT services while in acute setting to maximize her safety and independence with ADL and mobility. Will follow.     Follow Up Recommendations  No OT follow up;Supervision/Assistance - 24 hour(24hr initially)    Equipment Recommendations  None recommended by OT           Precautions / Restrictions Precautions Precautions: Fall;Back Precaution Booklet Issued: Yes (comment) Precaution Comments: Reviewed back precautions verbally and pt was cued for maintenance of precautions during functional mobility.  Required Braces or Orthoses: Spinal Brace Spinal Brace: Lumbar corset;Applied in sitting position Restrictions Weight Bearing Restrictions: No      Mobility Bed Mobility Overal bed mobility: Needs Assistance Bed Mobility: Rolling;Sidelying to Sit;Sit to Sidelying Rolling: Modified independent (Device/Increase time) Sidelying to sit: Supervision     Sit to sidelying: Supervision General bed mobility comments:  Supervision for safety and cues for improved log roll technique. HOB flat and no use of rails to simulate home environment.   Transfers Overall transfer level: Modified independent Equipment used: None Transfers: Sit to/from Stand Sit to Stand: Supervision         General transfer comment: Good hand placement on seated surface for safety. No assist required and no unsteadiness noted.     Balance Overall balance assessment: Needs assistance Sitting-balance support: Feet supported;No upper extremity supported Sitting balance-Leahy Scale: Good     Standing balance support: No upper extremity supported;During functional activity Standing balance-Leahy Scale: Fair                             ADL either performed or assessed with clinical judgement   ADL Overall ADL's : Needs assistance/impaired Eating/Feeding: Modified independent;Sitting   Grooming: Min guard;Standing   Upper Body Bathing: Set up;Supervision/ safety;Sitting   Lower Body Bathing: Min guard;Sit to/from stand   Upper Body Dressing : Set up;Supervision/safety;Sitting Upper Body Dressing Details (indicate cue type and reason): pt donning UB clothing and lumbar brace with min cues for brace management Lower Body Dressing: Min guard;Sit to/from stand Lower Body Dressing Details (indicate cue type and reason): donning pants using figure 4 technique Toilet Transfer: Min guard;Ambulation Toilet Transfer Details (indicate cue type and reason): simulated via room level mobility, transfer to EOB and recliner Toileting- Clothing Manipulation and Hygiene: Min guard;Sit to/from stand       Functional mobility during ADLs: Min guard                           Pertinent Vitals/Pain Pain Assessment: Faces Faces Pain Scale: Hurts a  little bit Pain Location: Incision site Pain Descriptors / Indicators: Operative site guarding Pain Intervention(s): Limited activity within patient's tolerance;Monitored  during session;Repositioned     Hand Dominance     Extremity/Trunk Assessment Upper Extremity Assessment Upper Extremity Assessment: Overall WFL for tasks assessed   Lower Extremity Assessment Lower Extremity Assessment: Defer to PT evaluation   Cervical / Trunk Assessment Cervical / Trunk Assessment: Kyphotic;Other exceptions Cervical / Trunk Exceptions: s/p surgery   Communication Communication Communication: HOH   Cognition Arousal/Alertness: Awake/alert Behavior During Therapy: WFL for tasks assessed/performed Overall Cognitive Status: Within Functional Limits for tasks assessed                                     General Comments       Exercises     Shoulder Instructions      Home Living Family/patient expects to be discharged to:: Private residence Living Arrangements: Alone Available Help at Discharge: Personal care attendant;Available 24 hours/day Type of Home: House(Townhome) Home Access: Stairs to enter Entrance Stairs-Number of Steps: 1   Home Layout: One level     Bathroom Shower/Tub: Occupational psychologist: Handicapped height     Home Equipment: Grab bars - tub/shower;Shower seat - built in          Prior Functioning/Environment Level of Independence: Independent        Comments: Works at [employer], drives, enjoys ballroom dancing        OT Problem List: Decreased strength;Decreased activity tolerance;Impaired balance (sitting and/or standing);Decreased knowledge of use of DME or AE;Decreased knowledge of precautions;Pain      OT Treatment/Interventions: Self-care/ADL training;Therapeutic exercise;DME and/or AE instruction;Therapeutic activities;Patient/family education;Balance training    OT Goals(Current goals can be found in the care plan section) Acute Rehab OT Goals Patient Stated Goal: Home today OT Goal Formulation: With patient Time For Goal Achievement: 01/03/20 Potential to  Achieve Goals: Good  OT Frequency: Min 2X/week   Barriers to D/C:            Co-evaluation              AM-PAC OT "6 Clicks" Daily Activity     Outcome Measure Help from another person eating meals?: None Help from another person taking care of personal grooming?: None Help from another person toileting, which includes using toliet, bedpan, or urinal?: A Little Help from another person bathing (including washing, rinsing, drying)?: A Little Help from another person to put on and taking off regular upper body clothing?: None Help from another person to put on and taking off regular lower body clothing?: A Little 6 Click Score: 21   End of Session Equipment Utilized During Treatment: Back brace Nurse Communication: Mobility status  Activity Tolerance: Patient tolerated treatment well Patient left: in chair;with call bell/phone within reach  OT Visit Diagnosis: Other abnormalities of gait and mobility (R26.89);Pain Pain - part of body: (back)                Time: [id number] OT Time Calculation (min): 26 min Charges:  OT General Charges $OT Visit: 1 Visit OT Evaluation $OT Eval Low Complexity: 1 Low OT Treatments $Self Care/Home Management : 8-22 mins  Lou Cal, OT Supplemental Rehabilitation Services Pager [phone] Office [phone]  Raymondo Band 12/20/2019, 10:19 AM

## 2019-12-20 NOTE — Care Management Obs Status (Signed)
[facility] NOTIFICATION   Patient Details  Name: CHIFFON SELLEN MRN: [id number] Date of Birth: Mar 01, 1941   Medicare Observation Status Notification Given:       Ella Bodo, RN 12/20/2019, 9:52 AM

## 2019-12-20 NOTE — Care Management CC44 (Signed)
Condition Code 44 Documentation Completed  Patient Details  Name: Wendy Velazquez MRN: [id number] Date of Birth: 04/16/1941   Condition Code 44 given:  Yes Patient signature on Condition Code 44 notice:  Yes Documentation of 2 MD's agreement:  Yes Code 44 added to claim:  Yes    Ella Bodo, RN 12/20/2019, 9:53 AM

## 2019-12-20 NOTE — Plan of Care (Signed)
Pt doing well. Pt given D/C instructions with verbal understanding. Rx's were sent to pharmacy by MD. Pt's incision is clean and dry with no sign of infection. Pt's IV was removed prior to D/C. Pt D/C'd home via wheelchair per MD order. Pt is stable @ D/C and has no other needs at this time. Kingsley Farace, RN  

## 2019-12-21 NOTE — Anesthesia Postprocedure Evaluation (Signed)
Anesthesia Post Note  Patient: Wendy Velazquez  Procedure(s) Performed: Left Lumbar Two-Three, Lumbar Three-Four, Lumbar Four-Five Anterolateral lumbar Interbody Fusion (Left Spine Lumbar) Lumbar Two-Three, Lumbar Three-Four, Lumbar Four-Five Percutaneous Pedicle Screw Placement (N/A Spine Lumbar)     Patient location during evaluation: PACU Anesthesia Type: General Level of consciousness: sedated and patient cooperative Pain management: pain level controlled Vital Signs Assessment: post-procedure vital signs reviewed and stable Respiratory status: spontaneous breathing Cardiovascular status: stable Anesthetic complications: no    Last Vitals:  Vitals:   12/20/19 0350 12/20/19 0811  BP: (!) 101/52 113/69  Pulse: 71 72  Resp: 18 16  Temp: 36.4 C 36.6 C  SpO2: 93% 93%    Last Pain:  Vitals:   12/20/19 1020  TempSrc:   PainSc: [place]

## 2019-12-22 DIAGNOSIS — Z8551 Personal history of malignant neoplasm of bladder: Secondary | ICD-10-CM | POA: Diagnosis not present

## 2019-12-22 DIAGNOSIS — Z87891 Personal history of nicotine dependence: Secondary | ICD-10-CM | POA: Diagnosis not present

## 2019-12-22 DIAGNOSIS — E669 Obesity, unspecified: Secondary | ICD-10-CM | POA: Diagnosis not present

## 2019-12-22 DIAGNOSIS — Z950 Presence of cardiac pacemaker: Secondary | ICD-10-CM | POA: Diagnosis not present

## 2019-12-22 DIAGNOSIS — M810 Age-related osteoporosis without current pathological fracture: Secondary | ICD-10-CM | POA: Diagnosis not present

## 2019-12-22 DIAGNOSIS — Z7901 Long term (current) use of anticoagulants: Secondary | ICD-10-CM | POA: Diagnosis not present

## 2019-12-22 DIAGNOSIS — Z981 Arthrodesis status: Secondary | ICD-10-CM | POA: Diagnosis not present

## 2019-12-22 DIAGNOSIS — I441 Atrioventricular block, second degree: Secondary | ICD-10-CM | POA: Diagnosis not present

## 2019-12-22 DIAGNOSIS — E039 Hypothyroidism, unspecified: Secondary | ICD-10-CM | POA: Diagnosis not present

## 2019-12-22 DIAGNOSIS — I480 Paroxysmal atrial fibrillation: Secondary | ICD-10-CM | POA: Diagnosis not present

## 2019-12-22 DIAGNOSIS — Z4789 Encounter for other orthopedic aftercare: Secondary | ICD-10-CM | POA: Diagnosis not present

## 2019-12-22 DIAGNOSIS — M150 Primary generalized (osteo)arthritis: Secondary | ICD-10-CM | POA: Diagnosis not present

## 2019-12-22 DIAGNOSIS — Z85820 Personal history of malignant melanoma of skin: Secondary | ICD-10-CM | POA: Diagnosis not present

## 2019-12-22 DIAGNOSIS — Z6830 Body mass index (BMI) 30.0-30.9, adult: Secondary | ICD-10-CM | POA: Diagnosis not present

## 2019-12-22 DIAGNOSIS — Z79891 Long term (current) use of opiate analgesic: Secondary | ICD-10-CM | POA: Diagnosis not present

## 2019-12-22 DIAGNOSIS — M545 Low back pain: Secondary | ICD-10-CM | POA: Diagnosis not present

## 2019-12-22 DIAGNOSIS — I10 Essential (primary) hypertension: Secondary | ICD-10-CM | POA: Diagnosis not present

## 2019-12-22 DIAGNOSIS — H9193 Unspecified hearing loss, bilateral: Secondary | ICD-10-CM | POA: Diagnosis not present

## 2019-12-26 DIAGNOSIS — I10 Essential (primary) hypertension: Secondary | ICD-10-CM | POA: Diagnosis not present

## 2019-12-26 DIAGNOSIS — I441 Atrioventricular block, second degree: Secondary | ICD-10-CM | POA: Diagnosis not present

## 2019-12-26 DIAGNOSIS — M545 Low back pain: Secondary | ICD-10-CM | POA: Diagnosis not present

## 2019-12-26 DIAGNOSIS — Z981 Arthrodesis status: Secondary | ICD-10-CM | POA: Diagnosis not present

## 2019-12-26 DIAGNOSIS — Z4789 Encounter for other orthopedic aftercare: Secondary | ICD-10-CM | POA: Diagnosis not present

## 2019-12-26 DIAGNOSIS — I480 Paroxysmal atrial fibrillation: Secondary | ICD-10-CM | POA: Diagnosis not present

## 2019-12-27 ENCOUNTER — Telehealth: Payer: Self-pay

## 2019-12-27 DIAGNOSIS — I480 Paroxysmal atrial fibrillation: Secondary | ICD-10-CM | POA: Diagnosis not present

## 2019-12-27 DIAGNOSIS — I441 Atrioventricular block, second degree: Secondary | ICD-10-CM | POA: Diagnosis not present

## 2019-12-27 DIAGNOSIS — M545 Low back pain: Secondary | ICD-10-CM | POA: Diagnosis not present

## 2019-12-27 DIAGNOSIS — Z981 Arthrodesis status: Secondary | ICD-10-CM | POA: Diagnosis not present

## 2019-12-27 DIAGNOSIS — I10 Essential (primary) hypertension: Secondary | ICD-10-CM | POA: Diagnosis not present

## 2019-12-27 DIAGNOSIS — Z4789 Encounter for other orthopedic aftercare: Secondary | ICD-10-CM | POA: Diagnosis not present

## 2019-12-27 MED FILL — Sodium Chloride IV Soln 0.9%: INTRAVENOUS | Qty: 1000 | Status: AC

## 2019-12-27 MED FILL — Heparin Sodium (Porcine) Inj 1000 Unit/ML: INTRAMUSCULAR | Qty: 30 | Status: AC

## 2019-12-27 NOTE — Telephone Encounter (Signed)
Appointment cancelled and pt will call back to reschedule vaccine at a later time per appt notes on 12/27/19.

## 2019-12-27 NOTE — Telephone Encounter (Signed)
Attempted to reach pt; left VM to CB.

## 2019-12-27 NOTE — Telephone Encounter (Signed)
Pt called to get her vaccine appt from 2/14 because she had back surgery and she is not comfortable coming out. She would like for her app to be rescheduled for a later date in March.  Will have nurse call back.   [place]

## 2019-12-29 DIAGNOSIS — I10 Essential (primary) hypertension: Secondary | ICD-10-CM | POA: Diagnosis not present

## 2019-12-29 DIAGNOSIS — Z981 Arthrodesis status: Secondary | ICD-10-CM | POA: Diagnosis not present

## 2019-12-29 DIAGNOSIS — M545 Low back pain: Secondary | ICD-10-CM | POA: Diagnosis not present

## 2019-12-29 DIAGNOSIS — I480 Paroxysmal atrial fibrillation: Secondary | ICD-10-CM | POA: Diagnosis not present

## 2019-12-29 DIAGNOSIS — Z4789 Encounter for other orthopedic aftercare: Secondary | ICD-10-CM | POA: Diagnosis not present

## 2019-12-29 DIAGNOSIS — I441 Atrioventricular block, second degree: Secondary | ICD-10-CM | POA: Diagnosis not present

## 2019-12-31 ENCOUNTER — Ambulatory Visit: Payer: Medicare Other

## 2020-01-03 DIAGNOSIS — Z981 Arthrodesis status: Secondary | ICD-10-CM | POA: Diagnosis not present

## 2020-01-03 DIAGNOSIS — M545 Low back pain: Secondary | ICD-10-CM | POA: Diagnosis not present

## 2020-01-03 DIAGNOSIS — I10 Essential (primary) hypertension: Secondary | ICD-10-CM | POA: Diagnosis not present

## 2020-01-03 DIAGNOSIS — I480 Paroxysmal atrial fibrillation: Secondary | ICD-10-CM | POA: Diagnosis not present

## 2020-01-03 DIAGNOSIS — I441 Atrioventricular block, second degree: Secondary | ICD-10-CM | POA: Diagnosis not present

## 2020-01-03 DIAGNOSIS — Z4789 Encounter for other orthopedic aftercare: Secondary | ICD-10-CM | POA: Diagnosis not present

## 2020-01-05 DIAGNOSIS — I441 Atrioventricular block, second degree: Secondary | ICD-10-CM | POA: Diagnosis not present

## 2020-01-05 DIAGNOSIS — I480 Paroxysmal atrial fibrillation: Secondary | ICD-10-CM | POA: Diagnosis not present

## 2020-01-05 DIAGNOSIS — Z981 Arthrodesis status: Secondary | ICD-10-CM | POA: Diagnosis not present

## 2020-01-05 DIAGNOSIS — Z4789 Encounter for other orthopedic aftercare: Secondary | ICD-10-CM | POA: Diagnosis not present

## 2020-01-05 DIAGNOSIS — M545 Low back pain: Secondary | ICD-10-CM | POA: Diagnosis not present

## 2020-01-05 DIAGNOSIS — I10 Essential (primary) hypertension: Secondary | ICD-10-CM | POA: Diagnosis not present

## 2020-01-10 DIAGNOSIS — M7138 Other bursal cyst, other site: Secondary | ICD-10-CM | POA: Diagnosis not present

## 2020-01-10 DIAGNOSIS — Z6830 Body mass index (BMI) 30.0-30.9, adult: Secondary | ICD-10-CM | POA: Diagnosis not present

## 2020-01-10 DIAGNOSIS — M419 Scoliosis, unspecified: Secondary | ICD-10-CM | POA: Diagnosis not present

## 2020-01-10 DIAGNOSIS — M9983 Other biomechanical lesions of lumbar region: Secondary | ICD-10-CM | POA: Diagnosis not present

## 2020-01-10 DIAGNOSIS — M5416 Radiculopathy, lumbar region: Secondary | ICD-10-CM | POA: Diagnosis not present

## 2020-01-10 DIAGNOSIS — M4120 Other idiopathic scoliosis, site unspecified: Secondary | ICD-10-CM | POA: Diagnosis not present

## 2020-01-10 DIAGNOSIS — I10 Essential (primary) hypertension: Secondary | ICD-10-CM | POA: Diagnosis not present

## 2020-01-10 DIAGNOSIS — M48061 Spinal stenosis, lumbar region without neurogenic claudication: Secondary | ICD-10-CM | POA: Diagnosis not present

## 2020-01-18 ENCOUNTER — Ambulatory Visit: Payer: Medicare Other | Attending: Internal Medicine

## 2020-01-18 DIAGNOSIS — Z23 Encounter for immunization: Secondary | ICD-10-CM | POA: Insufficient documentation

## 2020-01-18 NOTE — Progress Notes (Signed)
   Covid-19 Vaccination Clinic  Name:  Wendy Velazquez    MRN: [id number] DOB: Jan 03, 1941  01/18/2020  Wendy Velazquez was observed post Covid-19 immunization for 15 minutes without incident. She was provided with Vaccine Information Sheet and instruction to access the V-Safe system.   Wendy Velazquez was instructed to call 911 with any severe reactions post vaccine: Marland Kitchen Difficulty breathing  . Swelling of face and throat  . A fast heartbeat  . A bad rash all over body  . Dizziness and weakness   Immunizations Administered    Name Date Dose VIS Date Route   Pfizer COVID-19 Vaccine 01/18/2020  4:07 PM 0.3 mL 10/27/2019 Intramuscular   Manufacturer: [place]   Lot: [id number]   [place]: [id number]

## 2020-01-23 DIAGNOSIS — H524 Presbyopia: Secondary | ICD-10-CM | POA: Diagnosis not present

## 2020-01-23 DIAGNOSIS — Z961 Presence of intraocular lens: Secondary | ICD-10-CM | POA: Diagnosis not present

## 2020-02-14 ENCOUNTER — Ambulatory Visit (INDEPENDENT_AMBULATORY_CARE_PROVIDER_SITE_OTHER): Payer: Medicare Other | Admitting: *Deleted

## 2020-02-14 ENCOUNTER — Ambulatory Visit: Payer: Medicare Other | Attending: Internal Medicine

## 2020-02-14 ENCOUNTER — Telehealth: Payer: Self-pay | Admitting: Emergency Medicine

## 2020-02-14 DIAGNOSIS — Z23 Encounter for immunization: Secondary | ICD-10-CM

## 2020-02-14 DIAGNOSIS — I441 Atrioventricular block, second degree: Secondary | ICD-10-CM

## 2020-02-14 LAB — CUP PACEART REMOTE DEVICE CHECK
Battery Remaining Longevity: 93 mo
Battery Remaining Percentage: 95.5 %
Battery Voltage: 3.01 V
Brady Statistic AP VP Percent: 39 %
Brady Statistic AP VS Percent: 1 %
Brady Statistic AS VP Percent: 61 %
Brady Statistic AS VS Percent: 1 %
Brady Statistic RA Percent Paced: 38 %
Brady Statistic RV Percent Paced: 99 %
Date Time Interrogation Session: 20210331020014
Implantable Lead Implant Date: 20200930
Implantable Lead Implant Date: 20200930
Implantable Lead Location: 753859
Implantable Lead Location: 753860
Implantable Lead Model: 3830
Implantable Lead Model: 5076
Implantable Pulse Generator Implant Date: 20200930
Lead Channel Impedance Value: 480 Ohm
Lead Channel Impedance Value: 640 Ohm
Lead Channel Pacing Threshold Amplitude: 0.5 V
Lead Channel Pacing Threshold Amplitude: 0.75 V
Lead Channel Pacing Threshold Pulse Width: 0.4 ms
Lead Channel Pacing Threshold Pulse Width: 1 ms
Lead Channel Sensing Intrinsic Amplitude: 12 mV
Lead Channel Sensing Intrinsic Amplitude: 2.2 mV
Lead Channel Setting Pacing Amplitude: 2 V
Lead Channel Setting Pacing Amplitude: 2.5 V
Lead Channel Setting Pacing Pulse Width: 1 ms
Lead Channel Setting Sensing Sensitivity: 2 mV
Pulse Gen Model: 2272
Pulse Gen Serial Number: 9156742

## 2020-02-14 NOTE — Telephone Encounter (Signed)
Alert received for AF episode on 91 day transmission.Spoke with patient to determine if she has started Eliquis 5 mg BID recommended by Dr Lovena Le at last visit. Patient had episode of AF 12/22/19 that lasted 7 hours and 58 minutes with controlled v-rates. Patient has not started Eliquis because she reports she has been on BP meds for years to prevent a stroke and does not need another med for stroke prevention. Education provided on stroke prevention related to AF verses HTN. Patient declines to start Eliquis until she has 2nd opinion from PCP.

## 2020-02-14 NOTE — Progress Notes (Signed)
   Covid-19 Vaccination Clinic  Name:  Wendy Velazquez    MRN: [id number] DOB: August 19, 1941  02/14/2020  [name] was observed post Covid-19 immunization for 15 minutes without incident. She was provided with Vaccine Information Sheet and instruction to access the V-Safe system.   [name] was instructed to call 911 with any severe reactions post vaccine: Marland Kitchen Difficulty breathing  . Swelling of face and throat  . A fast heartbeat  . A bad rash all over body  . Dizziness and weakness   Immunizations Administered    Name Date Dose VIS Date Route   Pfizer COVID-19 Vaccine 02/14/2020  1:42 PM 0.3 mL 10/27/2019 Intramuscular   Manufacturer: [employer]   Lot: [id number]   [place]: [id number]

## 2020-02-14 NOTE — Progress Notes (Signed)
PPM Remote  

## 2020-02-28 DIAGNOSIS — Z6830 Body mass index (BMI) 30.0-30.9, adult: Secondary | ICD-10-CM | POA: Diagnosis not present

## 2020-02-28 DIAGNOSIS — I10 Essential (primary) hypertension: Secondary | ICD-10-CM | POA: Diagnosis not present

## 2020-02-28 DIAGNOSIS — M48061 Spinal stenosis, lumbar region without neurogenic claudication: Secondary | ICD-10-CM | POA: Diagnosis not present

## 2020-02-28 DIAGNOSIS — M7138 Other bursal cyst, other site: Secondary | ICD-10-CM | POA: Diagnosis not present

## 2020-02-28 DIAGNOSIS — M4120 Other idiopathic scoliosis, site unspecified: Secondary | ICD-10-CM | POA: Diagnosis not present

## 2020-02-28 DIAGNOSIS — M419 Scoliosis, unspecified: Secondary | ICD-10-CM | POA: Diagnosis not present

## 2020-02-28 DIAGNOSIS — M5416 Radiculopathy, lumbar region: Secondary | ICD-10-CM | POA: Diagnosis not present

## 2020-03-12 ENCOUNTER — Ambulatory Visit (INDEPENDENT_AMBULATORY_CARE_PROVIDER_SITE_OTHER): Payer: Medicare Other

## 2020-03-12 ENCOUNTER — Encounter: Payer: Self-pay | Admitting: Family Medicine

## 2020-03-12 ENCOUNTER — Other Ambulatory Visit: Payer: Self-pay

## 2020-03-12 ENCOUNTER — Ambulatory Visit: Payer: Medicare Other

## 2020-03-12 DIAGNOSIS — Z23 Encounter for immunization: Secondary | ICD-10-CM

## 2020-03-12 NOTE — Progress Notes (Signed)
Patient here for second shingles vaccine.  Explain again to patient about the cost and that this vaccine is not covered if given in the office.  She stated that her supplemental insurance [employer] covers at 100%.  ABN form given and sign by patient.    Vaccine given in right deltoid and patient tolerated well.

## 2020-03-21 IMAGING — DX DG CHEST 2V
2 series · 2 of 2 positions shown · non-contrast
Comparison: June 06, 2009

CLINICAL DATA: Shortness of breath.  Bradycardia.

EXAM:
CHEST - 2 VIEW

[chest pa]
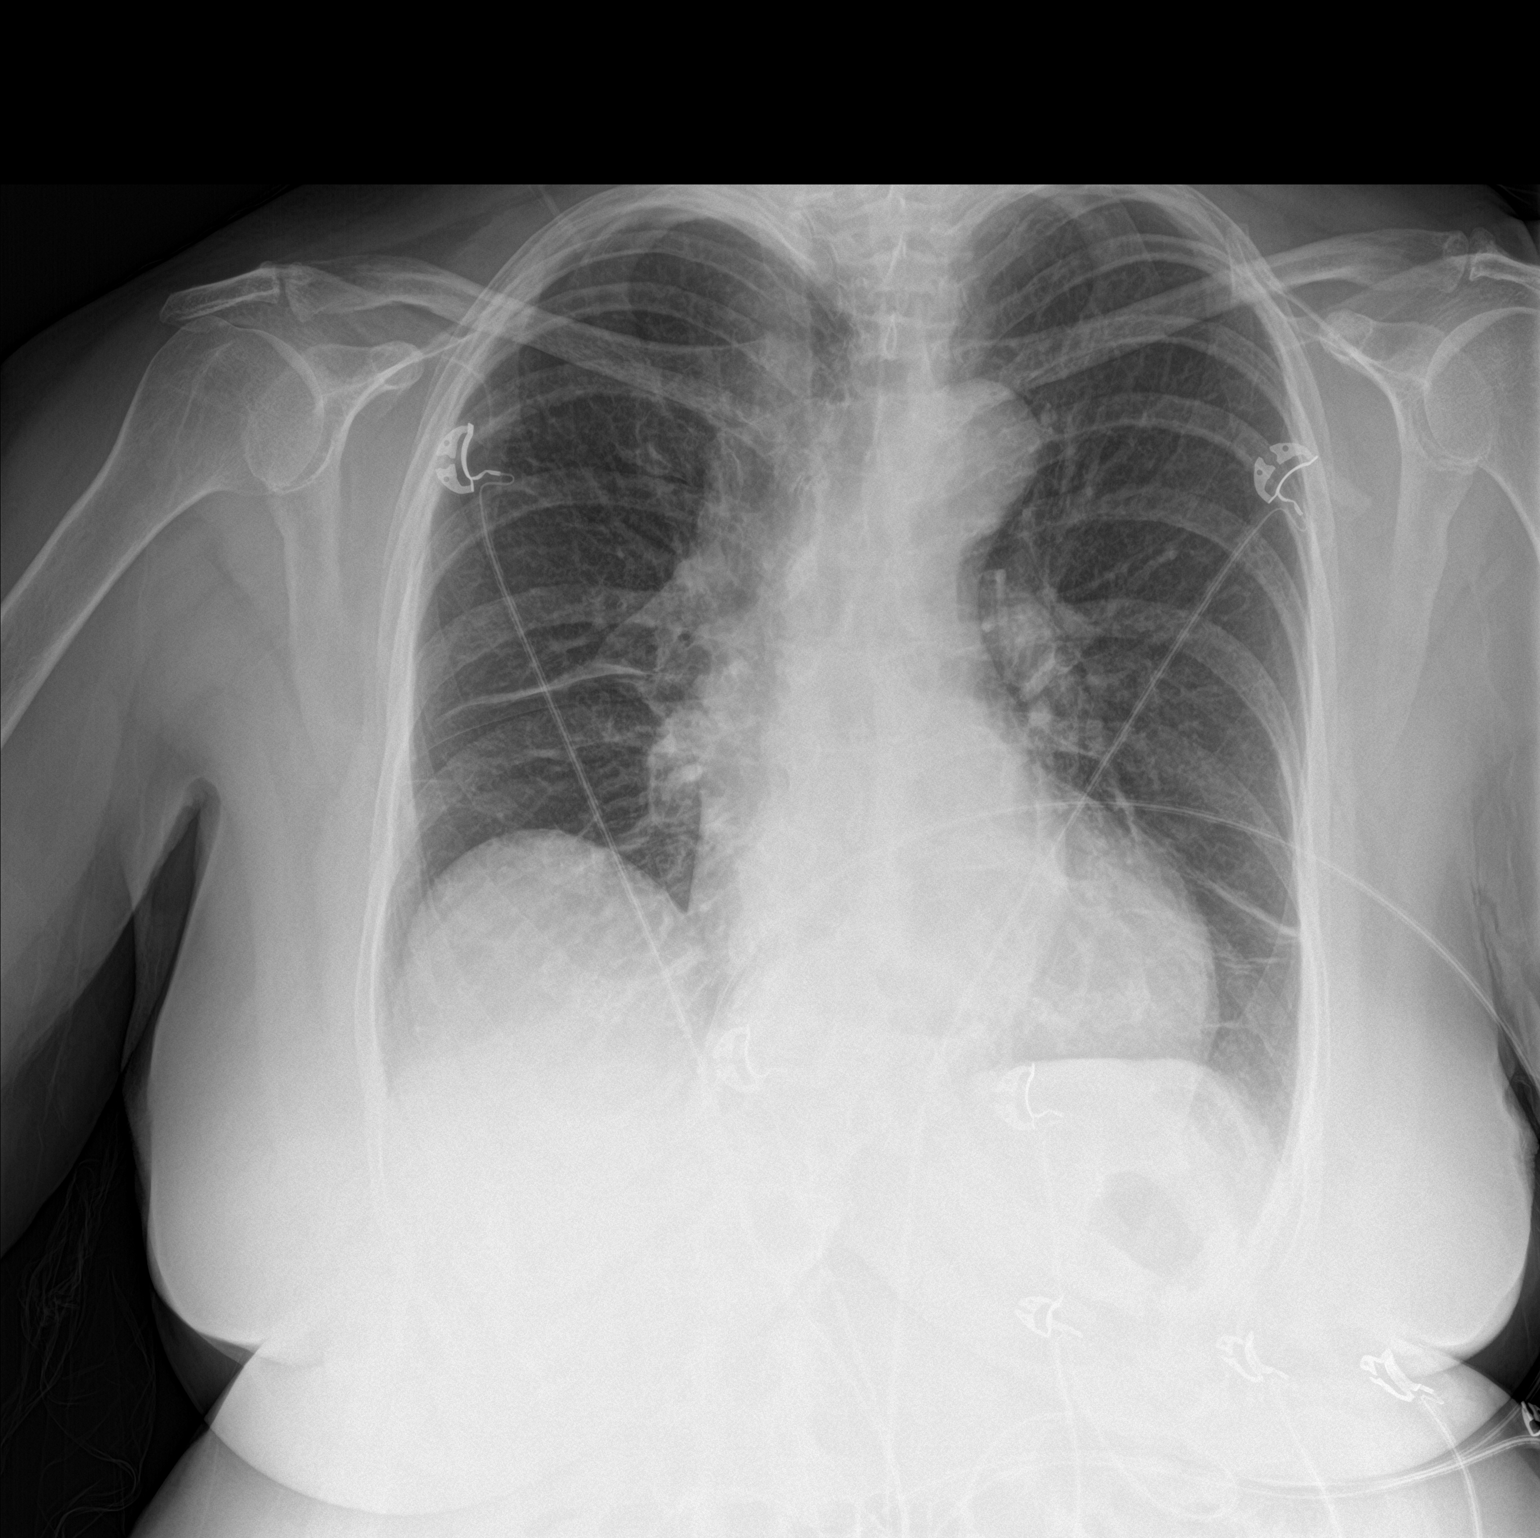

[chest lat]
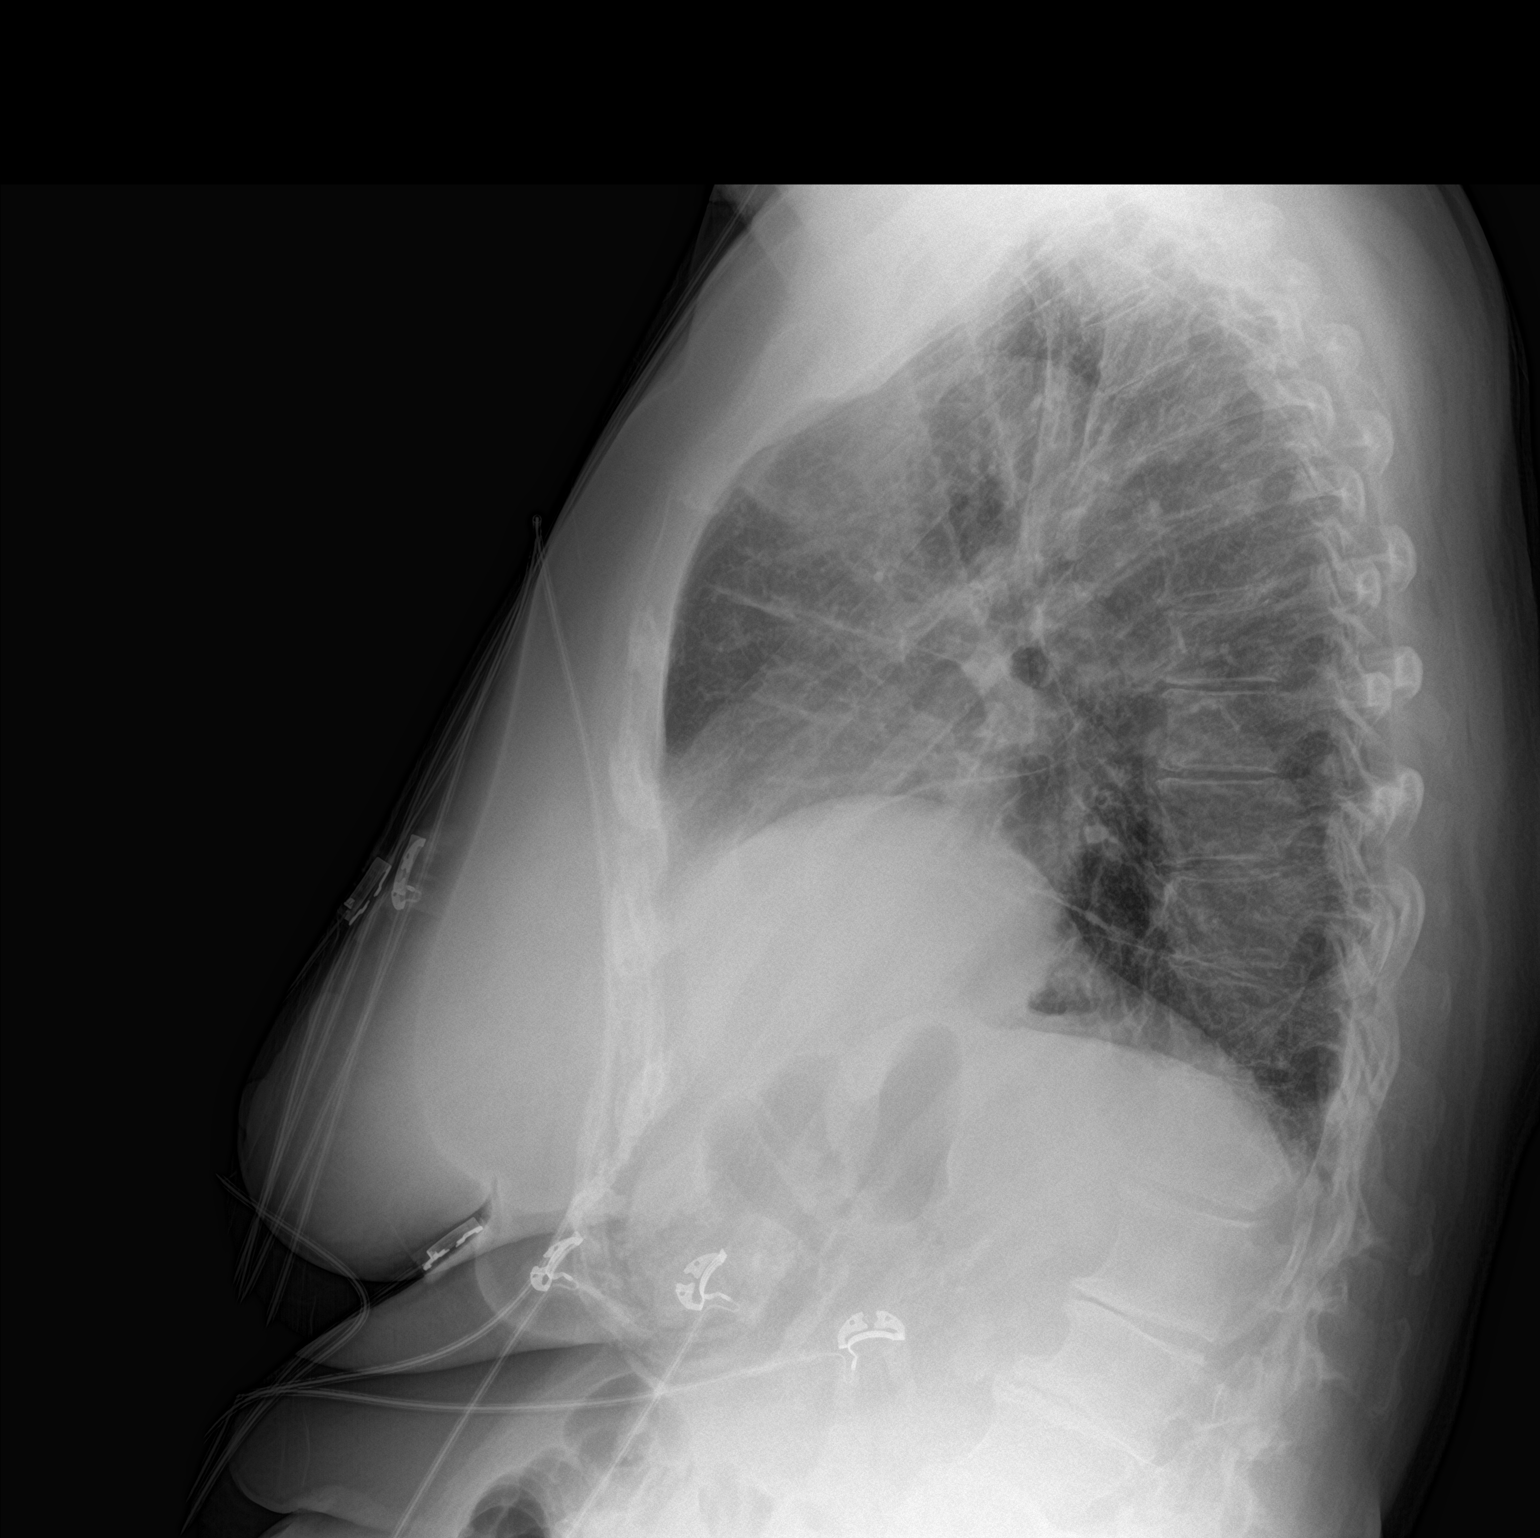

[2 of 2 positions shown; findings below may reference images not displayed]

FINDINGS: Platelike opacities in the right mid lung and left base are
consistent with scar or atelectasis. Elevation of the right
hemidiaphragm persists. The cardiomediastinal silhouette is stable.
No pneumothorax. No nodules or masses. No focal infiltrates.
IMPRESSION: No active cardiopulmonary disease.

## 2020-03-23 IMAGING — CR DG CHEST 2V
2 series · 2 of 2 positions shown · non-contrast
Comparison: 08/15/2019

CLINICAL DATA: Postop from pacemaker placement

EXAM:
CHEST - 2 VIEW

[chest pa]
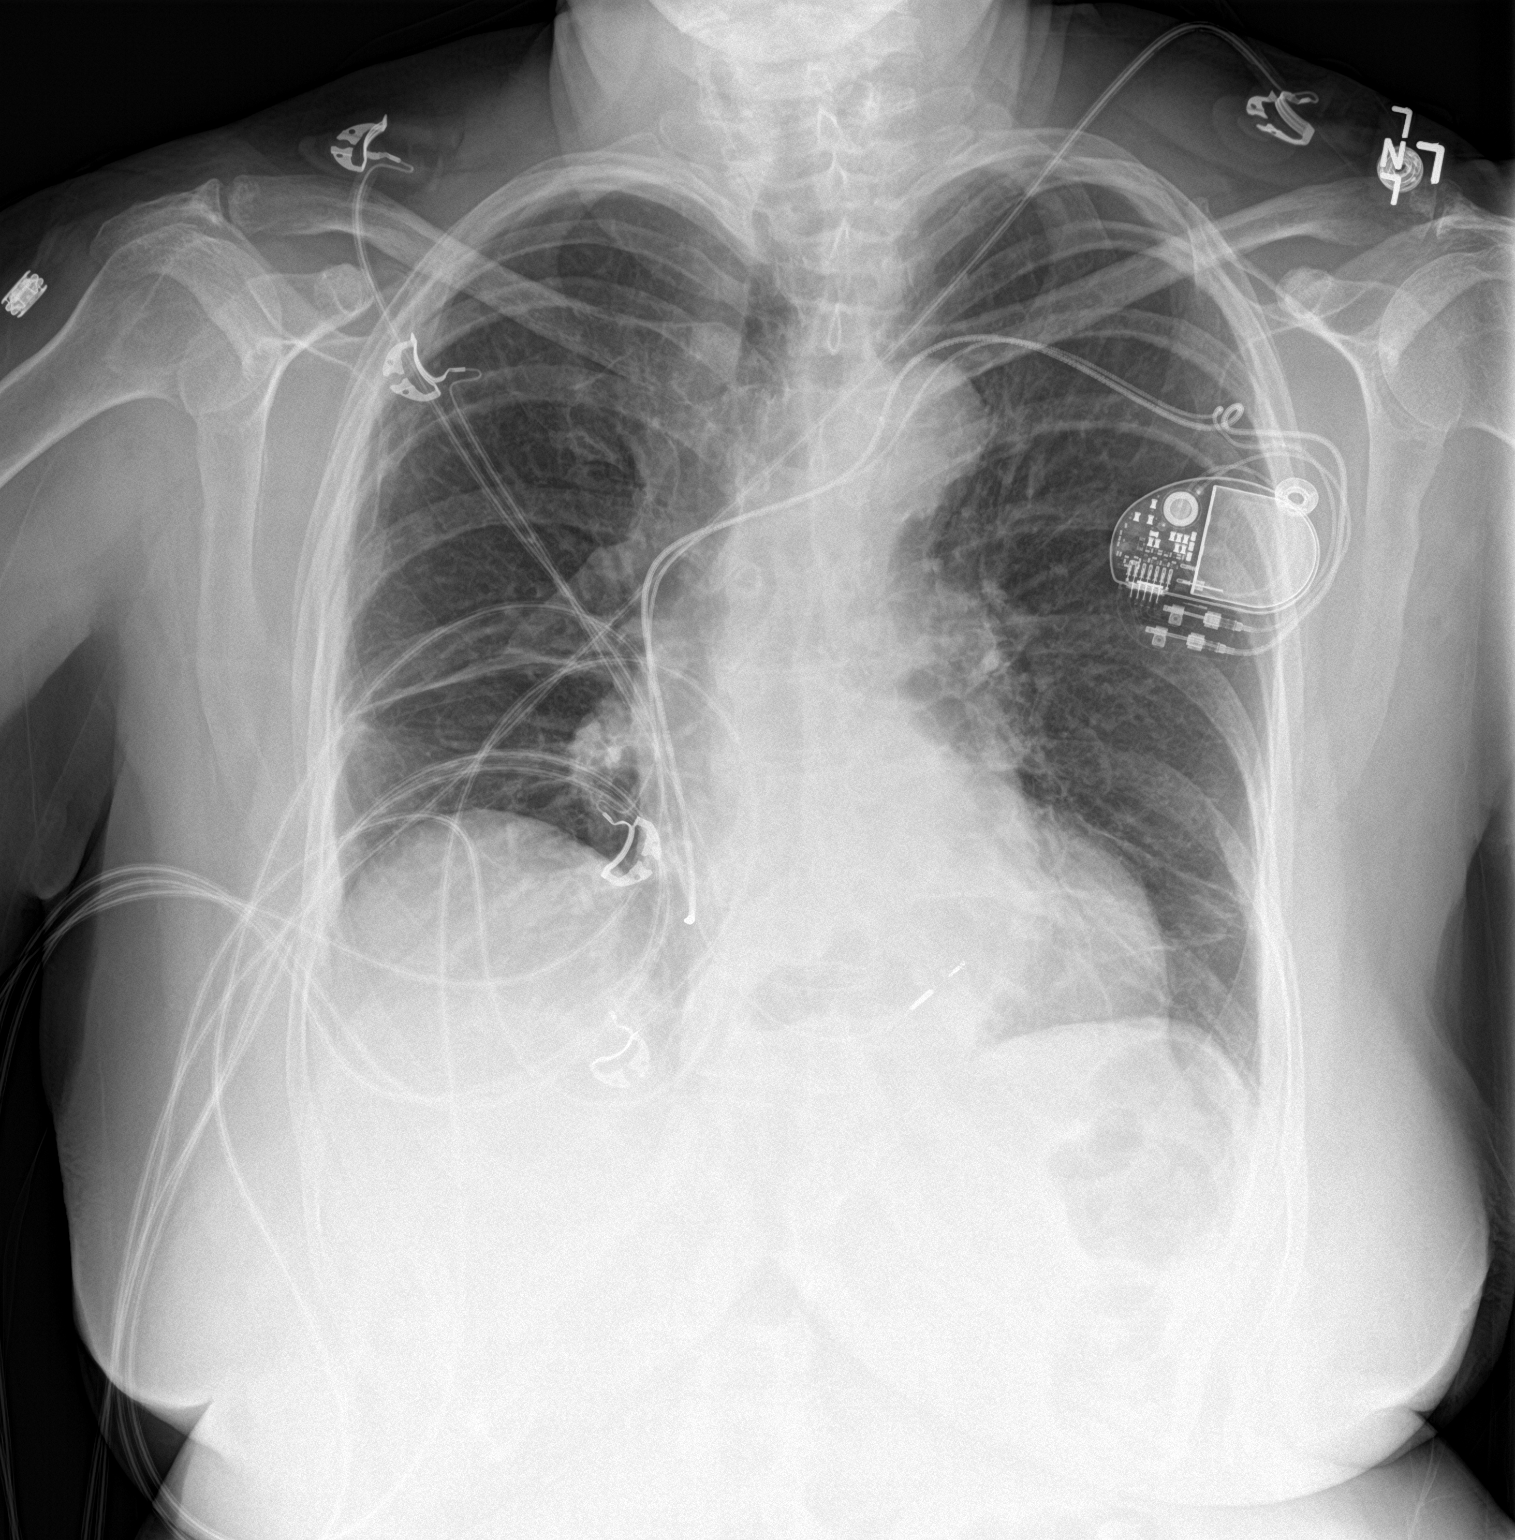

[chest lat]
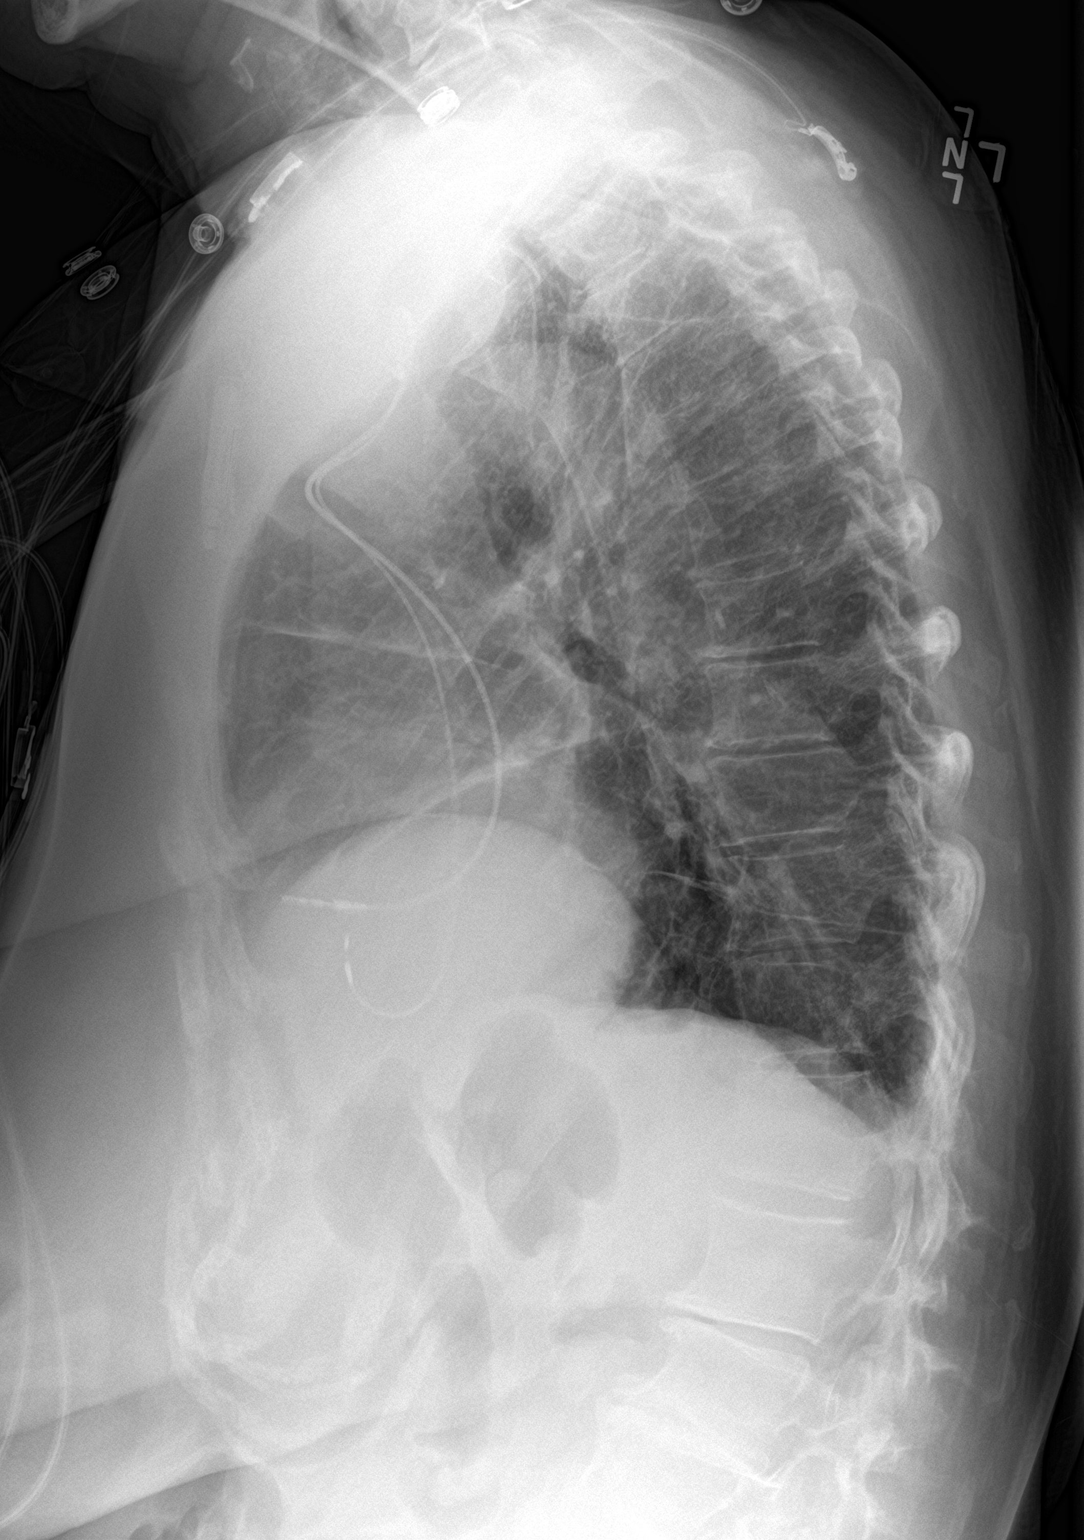

[2 of 2 positions shown; findings below may reference images not displayed]

FINDINGS: New dual lead transvenous pacemaker seen in appropriate position. No
pneumothorax visualized.

Mild cardiomegaly stable. Small hiatal hernia again noted. Aortic
atherosclerosis. Stable elevation right hemidiaphragm. Mild scarring
in both lower lung zones is unchanged. No evidence of pulmonary
infiltrate or edema.
IMPRESSION: New dual lead transvenous pacemaker in appropriate position. No
pneumothorax visualized.

Stable mild cardiomegaly, bibasilar scarring, and small hiatal
hernia.

## 2020-05-15 ENCOUNTER — Ambulatory Visit (INDEPENDENT_AMBULATORY_CARE_PROVIDER_SITE_OTHER): Payer: Medicare Other | Admitting: *Deleted

## 2020-05-15 DIAGNOSIS — I441 Atrioventricular block, second degree: Secondary | ICD-10-CM

## 2020-05-15 LAB — CUP PACEART REMOTE DEVICE CHECK
Battery Remaining Longevity: 98 mo
Battery Remaining Percentage: 95.5 %
Battery Voltage: 3.01 V
Brady Statistic AP VP Percent: 41 %
Brady Statistic AP VS Percent: 1 %
Brady Statistic AS VP Percent: 58 %
Brady Statistic AS VS Percent: 1 %
Brady Statistic RA Percent Paced: 41 %
Brady Statistic RV Percent Paced: 99 %
Date Time Interrogation Session: 20210630022740
Implantable Lead Implant Date: 20200930
Implantable Lead Implant Date: 20200930
Implantable Lead Location: 753859
Implantable Lead Location: 753860
Implantable Lead Model: 3830
Implantable Lead Model: 5076
Implantable Pulse Generator Implant Date: 20200930
Lead Channel Impedance Value: 460 Ohm
Lead Channel Impedance Value: 650 Ohm
Lead Channel Pacing Threshold Amplitude: 0.5 V
Lead Channel Pacing Threshold Amplitude: 0.75 V
Lead Channel Pacing Threshold Pulse Width: 0.4 ms
Lead Channel Pacing Threshold Pulse Width: 1 ms
Lead Channel Sensing Intrinsic Amplitude: 12 mV
Lead Channel Sensing Intrinsic Amplitude: 2.5 mV
Lead Channel Setting Pacing Amplitude: 2 V
Lead Channel Setting Pacing Amplitude: 2.5 V
Lead Channel Setting Pacing Pulse Width: 1 ms
Lead Channel Setting Sensing Sensitivity: 2 mV
Pulse Gen Model: 2272
Pulse Gen Serial Number: 9156742

## 2020-05-16 NOTE — Progress Notes (Signed)
Remote pacemaker transmission.   

## 2020-06-04 ENCOUNTER — Other Ambulatory Visit: Payer: Self-pay | Admitting: Family Medicine

## 2020-06-04 DIAGNOSIS — I10 Essential (primary) hypertension: Secondary | ICD-10-CM

## 2020-06-10 NOTE — Progress Notes (Signed)
Subjective:   Wendy Velazquez is a 79 y.o. female who presents for Medicare Annual (Subsequent) preventive examination.  Review of Systems     Cardiac Risk Factors include: advanced age (>91men, >83 women);dyslipidemia     Objective:    Today's Vitals   06/11/20 0906  BP: (!) 160/90  Pulse: 61  Temp: 97.6 F (36.4 C)  TempSrc: Oral  SpO2: 97%  Weight: 170 lb 12.8 oz (77.5 kg)  Height: 5\' 2"  (1.575 m)   Body mass index is 31.24 kg/m.  Advanced Directives 06/11/2020 12/15/2019 08/16/2019 08/15/2019 06/06/2019 05/05/2018 05/04/2017  Does Patient Have a Medical Advance Directive? Yes Yes Yes Yes Yes Yes Yes  Type of Paramedic of [place];Living will [place];Living will Healthcare Power of [place];Living will [place];Living will [place];Living will  Does patient want to make changes to medical advance directive? No - Patient declined - No - Patient declined - No - Patient declined - -  Copy of [place] in Chart? No - copy requested - No - copy requested - No - copy requested No - copy requested No - copy requested    Current Medications (verified) Outpatient Encounter Medications as of 06/11/2020  Medication Sig  . acetaminophen (TYLENOL ARTHRITIS PAIN) 650 MG CR tablet Take 650 mg by mouth 2 (two) times daily.   Marland Kitchen amLODipine (NORVASC) 5 MG tablet TAKE 1 TABLET DAILY  . ARTIFICIAL TEAR SOLUTION OP Place 1 drop into both eyes daily as needed (irritation).  . Cholecalciferol (VITAMIN D-1000 MAX ST) 25 MCG (1000 UT) tablet Take 1,000 Units by mouth daily.   Marland Kitchen docusate sodium (COLACE) 100 MG capsule Take 200 mg by mouth daily as needed for mild constipation.  Marland Kitchen estradiol (ESTRACE) 0.5 MG tablet Take 1 tablet (0.5 mg total) by mouth daily. (Patient taking differently: Take 0.25 mg by mouth See admin instructions. Take 0.25 mg daily for 25 days, skip 5  days then repeat)  . guaifenesin (HUMIBID E) 400 MG TABS tablet Take 400 mg by mouth at bedtime.  . Homeopathic Products [facility] SINUS RELIEF NA) Place 1 spray into the nose daily as needed (congestion).  . Lactobacillus (AZO COMPLETE FEMININE BALANCE) CAPS Take 1 capsule by mouth daily.  . medroxyPROGESTERone (PROVERA) 5 MG tablet 1 tab po qd 10 days a month as directed.  Brand name necessary. (Patient taking differently: Take 2.5 mg by mouth See admin instructions. Take 2.5 mg daily on the last 10 days of the cycle Brand name necessary.)  . Misc Natural Products (GLUCOSAMINE CHOND COMPLEX/MSM) TABS Take 1 tablet by mouth 2 (two) times daily.  . propranolol (INDERAL) 10 MG tablet Take 1 tablet (10 mg total) by mouth 2 (two) times daily.  . sodium chloride (OCEAN) 0.65 % SOLN nasal spray Place 1 spray into both nostrils at bedtime as needed for congestion.  Marland Kitchen spironolactone (ALDACTONE) 25 MG tablet 1/2 tab po qd (Patient taking differently: Take 12.5 mg by mouth daily. )  . SYNTHROID 75 MCG tablet Take 1 tablet (75 mcg total) by mouth daily. (Patient taking differently: Take 75 mcg by mouth daily before breakfast. )  . [DISCONTINUED] betamethasone dipropionate (DIPROLENE) 0.05 % cream Apply topically 2 (two) times daily. Use thin layer on affected skin areas (Patient not taking: Reported on 12/05/2019)  . [DISCONTINUED] HYDROcodone-acetaminophen (NORCO/VICODIN) 5-325 MG tablet Take 1-2 tablets by mouth every 4 (four) hours as needed for  severe pain ((score 7 to 10)).  . [DISCONTINUED] Menthol-Methyl Salicylate (SALONPAS PAIN RELIEF PATCH) PTCH Apply 1 patch topically daily.  . [DISCONTINUED] methocarbamol (ROBAXIN) 500 MG tablet Take 1 tablet (500 mg total) by mouth every 8 (eight) hours as needed for muscle spasms.   No facility-administered encounter medications on file as of 06/11/2020.    Allergies (verified) Antihistamines, diphenhydramine-type; Codeine; Hydrocodone-acetaminophen; Lactose  intolerance (gi); Tramadol hcl; and Gabapentin   History: Past Medical History:  Diagnosis Date  . Arthritis   . Complication of anesthesia   . HEARING LOSS, BILATERAL 03/02/2008   Qualifier: Diagnosis of  By: Jerold Coombe    . Hx of cyst of breast 07/21/2012  . Hypertension   . Hypothyroidism 03/02/2008   Qualifier: Diagnosis of  By: Jerold Coombe    . Melanoma ([place])   . Mobitz type II atrioventricular block 08/15/2019  . MRSA (methicillin resistant staph aureus) culture positive   . Osteopenia   . PONV (postoperative nausea and vomiting)   . Rapid heart beat    benigh  . Transitional cell carcinoma ([place])   . Vitamin D deficiency 09/12/2019   Past Surgical History:  Procedure Laterality Date  . ABDOMINAL HYSTERECTOMY  1987   partial  . ANTERIOR LAT LUMBAR FUSION Left 12/19/2019   Procedure: Left Lumbar Two-Three, Lumbar Three-Four, Lumbar Four-Five Anterolateral lumbar Interbody Fusion;  Surgeon: Erline Levine, MD;  Location: [facility];  Service: Neurosurgery;  Laterality: Left;  Left Lumbar 2-3 Lumbar 3-4 Lumbar 4-5 Anterolateral lumbar interbody fusion with percutaneous pedicle screws  . BACK SURGERY     08/02/2018  . CATARACT EXTRACTION  2004  . EYE SURGERY     cataract b/l  . LUMBAR PERCUTANEOUS PEDICLE SCREW 3 LEVEL N/A 12/19/2019   Procedure: Lumbar Two-Three, Lumbar Three-Four, Lumbar Four-Five Percutaneous Pedicle Screw Placement;  Surgeon: Erline Levine, MD;  Location: [facility];  Service: Neurosurgery;  Laterality: N/A;  . [place]   left leg  . MELANOMA EXCISION  06-24-09   r foot  . PACEMAKER IMPLANT N/A 08/16/2019   Procedure: PACEMAKER IMPLANT;  Surgeon: Evans Lance, MD;  Location: [facility];  Service: Cardiovascular;  Laterality: N/A;  . SKIN CANCER EXCISION  1999   bladder transitional cell  . teeth implants    . TONSILLECTOMY     Family History  Problem Relation Age of Onset  . Diabetes Mother   . Alzheimer's disease Mother   .  Dementia Mother   . Stroke Father   . Hypertension Father    Social History   Socioeconomic History  . Marital status: Widowed    Spouse name: Not on file  . Number of children: Not on file  . Years of education: Not on file  . Highest education level: Not on file  Occupational History  . Occupation: Product/process development scientist: [employer]    Comment: 4 days a week  Tobacco Use  . Smoking status: Former Smoker    Packs/day: 0.30    Years: 15.00    Pack years: 4.50    Quit date: 03/15/1981    Years since quitting: 39.2  . Smokeless tobacco: Never Used  Vaping Use  . Vaping Use: Never used  Substance and Sexual Activity  . Alcohol use: Yes    Alcohol/week: 1.0 - 2.0 standard drink    Types: 1 - 2 Glasses of wine per week  . Drug use: No  . Sexual activity:  Not Currently    Partners: Male  Other Topics Concern  . Not on file  Social History Narrative   Exercise--- dancing   Social Determinants of Health   Financial Resource Strain: Low Risk   . Difficulty of Paying Living Expenses: Not hard at all  Food Insecurity: No Food Insecurity  . Worried About Charity fundraiser in the Last Year: Never true  . Ran Out of Food in the Last Year: Never true  Transportation Needs: No Transportation Needs  . Lack of Transportation (Medical): No  . Lack of Transportation (Non-Medical): No  Physical Activity:   . Days of Exercise per Week:   . Minutes of Exercise per Session:   Stress:   . Feeling of Stress :   Social Connections:   . Frequency of Communication with Friends and Family:   . Frequency of Social Gatherings with Friends and Family:   . Attends Religious Services:   . Active Member of Clubs or Organizations:   . Attends Archivist Meetings:   Marland Kitchen Marital Status:     Tobacco Counseling Counseling given: Not Answered   Clinical Intake: Pain : No/denies pain     Activities of Daily Living In your present state of health, do you have any  difficulty performing the following activities: 06/11/2020 12/15/2019  Hearing? N Y  Vision? N N  Difficulty concentrating or making decisions? N N  Walking or climbing stairs? N Y  Comment - -  Dressing or bathing? N N  Doing errands, shopping? N N  Preparing Food and eating ? N -  Using the Toilet? N -  In the past six months, have you accidently leaked urine? N -  Do you have problems with loss of bowel control? N -  Managing your Medications? N -  Managing your Finances? N -  Housekeeping or managing your Housekeeping? N -  Some recent data might be hidden    Patient Care Team: Carollee Herter, Alferd Apa, DO as PCP - General Luberta Mutter, MD as Consulting Physician (Ophthalmology) Myrlene Broker, MD as Attending Physician (Urology) Rolm Bookbinder, MD as Consulting Physician (Dermatology) Myrle Sheng, MD as Referring Physician (Neurosurgery)  Indicate any recent Medical Services you may have received from other than [facility] providers in the past year (date may be approximate).     Assessment:   This is a routine wellness examination for Wendy Velazquez.  Dietary issues and exercise activities discussed: Current Exercise Habits: The patient does not participate in regular exercise at present, Exercise limited by: None identified Diet (meal preparation, eat out, water intake, caffeinated beverages, dairy products, fruits and vegetables): well balanced     Goals    . Maintain current health      Depression Screen PHQ 2/9 Scores 06/11/2020 06/06/2019 05/05/2018 05/04/2017 04/21/2016 08/16/2015 04/18/2015  PHQ - 2 Score 0 0 0 0 0 0 0  Exception Documentation - - - - - - -    Fall Risk Fall Risk  06/11/2020 06/06/2019 05/05/2018 05/04/2017 04/21/2016  Falls in the past year? 0 0 No No No  Number falls in past yr: 0 - - - -  Injury with Fall? 0 - - - -  Follow up Education provided;Falls prevention discussed - - - -   Lives alone in 1 story home. Any stairs in or around the home? No    If so, are there any without handrails? No  Home free of loose throw rugs in walkways, pet  beds, electrical cords, etc? Yes  Adequate lighting in your home to reduce risk of falls? Yes   ASSISTIVE DEVICES UTILIZED TO PREVENT FALLS:  Life alert? No  Use of a cane, walker or w/c? No  Grab bars in the bathroom? Yes  Shower chair or bench in shower? No  Elevated toilet seat or a handicapped toilet? Yes   Gait steady and fast without use of assistive device  Cognitive Function: Ad8 score reviewed for issues:  Issues making decisions:no  Less interest in hobbies / activities:no  Repeats questions, stories (family complaining):no  Trouble using ordinary gadgets (microwave, computer, phone):no  Forgets the month or year: no  Mismanaging finances: no  Remembering appts:no  Daily problems with thinking and/or memory:no Ad8 score is=0     MMSE - Mini Mental State Exam 05/04/2017 04/21/2016  Orientation to time 5 5  Orientation to Place 5 5  Registration 3 3  Attention/ Calculation 5 5  Recall 3 3  Language- name 2 objects 2 2  Language- repeat 1 1  Language- follow 3 step command 3 3  Language- read & follow direction 1 1  Write a sentence 1 1  Copy design 1 1  Total score 30 30        Immunizations Immunization History  Administered Date(s) Administered  . DTaP 06/05/2013  . Fluad Quad(high Dose 65+) 07/11/2019  . Influenza Whole 09/12/2008  . Influenza, High Dose Seasonal PF 08/29/2014, 07/12/2018  . Influenza-Unspecified 11/30/2012, 09/16/2013, 08/17/2016  . PFIZER SARS-COV-2 Vaccination 01/18/2020, 02/14/2020  . PPD Test 02/11/2016, 02/18/2016  . Pneumococcal Conjugate-13 04/21/2016  . Pneumococcal Polysaccharide-23 03/02/2008  . Td 11/29/2002  . Tdap 06/05/2013  . Zoster 03/02/2008  . Zoster Recombinat (Shingrix) 10/03/2019, 03/12/2020    TDAP status: Up to date Flu Vaccine status: Up to date Pneumococcal vaccine status: Up to date Covid-19 vaccine  status: Completed vaccines  Qualifies for Shingles Vaccine? Yes   Zostavax completed Yes   Shingrix Completed?: Yes  Screening Tests Health Maintenance  Topic Date Due  . Hepatitis C Screening  Never done  . MAMMOGRAM  02/13/2015  . INFLUENZA VACCINE  06/16/2020  . TETANUS/TDAP  06/06/2023  . DEXA SCAN  Completed  . COVID-19 Vaccine  Completed  . PNA vac Low Risk Adult  Completed    Health Maintenance  Health Maintenance Due  Topic Date Due  . Hepatitis C Screening  Never done  . MAMMOGRAM  02/13/2015    Colorectal cancer screening: Completed Cologuard 05/23/17. Repeat every 3 years Cologuard ordered today. Declines mammogram. Bone Density status: Completed 06/15/19. Results reflect: Bone density results: OSTEOPENIA. Repeat every 2 years.  Lung Cancer Screening: (Low Dose CT Chest recommended if Age 65-80 years, 30 pack-year currently smoking OR have quit w/in 15years.) does not qualify.    Additional Screening:  Vision Screening: Recommended annual ophthalmology exams for early detection of glaucoma and other disorders of the eye. Is the patient up to date with their annual eye exam?  Yes  Who is the provider or what is the name of the office in which the patient attends annual eye exams? Dr.McCuen   Dental Screening: Recommended annual dental exams for proper oral hygiene  Community Resource Referral / Chronic Care Management: CRR required this visit?  No   CCM required this visit?  No      Plan:    Please schedule your next medicare wellness visit with me in 1 yr.  Continue to eat heart healthy diet (full of  fruits, vegetables, whole grains, lean protein, water--limit salt, fat, and sugar intake) and increase physical activity as tolerated.  Continue doing brain stimulating activities (puzzles, reading, adult coloring books, staying active) to keep memory sharp.   I have ordered your Cologuard today.  I have personally reviewed and noted the following in the  patient's chart:   . Medical and social history . Use of alcohol, tobacco or illicit drugs  . Current medications and supplements . Functional ability and status . Nutritional status . Physical activity . Advanced directives . List of other physicians . Hospitalizations, surgeries, and ER visits in previous 12 months . Vitals . Screenings to include cognitive, depression, and falls . Referrals and appointments  In addition, I have reviewed and discussed with patient certain preventive protocols, quality metrics, and best practice recommendations. A written personalized care plan for preventive services as well as general preventive health recommendations were provided to patient.     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  Pt still enjoys ballroom dancing a couple times per month.

## 2020-06-11 ENCOUNTER — Ambulatory Visit (INDEPENDENT_AMBULATORY_CARE_PROVIDER_SITE_OTHER): Payer: Medicare Other | Admitting: Family Medicine

## 2020-06-11 ENCOUNTER — Encounter: Payer: Self-pay | Admitting: *Deleted

## 2020-06-11 ENCOUNTER — Ambulatory Visit (INDEPENDENT_AMBULATORY_CARE_PROVIDER_SITE_OTHER): Payer: Medicare Other | Admitting: *Deleted

## 2020-06-11 ENCOUNTER — Encounter: Payer: Self-pay | Admitting: Family Medicine

## 2020-06-11 ENCOUNTER — Other Ambulatory Visit: Payer: Self-pay

## 2020-06-11 VITALS — BP 160/90 | HR 61 | Temp 97.6°F | Ht 62.0 in | Wt 170.8 lb

## 2020-06-11 VITALS — BP 142/86 | HR 63 | Temp 98.0°F | Resp 16 | Ht 62.0 in | Wt 170.0 lb

## 2020-06-11 DIAGNOSIS — E785 Hyperlipidemia, unspecified: Secondary | ICD-10-CM

## 2020-06-11 DIAGNOSIS — E559 Vitamin D deficiency, unspecified: Secondary | ICD-10-CM | POA: Diagnosis not present

## 2020-06-11 DIAGNOSIS — I10 Essential (primary) hypertension: Secondary | ICD-10-CM | POA: Diagnosis not present

## 2020-06-11 DIAGNOSIS — Z0000 Encounter for general adult medical examination without abnormal findings: Secondary | ICD-10-CM | POA: Diagnosis not present

## 2020-06-11 DIAGNOSIS — E039 Hypothyroidism, unspecified: Secondary | ICD-10-CM

## 2020-06-11 DIAGNOSIS — E782 Mixed hyperlipidemia: Secondary | ICD-10-CM | POA: Diagnosis not present

## 2020-06-11 DIAGNOSIS — M8949 Other hypertrophic osteoarthropathy, multiple sites: Secondary | ICD-10-CM | POA: Diagnosis not present

## 2020-06-11 DIAGNOSIS — Z1211 Encounter for screening for malignant neoplasm of colon: Secondary | ICD-10-CM

## 2020-06-11 DIAGNOSIS — Z780 Asymptomatic menopausal state: Secondary | ICD-10-CM

## 2020-06-11 DIAGNOSIS — M159 Polyosteoarthritis, unspecified: Secondary | ICD-10-CM

## 2020-06-11 LAB — VITAMIN D 25 HYDROXY (VIT D DEFICIENCY, FRACTURES): VITD: 23.5 ng/mL — ABNORMAL LOW (ref 30.00–100.00)

## 2020-06-11 LAB — COMPREHENSIVE METABOLIC PANEL
ALT: 14 U/L (ref 0–35)
AST: 16 U/L (ref 0–37)
Albumin: 4.7 g/dL (ref 3.5–5.2)
Alkaline Phosphatase: 85 U/L (ref 39–117)
BUN: 23 mg/dL (ref 6–23)
CO2: 31 mEq/L (ref 19–32)
Calcium: 10 mg/dL (ref 8.4–10.5)
Chloride: 101 mEq/L (ref 96–112)
Creatinine, Ser: 0.98 mg/dL (ref 0.40–1.20)
GFR: 54.89 mL/min — ABNORMAL LOW (ref >60.00)
Glucose, Bld: 107 mg/dL — ABNORMAL HIGH (ref 70–99)
Potassium: 4.1 mEq/L (ref 3.5–5.1)
Sodium: 138 mEq/L (ref 135–145)
Total Bilirubin: 1.1 mg/dL (ref 0.2–1.2)
Total Protein: 6.7 g/dL (ref 6.0–8.3)

## 2020-06-11 LAB — LIPID PANEL
Cholesterol: 159 mg/dL (ref 0–200)
HDL: 51.8 mg/dL (ref >39.00)
LDL Cholesterol: 76 mg/dL (ref 0–99)
NonHDL: 107.51
Total CHOL/HDL Ratio: 3
Triglycerides: 157 mg/dL — ABNORMAL HIGH (ref 0.0–149.0)
VLDL: 31.4 mg/dL (ref 0.0–40.0)

## 2020-06-11 LAB — TSH: TSH: 1.08 u[IU]/mL (ref 0.35–4.50)

## 2020-06-11 MED ORDER — SYNTHROID 75 MCG PO TABS: 75.0000 ug | ORAL_TABLET | Freq: Every day | ORAL | 3 refills | Status: DC

## 2020-06-11 MED ORDER — MEDROXYPROGESTERONE ACETATE 5 MG PO TABS: ORAL_TABLET | ORAL | 3 refills | Status: DC

## 2020-06-11 MED ORDER — MELOXICAM 15 MG PO TABS: ORAL_TABLET | ORAL | 1 refills | Status: DC

## 2020-06-11 MED ORDER — SPIRONOLACTONE 25 MG PO TABS: ORAL_TABLET | ORAL | 3 refills | Status: DC

## 2020-06-11 MED ORDER — PROPRANOLOL HCL 10 MG PO TABS: 10.0000 mg | ORAL_TABLET | Freq: Two times a day (BID) | ORAL | 3 refills | Status: DC

## 2020-06-11 MED ORDER — ESTRADIOL 0.5 MG PO TABS: 0.5000 mg | ORAL_TABLET | Freq: Every day | ORAL | 1 refills | Status: DC

## 2020-06-11 NOTE — Patient Instructions (Signed)
DASH Eating Plan DASH stands for "Dietary Approaches to Stop Hypertension." The DASH eating plan is a healthy eating plan that has been shown to reduce high blood pressure (hypertension). It may also reduce your risk for type 2 diabetes, heart disease, and stroke. The DASH eating plan may also help with weight loss. What are tips for following this plan?  General guidelines  Avoid eating more than 2,300 mg (milligrams) of salt (sodium) a day. If you have hypertension, you may need to reduce your sodium intake to 1,500 mg a day.  Limit alcohol intake to no more than 1 drink a day for nonpregnant women and 2 drinks a day for men. One drink equals 12 oz of beer, 5 oz of wine, or 1 oz of hard liquor.  Work with your health care provider to maintain a healthy body weight or to lose weight. Ask what an ideal weight is for you.  Get at least 30 minutes of exercise that causes your heart to beat faster (aerobic exercise) most days of the week. Activities may include walking, swimming, or biking.  Work with your health care provider or diet and nutrition specialist (dietitian) to adjust your eating plan to your individual calorie needs. Reading food labels   Check food labels for the amount of sodium per serving. Choose foods with less than 5 percent of the Daily Value of sodium. Generally, foods with less than 300 mg of sodium per serving fit into this eating plan.  To find whole grains, look for the word "whole" as the first word in the ingredient list. Shopping  Buy products labeled as "low-sodium" or "no salt added."  Buy fresh foods. Avoid canned foods and premade or frozen meals. Cooking  Avoid adding salt when cooking. Use salt-free seasonings or herbs instead of table salt or sea salt. Check with your health care provider or pharmacist before using salt substitutes.  Do not fry foods. Cook foods using healthy methods such as baking, boiling, grilling, and broiling instead.  Cook with  heart-healthy oils, such as olive, canola, soybean, or sunflower oil. Meal planning  Eat a balanced diet that includes: ? 5 or more servings of fruits and vegetables each day. At each meal, try to fill half of your plate with fruits and vegetables. ? Up to 6-8 servings of whole grains each day. ? Less than 6 oz of lean meat, poultry, or fish each day. A 3-oz serving of meat is about the same size as a deck of cards. One egg equals 1 oz. ? 2 servings of low-fat dairy each day. ? A serving of nuts, seeds, or beans 5 times each week. ? Heart-healthy fats. Healthy fats called Omega-3 fatty acids are found in foods such as flaxseeds and coldwater fish, like sardines, salmon, and mackerel.  Limit how much you eat of the following: ? Canned or prepackaged foods. ? Food that is high in trans fat, such as fried foods. ? Food that is high in saturated fat, such as fatty meat. ? Sweets, desserts, sugary drinks, and other foods with added sugar. ? Full-fat dairy products.  Do not salt foods before eating.  Try to eat at least 2 vegetarian meals each week.  Eat more home-cooked food and less restaurant, buffet, and fast food.  When eating at a restaurant, ask that your food be prepared with less salt or no salt, if possible. What foods are recommended? The items listed may not be a complete list. Talk with your dietitian about   what dietary choices are best for you. Grains Whole-grain or whole-wheat bread. Whole-grain or whole-wheat pasta. Brown rice. Oatmeal. Quinoa. Bulgur. Whole-grain and low-sodium cereals. Pita bread. Low-fat, low-sodium crackers. Whole-wheat flour tortillas. Vegetables Fresh or frozen vegetables (raw, steamed, roasted, or grilled). Low-sodium or reduced-sodium tomato and vegetable juice. Low-sodium or reduced-sodium tomato sauce and tomato paste. Low-sodium or reduced-sodium canned vegetables. Fruits All fresh, dried, or frozen fruit. Canned fruit in natural juice (without  added sugar). Meat and other protein foods Skinless chicken or turkey. Ground chicken or turkey. Pork with fat trimmed off. Fish and seafood. Egg whites. Dried beans, peas, or lentils. Unsalted nuts, nut butters, and seeds. Unsalted canned beans. Lean cuts of beef with fat trimmed off. Low-sodium, lean deli meat. Dairy Low-fat (1%) or fat-free (skim) milk. Fat-free, low-fat, or reduced-fat cheeses. Nonfat, low-sodium ricotta or cottage cheese. Low-fat or nonfat yogurt. Low-fat, low-sodium cheese. Fats and oils Soft margarine without trans fats. Vegetable oil. Low-fat, reduced-fat, or light mayonnaise and salad dressings (reduced-sodium). Canola, safflower, olive, soybean, and sunflower oils. Avocado. Seasoning and other foods Herbs. Spices. Seasoning mixes without salt. Unsalted popcorn and pretzels. Fat-free sweets. What foods are not recommended? The items listed may not be a complete list. Talk with your dietitian about what dietary choices are best for you. Grains Baked goods made with fat, such as croissants, muffins, or some breads. Dry pasta or rice meal packs. Vegetables Creamed or fried vegetables. Vegetables in a cheese sauce. Regular canned vegetables (not low-sodium or reduced-sodium). Regular canned tomato sauce and paste (not low-sodium or reduced-sodium). Regular tomato and vegetable juice (not low-sodium or reduced-sodium). Pickles. Olives. Fruits Canned fruit in a light or heavy syrup. Fried fruit. Fruit in cream or butter sauce. Meat and other protein foods Fatty cuts of meat. Ribs. Fried meat. Bacon. Sausage. Bologna and other processed lunch meats. Salami. Fatback. Hotdogs. Bratwurst. Salted nuts and seeds. Canned beans with added salt. Canned or smoked fish. Whole eggs or egg yolks. Chicken or turkey with skin. Dairy Whole or 2% milk, cream, and half-and-half. Whole or full-fat cream cheese. Whole-fat or sweetened yogurt. Full-fat cheese. Nondairy creamers. Whipped toppings.  Processed cheese and cheese spreads. Fats and oils Butter. Stick margarine. Lard. Shortening. Ghee. Bacon fat. Tropical oils, such as coconut, palm kernel, or palm oil. Seasoning and other foods Salted popcorn and pretzels. Onion salt, garlic salt, seasoned salt, table salt, and sea salt. Worcestershire sauce. Tartar sauce. Barbecue sauce. Teriyaki sauce. Soy sauce, including reduced-sodium. Steak sauce. Canned and packaged gravies. Fish sauce. Oyster sauce. Cocktail sauce. Horseradish that you find on the shelf. Ketchup. Mustard. Meat flavorings and tenderizers. Bouillon cubes. Hot sauce and Tabasco sauce. Premade or packaged marinades. Premade or packaged taco seasonings. Relishes. Regular salad dressings. Where to find more information:  National Heart, Lung, and Blood Institute: www.nhlbi.nih.gov  American Heart Association: www.heart.org Summary  The DASH eating plan is a healthy eating plan that has been shown to reduce high blood pressure (hypertension). It may also reduce your risk for type 2 diabetes, heart disease, and stroke.  With the DASH eating plan, you should limit salt (sodium) intake to 2,300 mg a day. If you have hypertension, you may need to reduce your sodium intake to 1,500 mg a day.  When on the DASH eating plan, aim to eat more fresh fruits and vegetables, whole grains, lean proteins, low-fat dairy, and heart-healthy fats.  Work with your health care provider or diet and nutrition specialist (dietitian) to adjust your eating plan to your   individual calorie needs. This information is not intended to replace advice given to you by your health care provider. Make sure you discuss any questions you have with your health care provider. Document Revised: 10/15/2017 Document Reviewed: 10/26/2016 Elsevier Patient Education  2020 [employer]vier Inc.  

## 2020-06-11 NOTE — Patient Instructions (Signed)
Please schedule your next medicare wellness visit with me in 1 yr.  Continue to eat heart healthy diet (full of fruits, vegetables, whole grains, lean protein, water--limit salt, fat, and sugar intake) and increase physical activity as tolerated.  Continue doing brain stimulating activities (puzzles, reading, adult coloring books, staying active) to keep memory sharp.   I have ordered your Cologuard today.   Wendy Velazquez , Thank you for taking time to come for your Medicare Wellness Visit. I appreciate your ongoing commitment to your health goals. Please review the following plan we discussed and let me know if I can assist you in the future.   These are the goals we discussed: Goals    . Maintain current health       This is a list of the screening recommended for you and due dates:  Health Maintenance  Topic Date Due  .  Hepatitis C: One time screening is recommended by Center for Disease Control  (CDC) for  adults born from [birth year] through 1964.   Never done  . Mammogram  06/11/2021*  . Flu Shot  06/16/2020  . Tetanus Vaccine  06/06/2023  . DEXA scan (bone density measurement)  Completed  . COVID-19 Vaccine  Completed  . Pneumonia vaccines  Completed  *Topic was postponed. The date shown is not the original due date.    Preventive Care 33 Years and Older, Female Preventive care refers to lifestyle choices and visits with your health care provider that can promote health and wellness. This includes:  A yearly physical exam. This is also called an annual well check.  Regular dental and eye exams.  Immunizations.  Screening for certain conditions.  Healthy lifestyle choices, such as diet and exercise. What can I expect for my preventive care visit? Physical exam Your health care provider will check:  Height and weight. These may be used to calculate body mass index (BMI), which is a measurement that tells if you are at a healthy weight.  Heart rate and blood  pressure.  Your skin for abnormal spots. Counseling Your health care provider may ask you questions about:  Alcohol, tobacco, and drug use.  Emotional well-being.  Home and relationship well-being.  Sexual activity.  Eating habits.  History of falls.  Memory and ability to understand (cognition).  Work and work Statistician.  Pregnancy and menstrual history. What immunizations do I need?  Influenza (flu) vaccine  This is recommended every year. Tetanus, diphtheria, and pertussis (Tdap) vaccine  You may need a Td booster every 10 years. Varicella (chickenpox) vaccine  You may need this vaccine if you have not already been vaccinated. Zoster (shingles) vaccine  You may need this after age 41. Pneumococcal conjugate (PCV13) vaccine  One dose is recommended after age 29. Pneumococcal polysaccharide (PPSV23) vaccine  One dose is recommended after age 34. Measles, mumps, and rubella (MMR) vaccine  You may need at least one dose of MMR if you were born in 1957 or later. You may also need a second dose. Meningococcal conjugate (MenACWY) vaccine  You may need this if you have certain conditions. Hepatitis A vaccine  You may need this if you have certain conditions or if you travel or work in places where you may be exposed to hepatitis A. Hepatitis B vaccine  You may need this if you have certain conditions or if you travel or work in places where you may be exposed to hepatitis B. Haemophilus influenzae type b (Hib) vaccine  You may need  this if you have certain conditions. You may receive vaccines as individual doses or as more than one vaccine together in one shot (combination vaccines). Talk with your health care provider about the risks and benefits of combination vaccines. What tests do I need? Blood tests  Lipid and cholesterol levels. These may be checked every 5 years, or more frequently depending on your overall health.  Hepatitis C test.  Hepatitis B  test. Screening  Lung cancer screening. You may have this screening every year starting at age 62 if you have a 30-pack-year history of smoking and currently smoke or have quit within the past 15 years.  Colorectal cancer screening. All adults should have this screening starting at age 32 and continuing until age 72. Your health care provider may recommend screening at age 8 if you are at increased risk. You will have tests every 1-10 years, depending on your results and the type of screening test.  Diabetes screening. This is done by checking your blood sugar (glucose) after you have not eaten for a while (fasting). You may have this done every 1-3 years.  Mammogram. This may be done every 1-2 years. Talk with your health care provider about how often you should have regular mammograms.  BRCA-related cancer screening. This may be done if you have a family history of breast, ovarian, tubal, or peritoneal cancers. Other tests  Sexually transmitted disease (STD) testing.  Bone density scan. This is done to screen for osteoporosis. You may have this done starting at age 50. Follow these instructions at home: Eating and drinking  Eat a diet that includes fresh fruits and vegetables, whole grains, lean protein, and low-fat dairy products. Limit your intake of foods with high amounts of sugar, saturated fats, and salt.  Take vitamin and mineral supplements as recommended by your health care provider.  Do not drink alcohol if your health care provider tells you not to drink.  If you drink alcohol: ? Limit how much you have to 0-1 drink a day. ? Be aware of how much alcohol is in your drink. In the [place] one drink equals one 12 oz bottle of beer (355 mL), one 5 oz glass of wine (148 mL), or one 1 oz glass of hard liquor (44 mL). Lifestyle  Take daily care of your teeth and gums.  Stay active. Exercise for at least 30 minutes on 5 or more days each week.  Do not use any products that  contain nicotine or tobacco, such as cigarettes, e-cigarettes, and chewing tobacco. If you need help quitting, ask your health care provider.  If you are sexually active, practice safe sex. Use a condom or other form of protection in order to prevent STIs (sexually transmitted infections).  Talk with your health care provider about taking a low-dose aspirin or statin. What's next?  Go to your health care provider once a year for a well check visit.  Ask your health care provider how often you should have your eyes and teeth checked.  Stay up to date on all vaccines. This information is not intended to replace advice given to you by your health care provider. Make sure you discuss any questions you have with your health care provider. Document Revised: 10/27/2018 Document Reviewed: 10/27/2018 Elsevier Patient Education  2020 [employer].

## 2020-06-11 NOTE — Assessment & Plan Note (Signed)
Encouraged heart healthy diet, increase exercise, avoid trans fats, consider a krill oil cap daily 

## 2020-06-11 NOTE — Assessment & Plan Note (Signed)
Check labs con't meds 

## 2020-06-11 NOTE — Progress Notes (Signed)
Patient ID: Wendy Velazquez, female    DOB: 1941-06-16  Age: 79 y.o. MRN: [id number]    Subjective:  Subjective  HPI MIKAILI FLIPPIN presents for f/u bp and cholesterol and thyroid.  Pt has no complaints.  She is doing well.   Review of Systems  Constitutional: Negative for appetite change, diaphoresis, fatigue and unexpected weight change.  Eyes: Negative for pain, redness and visual disturbance.  Respiratory: Negative for cough, chest tightness, shortness of breath and wheezing.   Cardiovascular: Negative for chest pain, palpitations and leg swelling.  Endocrine: Negative for cold intolerance, heat intolerance, polydipsia, polyphagia and polyuria.  Genitourinary: Negative for difficulty urinating, dysuria and frequency.  Neurological: Negative for dizziness, light-headedness, numbness and headaches.    History Past Medical History:  Diagnosis Date  . Arthritis   . Complication of anesthesia   . HEARING LOSS, BILATERAL 03/02/2008   Qualifier: Diagnosis of  By: Jerold Coombe    . Hx of cyst of breast 07/21/2012  . Hypertension   . Hypothyroidism 03/02/2008   Qualifier: Diagnosis of  By: Jerold Coombe    . Melanoma ([place])   . Mobitz type II atrioventricular block 08/15/2019  . MRSA (methicillin resistant staph aureus) culture positive   . Osteopenia   . PONV (postoperative nausea and vomiting)   . Rapid heart beat    benigh  . Transitional cell carcinoma ([place])   . Vitamin D deficiency 09/12/2019    She has a past surgical history that includes Tonsillectomy; Abdominal hysterectomy (1987); Melanoma excision (1977); Melanoma excision (06-24-09); Cataract extraction (2004); Skin cancer excision (1999); Eye surgery; teeth implants; Back surgery; PACEMAKER IMPLANT (N/A, 08/16/2019); Anterior lat lumbar fusion (Left, 12/19/2019); and Lumbar percutaneous pedicle screw 3 level (N/A, 12/19/2019).   Her family history includes Alzheimer's disease in her mother; Dementia in her mother;  Diabetes in her mother; Hypertension in her father; Stroke in her father.She reports that she quit smoking about 39 years ago. She has a 4.50 pack-year smoking history. She has never used smokeless tobacco. She reports current alcohol use of about 1.0 - 2.0 standard drink of alcohol per week. She reports that she does not use drugs.  Current Outpatient Medications on File Prior to Visit  Medication Sig Dispense Refill  . acetaminophen (TYLENOL ARTHRITIS PAIN) 650 MG CR tablet Take 650 mg by mouth 2 (two) times daily.     Marland Kitchen amLODipine (NORVASC) 5 MG tablet TAKE 1 TABLET DAILY 90 tablet 3  . ARTIFICIAL TEAR SOLUTION OP Place 1 drop into both eyes daily as needed (irritation).    . Cholecalciferol (VITAMIN D-1000 MAX ST) 25 MCG (1000 UT) tablet Take 1,000 Units by mouth daily.     Marland Kitchen docusate sodium (COLACE) 100 MG capsule Take 200 mg by mouth daily as needed for mild constipation.    Marland Kitchen guaifenesin (HUMIBID E) 400 MG TABS tablet Take 400 mg by mouth at bedtime.    . Homeopathic Products [facility] SINUS RELIEF NA) Place 1 spray into the nose daily as needed (congestion).    . Lactobacillus (AZO COMPLETE FEMININE BALANCE) CAPS Take 1 capsule by mouth daily.    . Misc Natural Products (GLUCOSAMINE CHOND COMPLEX/MSM) TABS Take 1 tablet by mouth 2 (two) times daily.    . sodium chloride (OCEAN) 0.65 % SOLN nasal spray Place 1 spray into both nostrils at bedtime as needed for congestion.     No current facility-administered medications on file prior to visit.     Objective:  Objective  Physical Exam Nursing note reviewed. Exam conducted with a chaperone present.  Constitutional:      Appearance: She is well-developed.  HENT:     Head: Normocephalic and atraumatic.  Eyes:     Conjunctiva/sclera: Conjunctivae normal.  Neck:     Thyroid: No thyromegaly.     Vascular: No carotid bruit or JVD.  Cardiovascular:     Rate and Rhythm: Normal rate and regular rhythm.     Heart sounds: Normal heart  sounds. No murmur heard.   Pulmonary:     Effort: Pulmonary effort is normal. No respiratory distress.     Breath sounds: Normal breath sounds. No wheezing or rales.  Chest:     Chest wall: No tenderness.  Musculoskeletal:     Cervical back: Normal range of motion and neck supple.  Neurological:     Mental Status: She is alert and oriented to person, place, and time.    BP (!) 142/86 (BP Location: Right Arm, Patient Position: Sitting, Cuff Size: Large)   Pulse 63   Temp 98 F (36.7 C) (Oral)   Resp 16   Ht 5\' 2"  (1.575 m)   Wt 170 lb (77.1 kg)   SpO2 97%   BMI 31.09 kg/m  Wt Readings from Last 3 Encounters:  06/11/20 170 lb (77.1 kg)  06/11/20 170 lb 12.8 oz (77.5 kg)  12/19/19 172 lb 13.5 oz (78.4 kg)     Lab Results  Component Value Date   WBC 6.2 12/15/2019   HGB 15.0 12/15/2019   HCT 47.8 (H) 12/15/2019   PLT 214 12/15/2019   GLUCOSE 118 (H) 12/15/2019   CHOL 185 09/12/2019   TRIG 237.0 (H) 09/12/2019   HDL 44.60 09/12/2019   LDLDIRECT 99.0 09/12/2019   LDLCALC 76 06/02/2019   ALT 14 09/12/2019   AST 16 09/12/2019   NA 141 12/15/2019   K 4.1 12/15/2019   CL 105 12/15/2019   CREATININE 1.00 12/15/2019   BUN 23 12/15/2019   CO2 30 12/15/2019   TSH 1.740 08/15/2019   MICROALBUR 2.0 (H) 04/18/2015    No results found.   Assessment & Plan:  Plan  I am having Natasja A. Hue start on meloxicam. I am also having her maintain her acetaminophen, Vitamin D-1000 Max St, guaifenesin, Glucosamine Chond Complex/MSM, AZO Complete Feminine Balance, ARTIFICIAL TEAR SOLUTION OP, docusate sodium, sodium chloride, Homeopathic Products (SIMILASAN SINUS RELIEF NA), amLODipine, estradiol, medroxyPROGESTERone, spironolactone, propranolol, and Synthroid.  Meds ordered this encounter  Medications  . meloxicam (MOBIC) 15 MG tablet    Sig: 1/2-1 po qd prn    Dispense:  90 tablet    Refill:  1  . estradiol (ESTRACE) 0.5 MG tablet    Sig: Take 1 tablet (0.5 mg total) by  mouth daily.    Dispense:  90 tablet    Refill:  1  . medroxyPROGESTERone (PROVERA) 5 MG tablet    Sig: 1 tab po qd 10 days a month as directed.  Brand name necessary.    Dispense:  90 tablet    Refill:  3    90 day supply  . spironolactone (ALDACTONE) 25 MG tablet    Sig: 1/2 tab po qd    Dispense:  45 tablet    Refill:  3  . propranolol (INDERAL) 10 MG tablet    Sig: Take 1 tablet (10 mg total) by mouth 2 (two) times daily.    Dispense:  180 tablet    Refill:  3  .  SYNTHROID 75 MCG tablet    Sig: Take 1 tablet (75 mcg total) by mouth daily.    Dispense:  90 tablet    Refill:  3    Problem List Items Addressed This Visit      Unprioritized   Essential hypertension    Well controlled, no changes to meds. Encouraged heart healthy diet such as the DASH diet and exercise as tolerated.       Relevant Medications   spironolactone (ALDACTONE) 25 MG tablet   propranolol (INDERAL) 10 MG tablet   Other Relevant Orders   Comprehensive metabolic panel   Hypothyroidism    Check labs  con't meds      Relevant Medications   propranolol (INDERAL) 10 MG tablet   SYNTHROID 75 MCG tablet   Other Relevant Orders   TSH   Menopause   Relevant Medications   estradiol (ESTRACE) 0.5 MG tablet   medroxyPROGESTERone (PROVERA) 5 MG tablet   Other Relevant Orders   Vitamin D (25 hydroxy)   Mixed hyperlipidemia    Encouraged heart healthy diet, increase exercise, avoid trans fats, consider a krill oil cap daily      Relevant Medications   spironolactone (ALDACTONE) 25 MG tablet   propranolol (INDERAL) 10 MG tablet   Primary osteoarthritis involving multiple joints - Primary   Relevant Medications   meloxicam (MOBIC) 15 MG tablet   Vitamin D deficiency   Relevant Orders   Vitamin D (25 hydroxy)    Other Visit Diagnoses    Dyslipidemia       Relevant Orders   Lipid panel   Comprehensive metabolic panel      Follow-up: Return in about 6 months (around 12/12/2020), or if  symptoms worsen or fail to improve, for hypertension, hyperlipidemia.  Ann Held, DO

## 2020-06-11 NOTE — Assessment & Plan Note (Signed)
Well controlled, no changes to meds. Encouraged heart healthy diet such as the DASH diet and exercise as tolerated.  °

## 2020-06-12 ENCOUNTER — Encounter: Payer: Self-pay | Admitting: Family Medicine

## 2020-06-12 NOTE — Telephone Encounter (Signed)
She can increase the otc vita d  Calcium was normal--- its in the cmp

## 2020-06-14 ENCOUNTER — Encounter: Payer: Self-pay | Admitting: Family Medicine

## 2020-06-25 DIAGNOSIS — Z1211 Encounter for screening for malignant neoplasm of colon: Secondary | ICD-10-CM | POA: Diagnosis not present

## 2020-06-25 LAB — COLOGUARD: Cologuard: NEGATIVE

## 2020-07-01 LAB — COLOGUARD: COLOGUARD: NEGATIVE

## 2020-07-25 IMAGING — RF DG LUMBAR SPINE 2-3V
1 series · 7 of 7 positions shown · non-contrast
Comparison: 10/02/2019

CLINICAL DATA: L2-L5 laminectomy and fusion

EXAM:
LUMBAR SPINE - 2-3 VIEW

[Series 1: run · 7 of 7 slices shown]
[im 1/7]
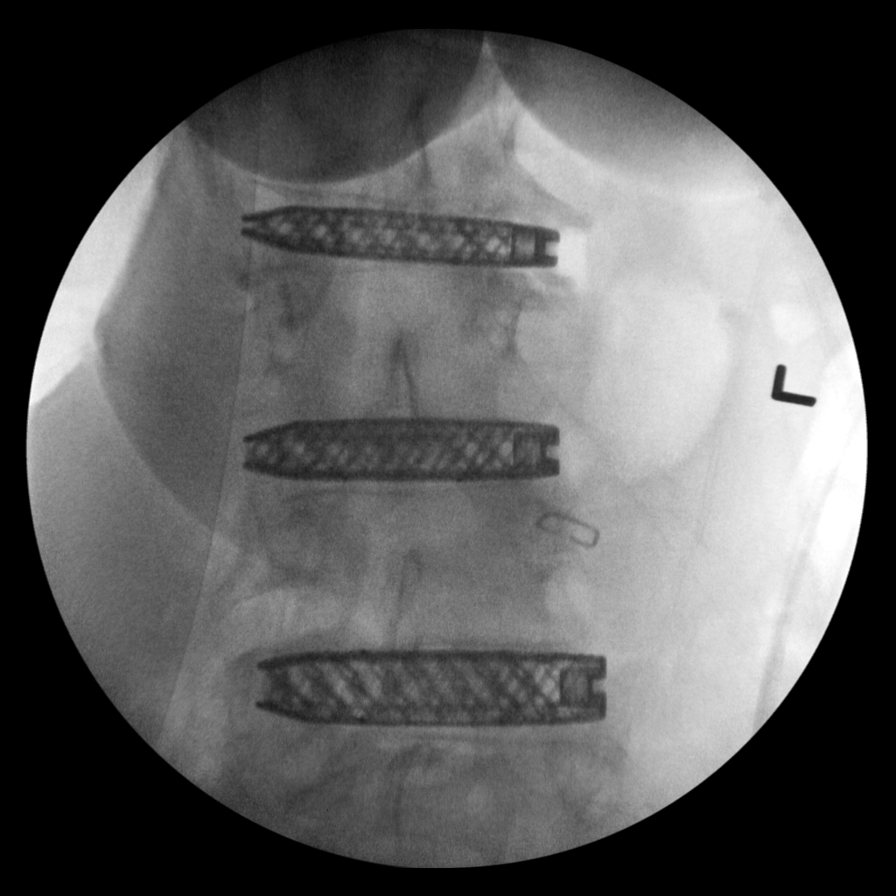
[im 2/7]
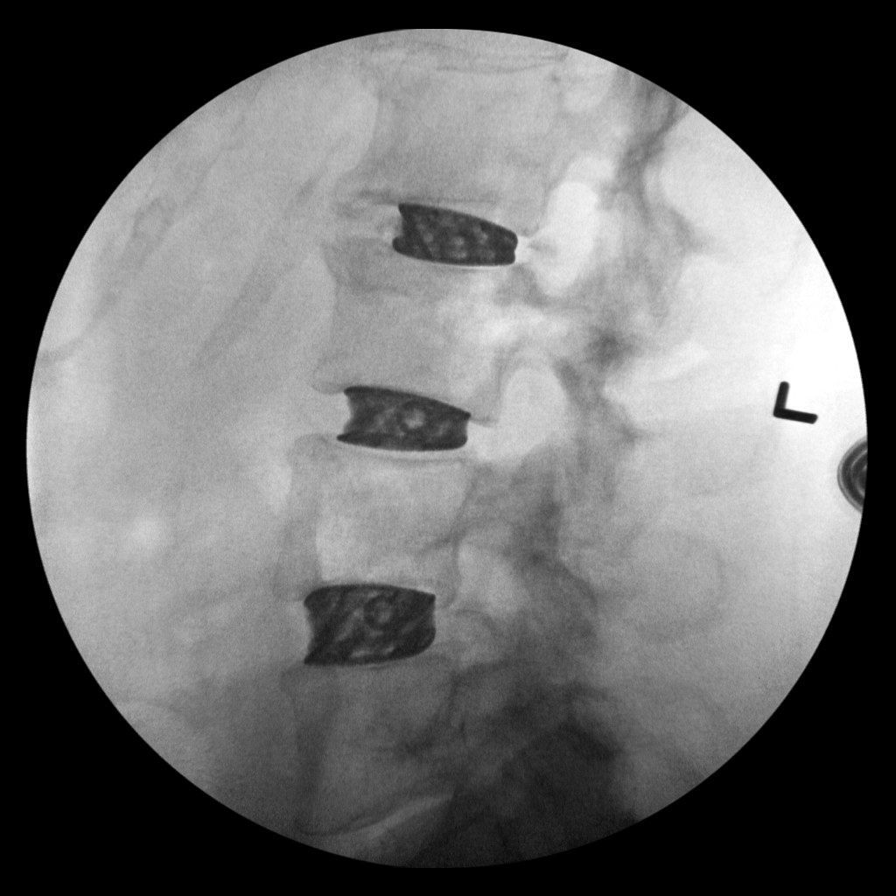
[im 3/7]
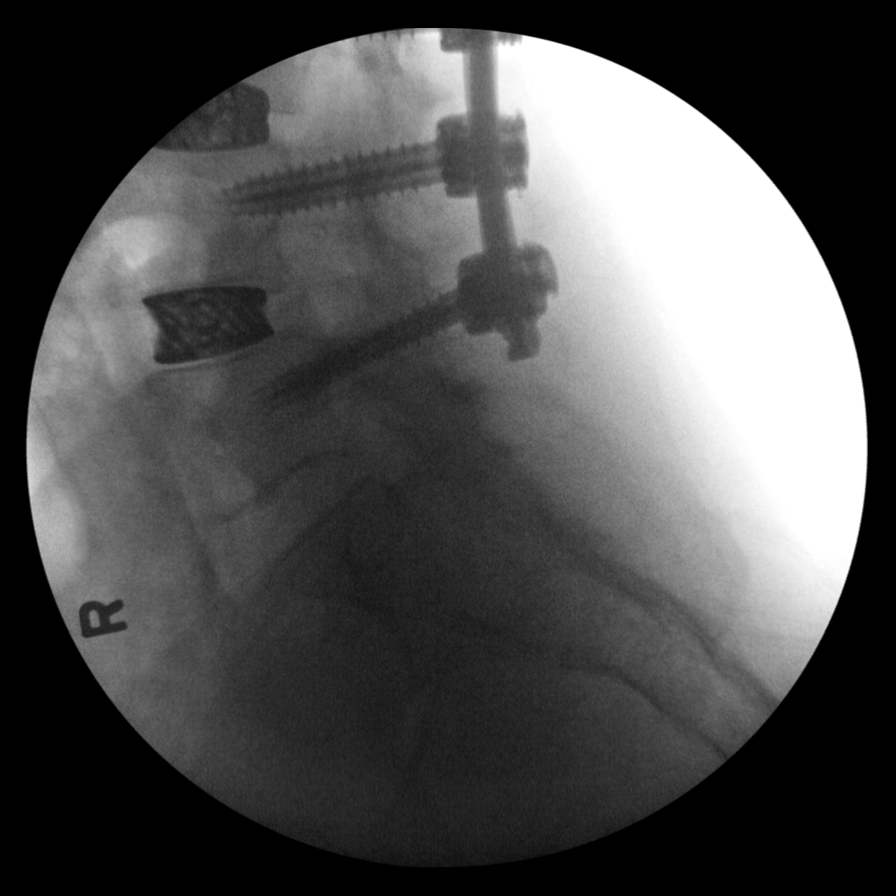
[im 4/7]
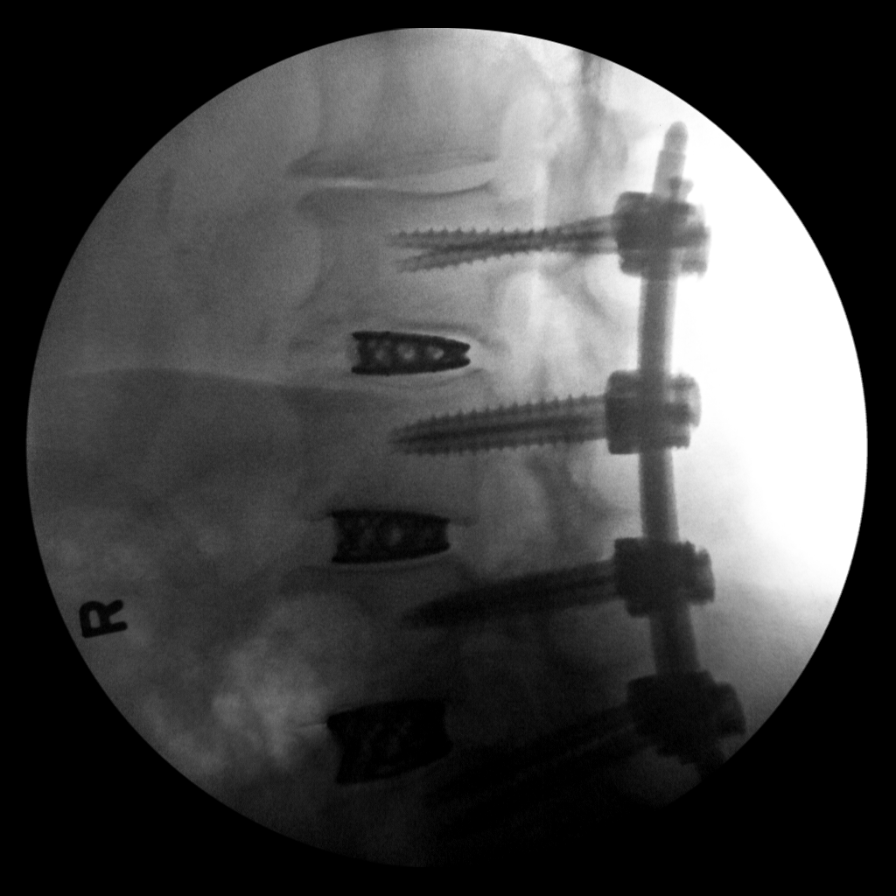
[im 5/7]
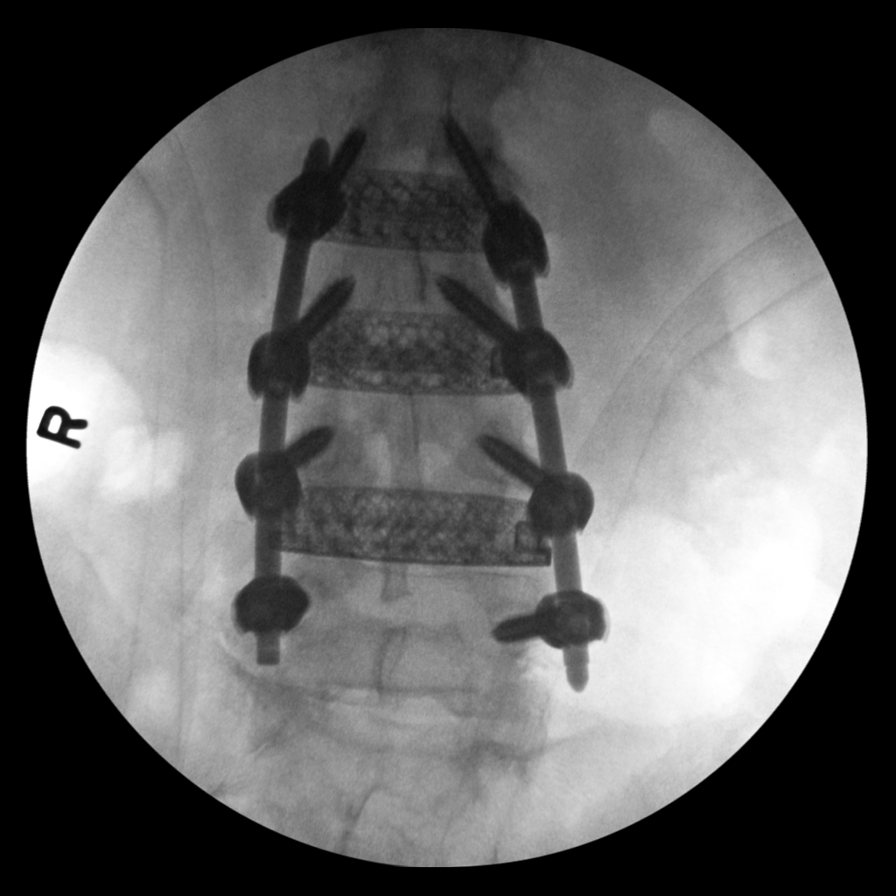
[im 6/7]
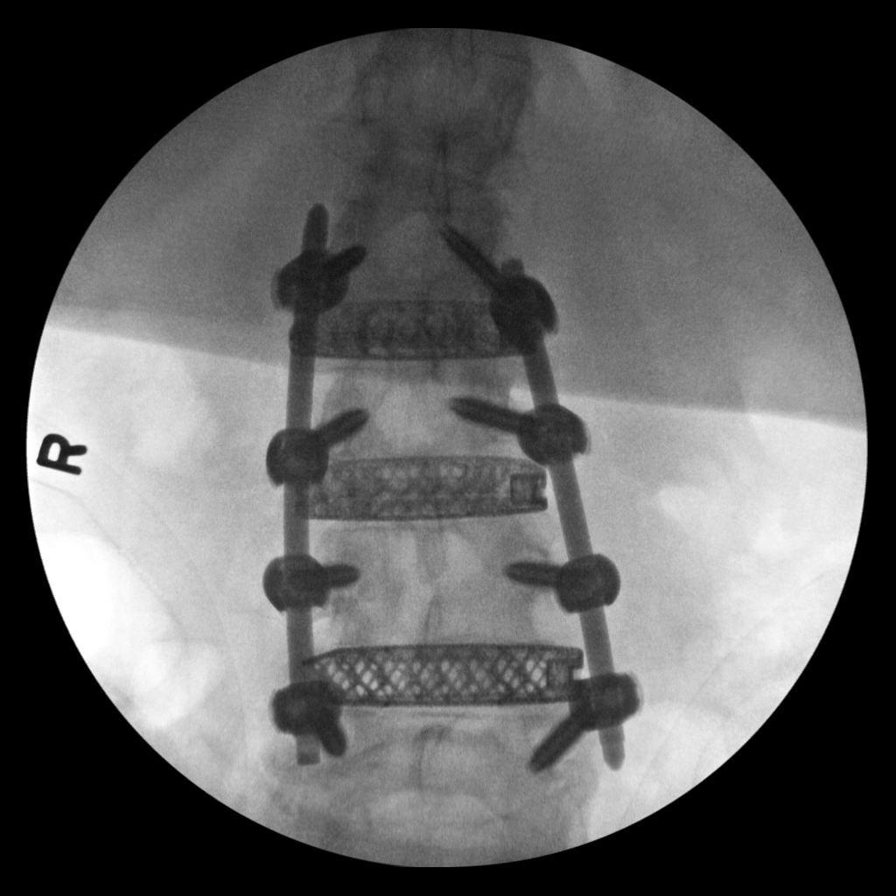
[im 7/7]
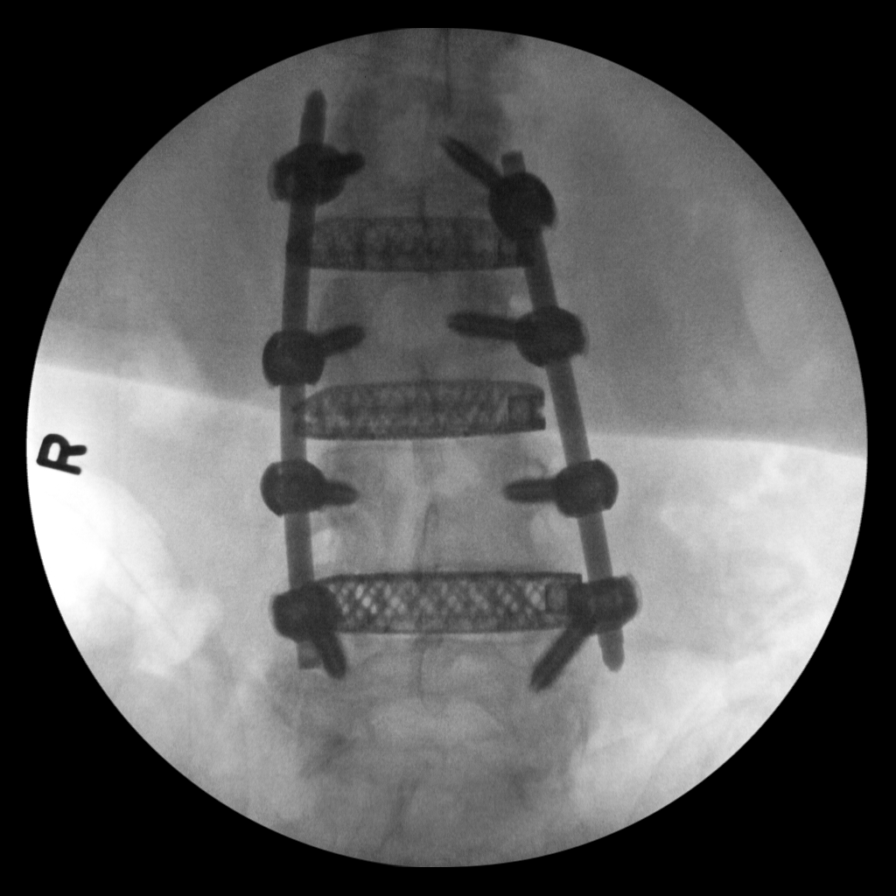

[7 of 7 positions shown; findings below may reference images not displayed]

FINDINGS: Seven fluoroscopic images were obtained during the performance of
procedure and submitted for interpretation only. Frontal and lateral
views of the lumbar spine demonstrate posterior fusion hardware
spanning L2 through L5, with intervening discectomies. Alignment is
near anatomic.

FLUOROSCOPY TIME:  7 minutes 55 seconds.
IMPRESSION: 1. L2-L5 discectomies and posterior fusions.

## 2020-08-14 ENCOUNTER — Ambulatory Visit (INDEPENDENT_AMBULATORY_CARE_PROVIDER_SITE_OTHER): Payer: Medicare Other | Admitting: Emergency Medicine

## 2020-08-14 DIAGNOSIS — I441 Atrioventricular block, second degree: Secondary | ICD-10-CM | POA: Diagnosis not present

## 2020-08-15 LAB — CUP PACEART REMOTE DEVICE CHECK
Battery Remaining Longevity: 100 mo
Battery Remaining Percentage: 95.5 %
Battery Voltage: 3.01 V
Brady Statistic AP VP Percent: 43 %
Brady Statistic AP VS Percent: 1 %
Brady Statistic AS VP Percent: 56 %
Brady Statistic AS VS Percent: 1 %
Brady Statistic RA Percent Paced: 43 %
Brady Statistic RV Percent Paced: 99 %
Date Time Interrogation Session: 20210929020026
Implantable Lead Implant Date: 20200930
Implantable Lead Implant Date: 20200930
Implantable Lead Location: 753859
Implantable Lead Location: 753860
Implantable Lead Model: 3830
Implantable Lead Model: 5076
Implantable Pulse Generator Implant Date: 20200930
Lead Channel Impedance Value: 510 Ohm
Lead Channel Impedance Value: 690 Ohm
Lead Channel Pacing Threshold Amplitude: 0.5 V
Lead Channel Pacing Threshold Amplitude: 0.75 V
Lead Channel Pacing Threshold Pulse Width: 0.4 ms
Lead Channel Pacing Threshold Pulse Width: 1 ms
Lead Channel Sensing Intrinsic Amplitude: 1.5 mV
Lead Channel Sensing Intrinsic Amplitude: 12 mV
Lead Channel Setting Pacing Amplitude: 2 V
Lead Channel Setting Pacing Amplitude: 2.5 V
Lead Channel Setting Pacing Pulse Width: 1 ms
Lead Channel Setting Sensing Sensitivity: 2 mV
Pulse Gen Model: 2272
Pulse Gen Serial Number: 9156742

## 2020-08-16 NOTE — Progress Notes (Signed)
Remote pacemaker transmission.   

## 2020-08-27 DIAGNOSIS — L814 Other melanin hyperpigmentation: Secondary | ICD-10-CM | POA: Diagnosis not present

## 2020-08-27 DIAGNOSIS — L821 Other seborrheic keratosis: Secondary | ICD-10-CM | POA: Diagnosis not present

## 2020-08-27 DIAGNOSIS — D225 Melanocytic nevi of trunk: Secondary | ICD-10-CM | POA: Diagnosis not present

## 2020-08-27 DIAGNOSIS — Z85820 Personal history of malignant melanoma of skin: Secondary | ICD-10-CM | POA: Diagnosis not present

## 2020-08-27 DIAGNOSIS — D1801 Hemangioma of skin and subcutaneous tissue: Secondary | ICD-10-CM | POA: Diagnosis not present

## 2020-08-29 ENCOUNTER — Telehealth: Payer: Self-pay | Admitting: Family Medicine

## 2020-08-29 NOTE — Progress Notes (Signed)
  Chronic Care Management   Outreach Note  08/29/2020 Name: JAISLYN BLINN MRN: [id number] DOB: 13-Aug-1941  Referred by: Ann Held, DO Reason for referral : No chief complaint on file.   An unsuccessful telephone outreach was attempted today. The patient was referred to the pharmacist for assistance with care management and care coordination.   Follow Up Plan:   Carley Perdue [employer] Scheduler

## 2020-08-30 ENCOUNTER — Telehealth: Payer: Self-pay | Admitting: Family Medicine

## 2020-08-30 NOTE — Progress Notes (Signed)
  Chronic Care Management   Outreach Note  08/30/2020 Name: JINELLE BUTCHKO MRN: [id number] DOB: October 28, 1941  Referred by: Ann Held, DO Reason for referral : No chief complaint on file.   A second unsuccessful telephone outreach was attempted today. The patient was referred to pharmacist for assistance with care management and care coordination.  Follow Up Plan:   Carley Perdue [employer] Scheduler

## 2020-10-14 ENCOUNTER — Encounter: Payer: Self-pay | Admitting: Family Medicine

## 2020-10-14 DIAGNOSIS — Z780 Asymptomatic menopausal state: Secondary | ICD-10-CM

## 2020-10-15 MED ORDER — MEDROXYPROGESTERONE ACETATE 5 MG PO TABS: ORAL_TABLET | ORAL | 3 refills | Status: AC

## 2020-10-17 ENCOUNTER — Encounter: Payer: Self-pay | Admitting: Family Medicine

## 2020-11-07 ENCOUNTER — Encounter (HOSPITAL_COMMUNITY): Payer: Self-pay

## 2020-11-07 ENCOUNTER — Telehealth (HOSPITAL_COMMUNITY): Payer: Self-pay | Admitting: *Deleted

## 2020-11-07 NOTE — Telephone Encounter (Signed)
Pt sent mychart message about wearing a necklace with a magnetic clasp but patient has not been seen in our clinic. Pt was scheduled for a 80yr f/u 12/10/20 with Dr.McLean by Harriett Sine. After looking at patients chart found out pt has never been seen in our clinic. Per clinic staff no new pt referral was sent for pt to be seen in our clinic. Pts last echo EF was 55%-60%. Pt does not have pulmonary hypertension or heart failure. I called pt to see why appointment was scheduled with our office. Pt did not answer.  Mychart message routed to [facility] (pt see's Dr.Taylor). appointment with Dr.McLean cancelled.

## 2020-11-11 DIAGNOSIS — C672 Malignant neoplasm of lateral wall of bladder: Secondary | ICD-10-CM | POA: Diagnosis not present

## 2020-11-13 ENCOUNTER — Ambulatory Visit (INDEPENDENT_AMBULATORY_CARE_PROVIDER_SITE_OTHER): Payer: Medicare Other

## 2020-11-13 DIAGNOSIS — I441 Atrioventricular block, second degree: Secondary | ICD-10-CM | POA: Diagnosis not present

## 2020-11-13 LAB — CUP PACEART REMOTE DEVICE CHECK
Battery Remaining Longevity: 100 mo
Battery Remaining Percentage: 95.5 %
Battery Voltage: 2.99 V
Brady Statistic AP VP Percent: 45 %
Brady Statistic AP VS Percent: 1 %
Brady Statistic AS VP Percent: 55 %
Brady Statistic AS VS Percent: 1 %
Brady Statistic RA Percent Paced: 44 %
Brady Statistic RV Percent Paced: 99 %
Date Time Interrogation Session: 20211229025649
Implantable Lead Implant Date: 20200930
Implantable Lead Implant Date: 20200930
Implantable Lead Location: 753859
Implantable Lead Location: 753860
Implantable Lead Model: 3830
Implantable Lead Model: 5076
Implantable Pulse Generator Implant Date: 20200930
Lead Channel Impedance Value: 490 Ohm
Lead Channel Impedance Value: 680 Ohm
Lead Channel Pacing Threshold Amplitude: 0.5 V
Lead Channel Pacing Threshold Amplitude: 0.75 V
Lead Channel Pacing Threshold Pulse Width: 0.4 ms
Lead Channel Pacing Threshold Pulse Width: 1 ms
Lead Channel Sensing Intrinsic Amplitude: 12 mV
Lead Channel Sensing Intrinsic Amplitude: 3.6 mV
Lead Channel Setting Pacing Amplitude: 2 V
Lead Channel Setting Pacing Amplitude: 2.5 V
Lead Channel Setting Pacing Pulse Width: 1 ms
Lead Channel Setting Sensing Sensitivity: 2 mV
Pulse Gen Model: 2272
Pulse Gen Serial Number: 9156742

## 2020-11-27 NOTE — Progress Notes (Signed)
Remote pacemaker transmission.   

## 2020-11-28 ENCOUNTER — Encounter: Payer: Self-pay | Admitting: Family Medicine

## 2020-11-29 NOTE — Telephone Encounter (Signed)
We usually see her q6 months in office

## 2020-12-04 NOTE — Telephone Encounter (Signed)
Mel Almond actually called the pt and went over results and told her repeat would be needed---

## 2020-12-10 ENCOUNTER — Encounter (HOSPITAL_COMMUNITY): Payer: Medicare Other | Admitting: Cardiology

## 2020-12-18 DIAGNOSIS — M4120 Other idiopathic scoliosis, site unspecified: Secondary | ICD-10-CM | POA: Diagnosis not present

## 2020-12-18 DIAGNOSIS — Z6831 Body mass index (BMI) 31.0-31.9, adult: Secondary | ICD-10-CM | POA: Diagnosis not present

## 2020-12-18 DIAGNOSIS — M5416 Radiculopathy, lumbar region: Secondary | ICD-10-CM | POA: Diagnosis not present

## 2020-12-18 DIAGNOSIS — M48061 Spinal stenosis, lumbar region without neurogenic claudication: Secondary | ICD-10-CM | POA: Diagnosis not present

## 2020-12-18 DIAGNOSIS — I10 Essential (primary) hypertension: Secondary | ICD-10-CM | POA: Diagnosis not present

## 2021-01-21 ENCOUNTER — Other Ambulatory Visit: Payer: Self-pay

## 2021-01-21 ENCOUNTER — Encounter: Payer: Self-pay | Admitting: Family Medicine

## 2021-01-21 ENCOUNTER — Ambulatory Visit (INDEPENDENT_AMBULATORY_CARE_PROVIDER_SITE_OTHER): Payer: Medicare Other | Admitting: Family Medicine

## 2021-01-21 VITALS — BP 122/80 | HR 60 | Temp 98.0°F | Resp 18 | Wt 174.0 lb

## 2021-01-21 DIAGNOSIS — E559 Vitamin D deficiency, unspecified: Secondary | ICD-10-CM

## 2021-01-21 DIAGNOSIS — E782 Mixed hyperlipidemia: Secondary | ICD-10-CM | POA: Diagnosis not present

## 2021-01-21 DIAGNOSIS — E039 Hypothyroidism, unspecified: Secondary | ICD-10-CM

## 2021-01-21 DIAGNOSIS — I10 Essential (primary) hypertension: Secondary | ICD-10-CM

## 2021-01-21 DIAGNOSIS — H9193 Unspecified hearing loss, bilateral: Secondary | ICD-10-CM | POA: Diagnosis not present

## 2021-01-21 LAB — COMPREHENSIVE METABOLIC PANEL
ALT: 16 U/L (ref 0–35)
AST: 18 U/L (ref 0–37)
Albumin: 4.5 g/dL (ref 3.5–5.2)
Alkaline Phosphatase: 81 U/L (ref 39–117)
BUN: 26 mg/dL — ABNORMAL HIGH (ref 6–23)
CO2: 31 mEq/L (ref 19–32)
Calcium: 9.8 mg/dL (ref 8.4–10.5)
Chloride: 101 mEq/L (ref 96–112)
Creatinine, Ser: 1.1 mg/dL (ref 0.40–1.20)
GFR: 48.09 mL/min — ABNORMAL LOW (ref >60.00)
Glucose, Bld: 110 mg/dL — ABNORMAL HIGH (ref 70–99)
Potassium: 4.2 mEq/L (ref 3.5–5.1)
Sodium: 140 mEq/L (ref 135–145)
Total Bilirubin: 1.4 mg/dL — ABNORMAL HIGH (ref 0.2–1.2)
Total Protein: 6.7 g/dL (ref 6.0–8.3)

## 2021-01-21 LAB — LIPID PANEL
Cholesterol: 170 mg/dL (ref 0–200)
HDL: 48.9 mg/dL (ref >39.00)
LDL Cholesterol: 89 mg/dL (ref 0–99)
NonHDL: 121.1
Total CHOL/HDL Ratio: 3
Triglycerides: 162 mg/dL — ABNORMAL HIGH (ref 0.0–149.0)
VLDL: 32.4 mg/dL (ref 0.0–40.0)

## 2021-01-21 LAB — TSH: TSH: 1.55 u[IU]/mL (ref 0.35–4.50)

## 2021-01-21 LAB — VITAMIN D 25 HYDROXY (VIT D DEFICIENCY, FRACTURES): VITD: 37.7 ng/mL (ref 30.00–100.00)

## 2021-01-21 NOTE — Assessment & Plan Note (Signed)
Check labs today and con't hypothyroid

## 2021-01-21 NOTE — Assessment & Plan Note (Signed)
No change Pt has hearing aids

## 2021-01-21 NOTE — Patient Instructions (Signed)

## 2021-01-21 NOTE — Progress Notes (Signed)
Patient ID: Wendy Velazquez, female    DOB: 1941/08/16  Age: 80 y.o. MRN: [id number]    Subjective:  Subjective  HPI Wendy Velazquez presents for f/u bp and chol.   She would also like her vita d checked.  She is unable to take rx vita d due to heart racing.  She is able to take otc and is taking cod liver oil daily No other complaints today  Review of Systems  Constitutional: Negative for appetite change, diaphoresis, fatigue and unexpected weight change.  HENT: Positive for hearing loss. Negative for congestion.   Eyes: Negative for pain, redness and visual disturbance.  Respiratory: Negative for cough, chest tightness, shortness of breath and wheezing.   Cardiovascular: Negative for chest pain, palpitations and leg swelling.  Endocrine: Negative for cold intolerance, heat intolerance, polydipsia, polyphagia and polyuria.  Genitourinary: Negative for difficulty urinating, dysuria and frequency.  Neurological: Negative for dizziness, light-headedness, numbness and headaches.    History Past Medical History:  Diagnosis Date  . Arthritis   . Complication of anesthesia   . HEARING LOSS, BILATERAL 03/02/2008   Qualifier: Diagnosis of  By: Jerold Coombe    . Hx of cyst of breast 07/21/2012  . Hypertension   . Hypothyroidism 03/02/2008   Qualifier: Diagnosis of  By: Jerold Coombe    . Melanoma ([place])   . Mobitz type II atrioventricular block 08/15/2019  . MRSA (methicillin resistant staph aureus) culture positive   . Osteopenia   . PONV (postoperative nausea and vomiting)   . Rapid heart beat    benigh  . Transitional cell carcinoma ([place])   . Vitamin D deficiency 09/12/2019    She has a past surgical history that includes Tonsillectomy; Abdominal hysterectomy (1987); Melanoma excision (1977); Melanoma excision (06-24-09); Cataract extraction (2004); Skin cancer excision (1999); Eye surgery; teeth implants; Back surgery; PACEMAKER IMPLANT (N/A, 08/16/2019); Anterior lat lumbar  fusion (Left, 12/19/2019); and Lumbar percutaneous pedicle screw 3 level (N/A, 12/19/2019).   Her family history includes Alzheimer's disease in her mother; Dementia in her mother; Diabetes in her mother; Hypertension in her father; Stroke in her father.She reports that she quit smoking about 39 years ago. She has a 4.50 pack-year smoking history. She has never used smokeless tobacco. She reports current alcohol use of about 1.0 - 2.0 standard drink of alcohol per week. She reports that she does not use drugs.  Current Outpatient Medications on File Prior to Visit  Medication Sig Dispense Refill  . acetaminophen (TYLENOL) 650 MG CR tablet Take 650 mg by mouth 2 (two) times daily.    Marland Kitchen amLODipine (NORVASC) 5 MG tablet TAKE 1 TABLET DAILY 90 tablet 3  . ARTIFICIAL TEAR SOLUTION OP Place 1 drop into both eyes daily as needed (irritation).    . Cholecalciferol (VITAMIN D3) 50 MCG (2000 UT) capsule Take 2,000 Units by mouth daily.    . COD LIVER OIL PO Take by mouth.    . docusate sodium (COLACE) 100 MG capsule Take 200 mg by mouth daily as needed for mild constipation.    Marland Kitchen estradiol (ESTRACE) 0.5 MG tablet Take 1 tablet (0.5 mg total) by mouth daily. 90 tablet 1  . guaifenesin (HUMIBID E) 400 MG TABS tablet Take 400 mg by mouth at bedtime.    . Homeopathic Products [facility] SINUS RELIEF NA) Place 1 spray into the nose daily as needed (congestion).    . Lactobacillus (AZO COMPLETE FEMININE BALANCE) CAPS Take 1 capsule by mouth daily.    Marland Kitchen  medroxyPROGESTERone (PROVERA) 5 MG tablet 1 tab po qd 10 days a month as directed.  Brand name necessary. 90 tablet 3  . meloxicam (MOBIC) 15 MG tablet 1/2-1 po qd prn 90 tablet 1  . Misc Natural Products (GLUCOSAMINE CHOND COMPLEX/MSM) TABS Take 1 tablet by mouth 2 (two) times daily.    . propranolol (INDERAL) 10 MG tablet Take 1 tablet (10 mg total) by mouth 2 (two) times daily. 180 tablet 3  . sodium chloride (OCEAN) 0.65 % SOLN nasal spray Place 1 spray into both  nostrils at bedtime as needed for congestion.    Marland Kitchen spironolactone (ALDACTONE) 25 MG tablet 1/2 tab po qd 45 tablet 3  . SYNTHROID 75 MCG tablet Take 1 tablet (75 mcg total) by mouth daily. 90 tablet 3  . Cholecalciferol (VITAMIN D-1000 MAX ST) 25 MCG (1000 UT) tablet Take 1,000 Units by mouth daily.  (Patient not taking: Reported on 01/21/2021)     No current facility-administered medications on file prior to visit.     Objective:  Objective  Physical Exam Vitals and nursing note reviewed.  Constitutional:      Appearance: She is well-developed and well-nourished.  HENT:     Head: Normocephalic and atraumatic.  Eyes:     Extraocular Movements: EOM normal.     Conjunctiva/sclera: Conjunctivae normal.  Neck:     Thyroid: No thyromegaly.     Vascular: No carotid bruit or JVD.  Cardiovascular:     Rate and Rhythm: Normal rate and regular rhythm.     Heart sounds: Normal heart sounds. No murmur heard.   Pulmonary:     Effort: Pulmonary effort is normal. No respiratory distress.     Breath sounds: Normal breath sounds. No wheezing or rales.  Chest:     Chest wall: No tenderness.  Musculoskeletal:        General: No edema.     Cervical back: Normal range of motion and neck supple.  Neurological:     Mental Status: She is alert and oriented to person, place, and time.  Psychiatric:        Mood and Affect: Mood and affect normal.    BP 122/80 (BP Location: Left Arm, Patient Position: Sitting, Cuff Size: Normal)   Pulse 60   Temp 98 F (36.7 C) (Oral)   Resp 18   Wt 174 lb (78.9 kg)   SpO2 97%   BMI 31.83 kg/m  Wt Readings from Last 3 Encounters:  01/21/21 174 lb (78.9 kg)  06/11/20 170 lb (77.1 kg)  06/11/20 170 lb 12.8 oz (77.5 kg)     Lab Results  Component Value Date   WBC 6.2 12/15/2019   HGB 15.0 12/15/2019   HCT 47.8 (H) 12/15/2019   PLT 214 12/15/2019   GLUCOSE 107 (H) 06/11/2020   CHOL 159 06/11/2020   TRIG 157.0 (H) 06/11/2020   HDL 51.80 06/11/2020    LDLDIRECT 99.0 09/12/2019   LDLCALC 76 06/11/2020   ALT 14 06/11/2020   AST 16 06/11/2020   NA 138 06/11/2020   K 4.1 06/11/2020   CL 101 06/11/2020   CREATININE 0.98 06/11/2020   BUN 23 06/11/2020   CO2 31 06/11/2020   TSH 1.08 06/11/2020   MICROALBUR 2.0 (H) 04/18/2015    No results found.   Assessment & Plan:  Plan  I am having Johnay A. Yu maintain her acetaminophen, Vitamin D-1000 Max St, guaifenesin, Glucosamine Chond Complex/MSM, AZO Complete Feminine Balance, ARTIFICIAL TEAR SOLUTION OP, docusate sodium,  sodium chloride, Homeopathic Products [facility] SINUS RELIEF NA), amLODipine, meloxicam, estradiol, spironolactone, propranolol, Synthroid, medroxyPROGESTERone, Vitamin D3, and COD LIVER OIL PO.  No orders of the defined types were placed in this encounter.   Problem List Items Addressed This Visit      Unprioritized   Essential hypertension    Well controlled, no changes to meds. Encouraged heart healthy diet such as the DASH diet and exercise as tolerated.       Hearing loss    No change Pt has hearing aids      Hypothyroidism    Check labs today and con't hypothyroid      Relevant Orders   TSH   Mixed hyperlipidemia    Encouraged heart healthy diet, increase exercise, avoid trans fats, consider a krill oil cap daily      Vitamin D deficiency - Primary    Check labs today con't otc vita d and cod liver oil      Relevant Orders   Vitamin D (25 hydroxy)    Other Visit Diagnoses    Primary hypertension       Relevant Orders   Lipid panel   Comprehensive metabolic panel      Follow-up: Return in about 6 months (around 07/24/2021), or if symptoms worsen or fail to improve, for annual exam, fasting.  Ann Held, DO

## 2021-01-21 NOTE — Assessment & Plan Note (Signed)
Check labs today con't otc vita d and cod liver oil

## 2021-01-21 NOTE — Assessment & Plan Note (Signed)
Encouraged heart healthy diet, increase exercise, avoid trans fats, consider a krill oil cap daily 

## 2021-01-21 NOTE — Assessment & Plan Note (Signed)
Well controlled, no changes to meds. Encouraged heart healthy diet such as the DASH diet and exercise as tolerated.  °

## 2021-01-28 DIAGNOSIS — Z961 Presence of intraocular lens: Secondary | ICD-10-CM | POA: Diagnosis not present

## 2021-02-12 ENCOUNTER — Ambulatory Visit (INDEPENDENT_AMBULATORY_CARE_PROVIDER_SITE_OTHER): Payer: Medicare Other

## 2021-02-12 DIAGNOSIS — I441 Atrioventricular block, second degree: Secondary | ICD-10-CM | POA: Diagnosis not present

## 2021-02-12 LAB — CUP PACEART REMOTE DEVICE CHECK
Battery Remaining Longevity: 100 mo
Battery Remaining Percentage: 95.5 %
Battery Voltage: 3.01 V
Brady Statistic AP VP Percent: 47 %
Brady Statistic AP VS Percent: 1 %
Brady Statistic AS VP Percent: 53 %
Brady Statistic AS VS Percent: 1 %
Brady Statistic RA Percent Paced: 46 %
Brady Statistic RV Percent Paced: 99 %
Date Time Interrogation Session: 20220330020029
Implantable Lead Implant Date: 20200930
Implantable Lead Implant Date: 20200930
Implantable Lead Location: 753859
Implantable Lead Location: 753860
Implantable Lead Model: 3830
Implantable Lead Model: 5076
Implantable Pulse Generator Implant Date: 20200930
Lead Channel Impedance Value: 510 Ohm
Lead Channel Impedance Value: 690 Ohm
Lead Channel Pacing Threshold Amplitude: 0.5 V
Lead Channel Pacing Threshold Amplitude: 0.75 V
Lead Channel Pacing Threshold Pulse Width: 0.4 ms
Lead Channel Pacing Threshold Pulse Width: 1 ms
Lead Channel Sensing Intrinsic Amplitude: 12 mV
Lead Channel Sensing Intrinsic Amplitude: 2.2 mV
Lead Channel Setting Pacing Amplitude: 2 V
Lead Channel Setting Pacing Amplitude: 2.5 V
Lead Channel Setting Pacing Pulse Width: 1 ms
Lead Channel Setting Sensing Sensitivity: 2 mV
Pulse Gen Model: 2272
Pulse Gen Serial Number: 9156742

## 2021-02-16 NOTE — Progress Notes (Signed)
Cardiology Office Note Date:  02/18/2021  Patient ID:  Wendy Velazquez, Wendy Velazquez 04-23-1941, MRN [id number] PCP:  Ann Held, DO  Cardiologist/Electrophysiologist: Dr. Lovena Le    Chief Complaint:  annual visit  History of Present Illness: Wendy Velazquez is a 80 y.o. female with history of HTN, Mobitz II and CHB w/PPM, hypothyroidism, Afib.  She comes in today to be seen for Dr. Lovena Le, last seen by him Jan 2021, at that time noted new Afib (about an hour) and started on Eliquis.  TODAY She is doing well. Frustrated, spent thousands of dollars on hearing aids that don't work. She denies any CP, palpitations, or cardiac awareness. She stays active, going to ballroom dancing class after her visit here. No SOB, DOE No dizzy spells, near syncope or syncope.  She never started the Eliquis, says she just doesn't think she needs it, her husband was on blood thinners and does not want that life. We discussed rational for Eliquis with noted AFib and rational for stroke prevention She thinks that was provoked by low Vit D levels and does not want to take the blood thinner. With her device check today, In feb she had a 7hour episode, April last year had 2 hours of AF, she feels that was 2/2 severe constipation 2/2 pain meds after her back surgery No further episode of any significant duration She remains un agreeable to [facility], we will continue to monitor burden     Device information SJM dual chamber PPM implanted 08/16/2019   Past Medical History:  Diagnosis Date  . Arthritis   . Complication of anesthesia   . HEARING LOSS, BILATERAL 03/02/2008   Qualifier: Diagnosis of  By: Jerold Coombe    . Hx of cyst of breast 07/21/2012  . Hypertension   . Hypothyroidism 03/02/2008   Qualifier: Diagnosis of  By: Jerold Coombe    . Melanoma ([place])   . Mobitz type II atrioventricular block 08/15/2019  . MRSA (methicillin resistant staph aureus) culture positive   . Osteopenia   . PONV  (postoperative nausea and vomiting)   . Rapid heart beat    benigh  . Transitional cell carcinoma ([place])   . Vitamin D deficiency 09/12/2019    Past Surgical History:  Procedure Laterality Date  . ABDOMINAL HYSTERECTOMY  1987   partial  . ANTERIOR LAT LUMBAR FUSION Left 12/19/2019   Procedure: Left Lumbar Two-Three, Lumbar Three-Four, Lumbar Four-Five Anterolateral lumbar Interbody Fusion;  Surgeon: Erline Levine, MD;  Location: [place];  Service: Neurosurgery;  Laterality: Left;  Left Lumbar 2-3 Lumbar 3-4 Lumbar 4-5 Anterolateral lumbar interbody fusion with percutaneous pedicle screws  . BACK SURGERY     08/02/2018  . CATARACT EXTRACTION  2004  . EYE SURGERY     cataract b/l  . LUMBAR PERCUTANEOUS PEDICLE SCREW 3 LEVEL N/A 12/19/2019   Procedure: Lumbar Two-Three, Lumbar Three-Four, Lumbar Four-Five Percutaneous Pedicle Screw Placement;  Surgeon: Erline Levine, MD;  Location: [place];  Service: Neurosurgery;  Laterality: N/A;  . [place]   left leg  . MELANOMA EXCISION  06-24-09   r foot  . PACEMAKER IMPLANT N/A 08/16/2019   Procedure: PACEMAKER IMPLANT;  Surgeon: Evans Lance, MD;  Location: [place] [facility];  Service: Cardiovascular;  Laterality: N/A;  . SKIN CANCER EXCISION  1999   bladder transitional cell  . teeth implants    . TONSILLECTOMY      Current Outpatient Medications  Medication Sig Dispense  Refill  . acetaminophen (TYLENOL) 650 MG CR tablet Take 650 mg by mouth 2 (two) times daily.    Marland Kitchen amLODipine (NORVASC) 5 MG tablet TAKE 1 TABLET DAILY 90 tablet 3  . ARTIFICIAL TEAR SOLUTION OP Place 1 drop into both eyes daily as needed (irritation).    . Cholecalciferol (VITAMIN D3) 50 MCG (2000 UT) capsule Take 2,000 Units by mouth daily.    . COD LIVER OIL PO Take by mouth.    . docusate sodium (COLACE) 100 MG capsule Take 200 mg by mouth daily as needed for mild constipation.    Marland Kitchen estradiol (ESTRACE) 0.5 MG tablet Take 1 tablet (0.5 mg total) by mouth daily.  90 tablet 1  . guaifenesin (HUMIBID E) 400 MG TABS tablet Take 400 mg by mouth at bedtime.    . Homeopathic Products [facility] SINUS RELIEF NA) Place 1 spray into the nose daily as needed (congestion).    . Lactobacillus (AZO COMPLETE FEMININE BALANCE) CAPS Take 1 capsule by mouth daily.    . medroxyPROGESTERone (PROVERA) 5 MG tablet 1 tab po qd 10 days a month as directed.  Brand name necessary. 90 tablet 3  . meloxicam (MOBIC) 15 MG tablet 1/2-1 po qd prn 90 tablet 1  . Misc Natural Products (GLUCOSAMINE CHOND COMPLEX/MSM) TABS Take 1 tablet by mouth 2 (two) times daily.    . propranolol (INDERAL) 10 MG tablet Take 1 tablet (10 mg total) by mouth 2 (two) times daily. 180 tablet 3  . sodium chloride (OCEAN) 0.65 % SOLN nasal spray Place 1 spray into both nostrils at bedtime as needed for congestion.    Marland Kitchen spironolactone (ALDACTONE) 25 MG tablet 1/2 tab po qd 45 tablet 3  . SYNTHROID 75 MCG tablet Take 1 tablet (75 mcg total) by mouth daily. 90 tablet 3   No current facility-administered medications for this visit.    Allergies:   Antihistamines, diphenhydramine-type; Codeine; Hydrocodone-acetaminophen; Lactose intolerance (gi); Tramadol hcl; Gabapentin; and Vitamin d analogs   Social History:  The patient  reports that she quit smoking about 39 years ago. She has a 4.50 pack-year smoking history. She has never used smokeless tobacco. She reports current alcohol use of about 1.0 - 2.0 standard drink of alcohol per week. She reports that she does not use drugs.   Family History:  The patient's family history includes Alzheimer's disease in her mother; Dementia in her mother; Diabetes in her mother; Hypertension in her father; Stroke in her father.  ROS:  Please see the history of present illness.    All other systems are reviewed and otherwise negative.   PHYSICAL EXAM:  VS:  BP (!) 150/82   Pulse 74   Ht 5\' 2"  (1.575 m)   Wt 174 lb (78.9 kg)   SpO2 95%   BMI 31.83 kg/m  BMI: Body mass  index is 31.83 kg/m. Well nourished, well developed, in no acute distress HEENT: normocephalic, atraumatic Neck: no JVD, carotid bruits or masses Cardiac:  RRR; no significant murmurs, no rubs, or gallops Lungs:  CTA b/l, no wheezing, rhonchi or rales Abd: soft, nontender MS: no deformity or atrophy Ext: no edema Skin: warm and dry, no rash Neuro:  No gross deficits appreciated Psych: euthymic mood, full affect  PPM site is stable, no tethering or discomfort   EKG:  Done today and reviewed by myself shows  SR/ V paced, 63bpm  Device interrogation done today and reviewed by myself:  Battery and lead measurements are good +  AMS Longest 7hrs 53min Feb last year <1% burden   08/17/2019; TTE IMPRESSIONS  1. Left ventricular ejection fraction, by visual estimation, is 55 to  60%. The left ventricle has normal function. Normal left ventricular size.  Left ventricular septal wall thickness was mildly increased. There is  mildly increased left ventricular  hypertrophy.  2. Global right ventricle has normal systolic function.The right  ventricular size is normal. No increase in right ventricular wall  thickness.  3. Left atrial size was normal.  4. Right atrial size was normal.  5. The mitral valve is normal in structure. Mild mitral valve  regurgitation.  6. The tricuspid valve is normal in structure. Tricuspid valve  regurgitation is mild.  7. The aortic valve is tricuspid Aortic valve regurgitation is mild by  color flow Doppler. Mild to moderate aortic valve sclerosis/calcification  without any evidence of aortic stenosis.  8. The pulmonic valve was grossly normal. Pulmonic valve regurgitation is  mild by color flow Doppler.  9. Mildly elevated pulmonary artery systolic pressure.     Recent Labs: 01/21/2021: ALT 16; BUN 26; Creatinine, Ser 1.10; Potassium 4.2; Sodium 140; TSH 1.55  01/21/2021: Cholesterol 170; HDL 48.90; LDL Cholesterol 89; Total CHOL/HDL Ratio 3;  Triglycerides 162.0; VLDL 32.4   CrCl cannot be calculated (Patient's most recent lab result is older than the maximum 21 days allowed.).   Wt Readings from Last 3 Encounters:  02/18/21 174 lb (78.9 kg)  01/21/21 174 lb (78.9 kg)  06/11/20 170 lb (77.1 kg)     Other studies reviewed: Additional studies/records reviewed today include: summarized above  ASSESSMENT AND PLAN:  1. PPM     Intact function, no programming changes made  2. HTN     High today though she rpeorts checking her BP every night and is much better 120's-130's/80's  3. Paroxysmal AFib     CHA2DS2Vasc is 4     As discussed above     The pt decline OAC     Low burden, <1%  Disposition: F/u with remotes as usual and in clinic in a year, sooner if needed.   Current medicines are reviewed at length with the patient today.  The patient did not have any concerns regarding medicines.  Venetia Night, PA-C 02/18/2021 5:39 PM     [place] [address] [place] [address] [phone] (office)  [phone] (fax)

## 2021-02-18 ENCOUNTER — Other Ambulatory Visit: Payer: Self-pay

## 2021-02-18 ENCOUNTER — Ambulatory Visit (INDEPENDENT_AMBULATORY_CARE_PROVIDER_SITE_OTHER): Payer: Medicare Other | Admitting: Physician Assistant

## 2021-02-18 ENCOUNTER — Encounter: Payer: Self-pay | Admitting: Physician Assistant

## 2021-02-18 VITALS — BP 150/82 | HR 74 | Ht 62.0 in | Wt 174.0 lb

## 2021-02-18 DIAGNOSIS — I10 Essential (primary) hypertension: Secondary | ICD-10-CM

## 2021-02-18 DIAGNOSIS — I441 Atrioventricular block, second degree: Secondary | ICD-10-CM | POA: Diagnosis not present

## 2021-02-18 DIAGNOSIS — I480 Paroxysmal atrial fibrillation: Secondary | ICD-10-CM

## 2021-02-18 DIAGNOSIS — Z950 Presence of cardiac pacemaker: Secondary | ICD-10-CM

## 2021-02-18 NOTE — Patient Instructions (Signed)

## 2021-02-19 LAB — CUP PACEART INCLINIC DEVICE CHECK
Battery Remaining Longevity: 96 mo
Battery Voltage: 2.99 V
Brady Statistic RA Percent Paced: 46 %
Brady Statistic RV Percent Paced: 99.83 %
Date Time Interrogation Session: 20220405145000
Implantable Lead Implant Date: 20200930
Implantable Lead Implant Date: 20200930
Implantable Lead Location: 753859
Implantable Lead Location: 753860
Implantable Lead Model: 3830
Implantable Lead Model: 5076
Implantable Pulse Generator Implant Date: 20200930
Lead Channel Impedance Value: 487.5 Ohm
Lead Channel Impedance Value: 662.5 Ohm
Lead Channel Pacing Threshold Amplitude: 0.75 V
Lead Channel Pacing Threshold Amplitude: 0.75 V
Lead Channel Pacing Threshold Amplitude: 0.75 V
Lead Channel Pacing Threshold Amplitude: 0.75 V
Lead Channel Pacing Threshold Pulse Width: 0.4 ms
Lead Channel Pacing Threshold Pulse Width: 0.4 ms
Lead Channel Pacing Threshold Pulse Width: 1 ms
Lead Channel Pacing Threshold Pulse Width: 1 ms
Lead Channel Sensing Intrinsic Amplitude: 12 mV
Lead Channel Sensing Intrinsic Amplitude: 3.5 mV
Lead Channel Setting Pacing Amplitude: 2 V
Lead Channel Setting Pacing Amplitude: 2.5 V
Lead Channel Setting Pacing Pulse Width: 1 ms
Lead Channel Setting Sensing Sensitivity: 2 mV
Pulse Gen Model: 2272
Pulse Gen Serial Number: 9156742

## 2021-02-19 NOTE — Addendum Note (Signed)
Addended by: Claude Manges on: 02/19/2021 04:05 PM   Modules accepted: Orders

## 2021-02-25 NOTE — Progress Notes (Signed)
Remote pacemaker transmission.   

## 2021-04-22 ENCOUNTER — Encounter: Payer: Medicare Other | Admitting: Family Medicine

## 2021-04-22 ENCOUNTER — Other Ambulatory Visit: Payer: Self-pay

## 2021-04-22 NOTE — Progress Notes (Signed)
MyChart Video Visit    Virtual Visit via Video Note   This visit type was conducted due to national recommendations for restrictions regarding the COVID-19 Pandemic (e.g. social distancing) in an effort to limit this patient's exposure and mitigate transmission in our community. This patient is at least at moderate risk for complications without adequate follow up. This format is felt to be most appropriate for this patient at this time. Physical exam was limited by quality of the video and audio technology used for the visit. 0- was able to get the patient set up on a video visit.  Patient location: Home Patient and provider in visit Provider location: Office  I discussed the limitations of evaluation and management by telemedicine and the availability of in person appointments. The patient expressed understanding and agreed to proceed.  Visit Date: 04/22/2021  Today's healthcare provider: Ann Held, DO     Subjective:    Patient ID: Wendy Velazquez, female    DOB: 01-Oct-1941, 80 y.o.   MRN: [id number]  No chief complaint on file.   HPI Patient is in today for a video visit.   Past Medical History:  Diagnosis Date   Arthritis    Complication of anesthesia    HEARING LOSS, BILATERAL 03/02/2008   Qualifier: Diagnosis of  By: Jerold Coombe     Hx of cyst of breast 07/21/2012   Hypertension    Hypothyroidism 03/02/2008   Qualifier: Diagnosis of  By: Jerold Coombe     Melanoma [facility])    Mobitz type II atrioventricular block 08/15/2019   MRSA (methicillin resistant staph aureus) culture positive    Osteopenia    PONV (postoperative nausea and vomiting)    Rapid heart beat    benigh   Transitional cell carcinoma ([place])    Vitamin D deficiency 09/12/2019    Past Surgical History:  Procedure Laterality Date   ABDOMINAL HYSTERECTOMY  1987   partial   ANTERIOR LAT LUMBAR FUSION Left 12/19/2019   Procedure: Left Lumbar Two-Three, Lumbar Three-Four, Lumbar  Four-Five Anterolateral lumbar Interbody Fusion;  Surgeon: Erline Levine, MD;  Location: [facility];  Service: Neurosurgery;  Laterality: Left;  Left Lumbar 2-3 Lumbar 3-4 Lumbar 4-5 Anterolateral lumbar interbody fusion with percutaneous pedicle screws   BACK SURGERY     08/02/2018   CATARACT EXTRACTION  2004   EYE SURGERY     cataract b/l   LUMBAR PERCUTANEOUS PEDICLE SCREW 3 LEVEL N/A 12/19/2019   Procedure: Lumbar Two-Three, Lumbar Three-Four, Lumbar Four-Five Percutaneous Pedicle Screw Placement;  Surgeon: Erline Levine, MD;  Location: [facility];  Service: Neurosurgery;  Laterality: N/A;   MELANOMA EXCISION  1977   left leg   MELANOMA EXCISION  06-24-09   r foot   PACEMAKER IMPLANT N/A 08/16/2019   Procedure: PACEMAKER IMPLANT;  Surgeon: Evans Lance, MD;  Location: [facility];  Service: Cardiovascular;  Laterality: N/A;   SKIN CANCER EXCISION  1999   bladder transitional cell   teeth implants     TONSILLECTOMY      Family History  Problem Relation Age of Onset   Diabetes Mother    Alzheimer's disease Mother    Dementia Mother    Stroke Father    Hypertension Father     Social History   Socioeconomic History   Marital status: Widowed    Spouse name: Not on file   Number of children: Not on file   Years of education: Not on  file   Highest education level: Not on file  Occupational History   Occupation: accounting    Employer: [employer]    Comment: 4 days a week  Tobacco Use   Smoking status: Former Smoker    Packs/day: 0.30    Years: 15.00    Pack years: 4.50    Quit date: 03/15/1981    Years since quitting: 40.1   Smokeless tobacco: Never Used  Vaping Use   Vaping Use: Never used  Substance and Sexual Activity   Alcohol use: Yes    Alcohol/week: 1.0 - 2.0 standard drink    Types: 1 - 2 Glasses of wine per week   Drug use: No   Sexual activity: Not Currently    Partners: Male  Other Topics Concern   Not on file  Social History Narrative    Exercise--- dancing   Social Determinants of Health   Financial Resource Strain: Low Risk    Difficulty of Paying Living Expenses: Not hard at all  Food Insecurity: No Food Insecurity   Worried About Charity fundraiser in the Last Year: Never true   [place] in the Last Year: Never true  Transportation Needs: No Transportation Needs   Lack of Transportation (Medical): No   Lack of Transportation (Non-Medical): No  Physical Activity: Not on file  Stress: Not on file  Social Connections: Not on file  Intimate Partner Violence: Not on file    Outpatient Medications Prior to Visit  Medication Sig Dispense Refill   acetaminophen (TYLENOL) 650 MG CR tablet Take 650 mg by mouth 2 (two) times daily.     amLODipine (NORVASC) 5 MG tablet TAKE 1 TABLET DAILY 90 tablet 3   ARTIFICIAL TEAR SOLUTION OP Place 1 drop into both eyes daily as needed (irritation).     Cholecalciferol (VITAMIN D3) 50 MCG (2000 UT) capsule Take 2,000 Units by mouth daily.     COD LIVER OIL PO Take by mouth.     docusate sodium (COLACE) 100 MG capsule Take 200 mg by mouth daily as needed for mild constipation.     estradiol (ESTRACE) 0.5 MG tablet Take 1 tablet (0.5 mg total) by mouth daily. 90 tablet 1   guaifenesin (HUMIBID E) 400 MG TABS tablet Take 400 mg by mouth at bedtime.     Homeopathic Products [facility] SINUS RELIEF NA) Place 1 spray into the nose daily as needed (congestion).     Lactobacillus (AZO COMPLETE FEMININE BALANCE) CAPS Take 1 capsule by mouth daily.     medroxyPROGESTERone (PROVERA) 5 MG tablet 1 tab po qd 10 days a month as directed.  Brand name necessary. 90 tablet 3   meloxicam (MOBIC) 15 MG tablet 1/2-1 po qd prn 90 tablet 1   Misc Natural Products (GLUCOSAMINE CHOND COMPLEX/MSM) TABS Take 1 tablet by mouth 2 (two) times daily.     propranolol (INDERAL) 10 MG tablet Take 1 tablet (10 mg total) by mouth 2 (two) times daily. 180 tablet 3   sodium chloride (OCEAN) 0.65 % SOLN nasal spray  Place 1 spray into both nostrils at bedtime as needed for congestion.     spironolactone (ALDACTONE) 25 MG tablet 1/2 tab po qd 45 tablet 3   SYNTHROID 75 MCG tablet Take 1 tablet (75 mcg total) by mouth daily. 90 tablet 3   No facility-administered medications prior to visit.    Allergies  Allergen Reactions   Antihistamines, Diphenhydramine-Type Tinitus   Codeine Nausea Only  Can tolerate hydrocodone   Hydrocodone-Acetaminophen Other (See Comments)   Lactose Intolerance (Gi) Diarrhea    Stomach pain    Tramadol Hcl    Gabapentin Rash   Vitamin D Analogs Palpitations    ROS     Objective:    Physical Exam  There were no vitals taken for this visit. Wt Readings from Last 3 Encounters:  02/18/21 174 lb (78.9 kg)  01/21/21 174 lb (78.9 kg)  06/11/20 170 lb (77.1 kg)    Diabetic Foot Exam - Simple   No data filed    Lab Results  Component Value Date   WBC 6.2 12/15/2019   HGB 15.0 12/15/2019   HCT 47.8 (H) 12/15/2019   PLT 214 12/15/2019   GLUCOSE 110 (H) 01/21/2021   CHOL 170 01/21/2021   TRIG 162.0 (H) 01/21/2021   HDL 48.90 01/21/2021   LDLDIRECT 99.0 09/12/2019   LDLCALC 89 01/21/2021   ALT 16 01/21/2021   AST 18 01/21/2021   NA 140 01/21/2021   K 4.2 01/21/2021   CL 101 01/21/2021   CREATININE 1.10 01/21/2021   BUN 26 (H) 01/21/2021   CO2 31 01/21/2021   TSH 1.55 01/21/2021   MICROALBUR 2.0 (H) 04/18/2015    Lab Results  Component Value Date   TSH 1.55 01/21/2021   Lab Results  Component Value Date   WBC 6.2 12/15/2019   HGB 15.0 12/15/2019   HCT 47.8 (H) 12/15/2019   MCV 95.8 12/15/2019   PLT 214 12/15/2019   Lab Results  Component Value Date   NA 140 01/21/2021   K 4.2 01/21/2021   CO2 31 01/21/2021   GLUCOSE 110 (H) 01/21/2021   BUN 26 (H) 01/21/2021   CREATININE 1.10 01/21/2021   BILITOT 1.4 (H) 01/21/2021   ALKPHOS 81 01/21/2021   AST 18 01/21/2021   ALT 16 01/21/2021   PROT 6.7 01/21/2021   ALBUMIN 4.5 01/21/2021    CALCIUM 9.8 01/21/2021   ANIONGAP 6 12/15/2019   GFR 48.09 (L) 01/21/2021   Lab Results  Component Value Date   CHOL 170 01/21/2021   Lab Results  Component Value Date   HDL 48.90 01/21/2021   Lab Results  Component Value Date   LDLCALC 89 01/21/2021   Lab Results  Component Value Date   TRIG 162.0 (H) 01/21/2021   Lab Results  Component Value Date   CHOLHDL 3 01/21/2021   No results found for: HGBA1C     Assessment & Plan:   Problem List Items Addressed This Visit   None      No orders of the defined types were placed in this encounter.   I discussed the assessment and treatment plan with the patient. The patient was provided an opportunity to ask questions and all were answered. The patient agreed with the plan and demonstrated an understanding of the instructions.   The patient was advised to call back or seek an in-person evaluation if the symptoms worsen or if the condition fails to improve as anticipated.  I provided 20 minutes of face-to-face time during this encounter.   Ann Held, DO [place] [facility] [phone] (phone) [phone] (fax)  [facility]

## 2021-05-14 ENCOUNTER — Ambulatory Visit (INDEPENDENT_AMBULATORY_CARE_PROVIDER_SITE_OTHER): Payer: Medicare Other

## 2021-05-14 DIAGNOSIS — I441 Atrioventricular block, second degree: Secondary | ICD-10-CM | POA: Diagnosis not present

## 2021-05-14 LAB — CUP PACEART REMOTE DEVICE CHECK
Battery Remaining Longevity: 78 mo
Battery Remaining Percentage: 80 %
Battery Voltage: 2.99 V
Brady Statistic AP VP Percent: 51 %
Brady Statistic AP VS Percent: 1 %
Brady Statistic AS VP Percent: 49 %
Brady Statistic AS VS Percent: 1 %
Brady Statistic RA Percent Paced: 50 %
Brady Statistic RV Percent Paced: 99 %
Date Time Interrogation Session: 20220629032301
Implantable Lead Implant Date: 20200930
Implantable Lead Implant Date: 20200930
Implantable Lead Location: 753859
Implantable Lead Location: 753860
Implantable Lead Model: 3830
Implantable Lead Model: 5076
Implantable Pulse Generator Implant Date: 20200930
Lead Channel Impedance Value: 510 Ohm
Lead Channel Impedance Value: 680 Ohm
Lead Channel Pacing Threshold Amplitude: 0.75 V
Lead Channel Pacing Threshold Amplitude: 0.75 V
Lead Channel Pacing Threshold Pulse Width: 0.4 ms
Lead Channel Pacing Threshold Pulse Width: 1 ms
Lead Channel Sensing Intrinsic Amplitude: 12 mV
Lead Channel Sensing Intrinsic Amplitude: 2.1 mV
Lead Channel Setting Pacing Amplitude: 2 V
Lead Channel Setting Pacing Amplitude: 2.5 V
Lead Channel Setting Pacing Pulse Width: 1 ms
Lead Channel Setting Sensing Sensitivity: 2 mV
Pulse Gen Model: 2272
Pulse Gen Serial Number: 9156742

## 2021-05-28 NOTE — Progress Notes (Signed)
Remote pacemaker transmission.   

## 2021-05-29 ENCOUNTER — Other Ambulatory Visit: Payer: Self-pay | Admitting: Family Medicine

## 2021-05-29 DIAGNOSIS — I10 Essential (primary) hypertension: Secondary | ICD-10-CM

## 2021-06-17 ENCOUNTER — Ambulatory Visit (INDEPENDENT_AMBULATORY_CARE_PROVIDER_SITE_OTHER): Payer: Medicare Other | Admitting: Family Medicine

## 2021-06-17 ENCOUNTER — Ambulatory Visit (INDEPENDENT_AMBULATORY_CARE_PROVIDER_SITE_OTHER): Payer: Medicare Other

## 2021-06-17 ENCOUNTER — Telehealth: Payer: Self-pay

## 2021-06-17 ENCOUNTER — Encounter: Payer: Self-pay | Admitting: Family Medicine

## 2021-06-17 ENCOUNTER — Other Ambulatory Visit: Payer: Self-pay

## 2021-06-17 VITALS — BP 136/88 | HR 60 | Temp 97.8°F | Resp 18 | Ht 62.0 in | Wt 174.2 lb

## 2021-06-17 VITALS — BP 128/78 | HR 60 | Temp 97.5°F | Resp 16 | Ht 62.0 in | Wt 174.0 lb

## 2021-06-17 DIAGNOSIS — Z1159 Encounter for screening for other viral diseases: Secondary | ICD-10-CM | POA: Diagnosis not present

## 2021-06-17 DIAGNOSIS — Z0000 Encounter for general adult medical examination without abnormal findings: Secondary | ICD-10-CM | POA: Diagnosis not present

## 2021-06-17 DIAGNOSIS — M159 Polyosteoarthritis, unspecified: Secondary | ICD-10-CM

## 2021-06-17 DIAGNOSIS — E039 Hypothyroidism, unspecified: Secondary | ICD-10-CM

## 2021-06-17 DIAGNOSIS — M8949 Other hypertrophic osteoarthropathy, multiple sites: Secondary | ICD-10-CM

## 2021-06-17 DIAGNOSIS — I10 Essential (primary) hypertension: Secondary | ICD-10-CM | POA: Diagnosis not present

## 2021-06-17 DIAGNOSIS — Z780 Asymptomatic menopausal state: Secondary | ICD-10-CM

## 2021-06-17 DIAGNOSIS — M8589 Other specified disorders of bone density and structure, multiple sites: Secondary | ICD-10-CM

## 2021-06-17 LAB — COMPREHENSIVE METABOLIC PANEL
ALT: 16 U/L (ref 0–35)
AST: 19 U/L (ref 0–37)
Albumin: 4.5 g/dL (ref 3.5–5.2)
Alkaline Phosphatase: 76 U/L (ref 39–117)
BUN: 23 mg/dL (ref 6–23)
CO2: 28 mEq/L (ref 19–32)
Calcium: 9.7 mg/dL (ref 8.4–10.5)
Chloride: 102 mEq/L (ref 96–112)
Creatinine, Ser: 1.08 mg/dL (ref 0.40–1.20)
GFR: 49.03 mL/min — ABNORMAL LOW (ref >60.00)
Glucose, Bld: 105 mg/dL — ABNORMAL HIGH (ref 70–99)
Potassium: 4.5 mEq/L (ref 3.5–5.1)
Sodium: 140 mEq/L (ref 135–145)
Total Bilirubin: 1.4 mg/dL — ABNORMAL HIGH (ref 0.2–1.2)
Total Protein: 6.6 g/dL (ref 6.0–8.3)

## 2021-06-17 LAB — TSH: TSH: 1.15 u[IU]/mL (ref 0.35–5.50)

## 2021-06-17 LAB — LIPID PANEL
Cholesterol: 168 mg/dL (ref 0–200)
HDL: 46.4 mg/dL (ref >39.00)
LDL Cholesterol: 94 mg/dL (ref 0–99)
NonHDL: 121.29
Total CHOL/HDL Ratio: 4
Triglycerides: 137 mg/dL (ref 0.0–149.0)
VLDL: 27.4 mg/dL (ref 0.0–40.0)

## 2021-06-17 MED ORDER — MELOXICAM 15 MG PO TABS: ORAL_TABLET | ORAL | 1 refills | Status: AC

## 2021-06-17 MED ORDER — SPIRONOLACTONE 25 MG PO TABS: ORAL_TABLET | ORAL | 3 refills | Status: AC

## 2021-06-17 MED ORDER — ESTRADIOL 0.5 MG PO TABS: 0.5000 mg | ORAL_TABLET | Freq: Every day | ORAL | 1 refills | Status: AC

## 2021-06-17 MED ORDER — SYNTHROID 75 MCG PO TABS
75.0000 ug | ORAL_TABLET | Freq: Every day | ORAL | 3 refills | Status: AC
Start: 2021-06-17 — End: ?

## 2021-06-17 NOTE — Assessment & Plan Note (Signed)
D/w pt risks of estrogen--- pt refusing to come off estrogen due to breast cysts  Aware ins may be an issue

## 2021-06-17 NOTE — Assessment & Plan Note (Signed)
con't mobic

## 2021-06-17 NOTE — Patient Instructions (Signed)
Wendy Velazquez , Thank you for taking time to come for your Medicare Wellness Visit. I appreciate your ongoing commitment to your health goals. Please review the following plan we discussed and let me know if I can assist you in the future.   Screening recommendations/referrals: Colonoscopy: No longer required Mammogram: Declined Bone Density: Declined Recommended yearly ophthalmology/optometry visit for glaucoma screening and checkup Recommended yearly dental visit for hygiene and checkup  Vaccinations: Influenza vaccine: Up to date Pneumococcal vaccine: Up to date Tdap vaccine: Up to date-Due-06/06/2023 Shingles vaccine: Completed vaccines   Covid-19:Up to date  Advanced directives: Please bring a copy for your chart  Conditions/risks identified: See problem list  Next appointment: Follow up in one year for your annual wellness visit    Preventive Care 79 Years and Older, Female Preventive care refers to lifestyle choices and visits with your health care provider that can promote health and wellness. What does preventive care include? A yearly physical exam. This is also called an annual well check. Dental exams once or twice a year. Routine eye exams. Ask your health care provider how often you should have your eyes checked. Personal lifestyle choices, including: Daily care of your teeth and gums. Regular physical activity. Eating a healthy diet. Avoiding tobacco and drug use. Limiting alcohol use. Practicing safe sex. Taking low-dose aspirin every day. Taking vitamin and mineral supplements as recommended by your health care provider. What happens during an annual well check? The services and screenings done by your health care provider during your annual well check will depend on your age, overall health, lifestyle risk factors, and family history of disease. Counseling  Your health care provider may ask you questions about your: Alcohol use. Tobacco use. Drug  use. Emotional well-being. Home and relationship well-being. Sexual activity. Eating habits. History of falls. Memory and ability to understand (cognition). Work and work Statistician. Reproductive health. Screening  You may have the following tests or measurements: Height, weight, and BMI. Blood pressure. Lipid and cholesterol levels. These may be checked every 5 years, or more frequently if you are over 61 years old. Skin check. Lung cancer screening. You may have this screening every year starting at age 35 if you have a 30-pack-year history of smoking and currently smoke or have quit within the past 15 years. Fecal occult blood test (FOBT) of the stool. You may have this test every year starting at age 77. Flexible sigmoidoscopy or colonoscopy. You may have a sigmoidoscopy every 5 years or a colonoscopy every 10 years starting at age 53. Hepatitis C blood test. Hepatitis B blood test. Sexually transmitted disease (STD) testing. Diabetes screening. This is done by checking your blood sugar (glucose) after you have not eaten for a while (fasting). You may have this done every 1-3 years. Bone density scan. This is done to screen for osteoporosis. You may have this done starting at age 46. Mammogram. This may be done every 1-2 years. Talk to your health care provider about how often you should have regular mammograms. Talk with your health care provider about your test results, treatment options, and if necessary, the need for more tests. Vaccines  Your health care provider may recommend certain vaccines, such as: Influenza vaccine. This is recommended every year. Tetanus, diphtheria, and acellular pertussis (Tdap, Td) vaccine. You may need a Td booster every 10 years. Zoster vaccine. You may need this after age 47. Pneumococcal 13-valent conjugate (PCV13) vaccine. One dose is recommended after age 18. Pneumococcal polysaccharide (PPSV23) vaccine.  One dose is recommended after age  9. Talk to your health care provider about which screenings and vaccines you need and how often you need them. This information is not intended to replace advice given to you by your health care provider. Make sure you discuss any questions you have with your health care provider. Document Released: 11/29/2015 Document Revised: 07/22/2016 Document Reviewed: 09/03/2015 Elsevier Interactive Patient Education  2017 [place] Prevention in the Home Falls can cause injuries. They can happen to people of all ages. There are many things you can do to make your home safe and to help prevent falls. What can I do on the outside of my home? Regularly fix the edges of walkways and driveways and fix any cracks. Remove anything that might make you trip as you walk through a door, such as a raised step or threshold. Trim any bushes or trees on the path to your home. Use bright outdoor lighting. Clear any walking paths of anything that might make someone trip, such as rocks or tools. Regularly check to see if handrails are loose or broken. Make sure that both sides of any steps have handrails. Any raised decks and porches should have guardrails on the edges. Have any leaves, snow, or ice cleared regularly. Use sand or salt on walking paths during winter. Clean up any spills in your garage right away. This includes oil or grease spills. What can I do in the bathroom? Use night lights. Install grab bars by the toilet and in the tub and shower. Do not use towel bars as grab bars. Use non-skid mats or decals in the tub or shower. If you need to sit down in the shower, use a plastic, non-slip stool. Keep the floor dry. Clean up any water that spills on the floor as soon as it happens. Remove soap buildup in the tub or shower regularly. Attach bath mats securely with double-sided non-slip rug tape. Do not have throw rugs and other things on the floor that can make you trip. What can I do in the  bedroom? Use night lights. Make sure that you have a light by your bed that is easy to reach. Do not use any sheets or blankets that are too big for your bed. They should not hang down onto the floor. Have a firm chair that has side arms. You can use this for support while you get dressed. Do not have throw rugs and other things on the floor that can make you trip. What can I do in the kitchen? Clean up any spills right away. Avoid walking on wet floors. Keep items that you use a lot in easy-to-reach places. If you need to reach something above you, use a strong step stool that has a grab bar. Keep electrical cords out of the way. Do not use floor polish or wax that makes floors slippery. If you must use wax, use non-skid floor wax. Do not have throw rugs and other things on the floor that can make you trip. What can I do with my stairs? Do not leave any items on the stairs. Make sure that there are handrails on both sides of the stairs and use them. Fix handrails that are broken or loose. Make sure that handrails are as long as the stairways. Check any carpeting to make sure that it is firmly attached to the stairs. Fix any carpet that is loose or worn. Avoid having throw rugs at the top or bottom of  the stairs. If you do have throw rugs, attach them to the floor with carpet tape. Make sure that you have a light switch at the top of the stairs and the bottom of the stairs. If you do not have them, ask someone to add them for you. What else can I do to help prevent falls? Wear shoes that: Do not have high heels. Have rubber bottoms. Are comfortable and fit you well. Are closed at the toe. Do not wear sandals. If you use a stepladder: Make sure that it is fully opened. Do not climb a closed stepladder. Make sure that both sides of the stepladder are locked into place. Ask someone to hold it for you, if possible. Clearly mark and make sure that you can see: Any grab bars or  handrails. First and last steps. Where the edge of each step is. Use tools that help you move around (mobility aids) if they are needed. These include: Canes. Walkers. Scooters. Crutches. Turn on the lights when you go into a dark area. Replace any light bulbs as soon as they burn out. Set up your furniture so you have a clear path. Avoid moving your furniture around. If any of your floors are uneven, fix them. If there are any pets around you, be aware of where they are. Review your medicines with your doctor. Some medicines can make you feel dizzy. This can increase your chance of falling. Ask your doctor what other things that you can do to help prevent falls. This information is not intended to replace advice given to you by your health care provider. Make sure you discuss any questions you have with your health care provider. Document Released: 08/29/2009 Document Revised: 04/09/2016 Document Reviewed: 12/07/2014 Elsevier Interactive Patient Education  2017 [employer].

## 2021-06-17 NOTE — Assessment & Plan Note (Signed)
Check labs con't synthroid 

## 2021-06-17 NOTE — Patient Instructions (Addendum)
https://www.nhlbi.nih.gov/files/docs/public/heart/dash_brief.pdf">  DASH Eating Plan DASH stands for Dietary Approaches to Stop Hypertension. The DASH eating plan is a healthy eating plan that has been shown to: Reduce high blood pressure (hypertension). Reduce your risk for type 2 diabetes, heart disease, and stroke. Help with weight loss. What are tips for following this plan? Reading food labels Check food labels for the amount of salt (sodium) per serving. Choose foods with less than 5 percent of the Daily Value of sodium. Generally, foods with less than 300 milligrams (mg) of sodium per serving fit into this eating plan. To find whole grains, look for the word "whole" as the first word in the ingredient list. Shopping Buy products labeled as "low-sodium" or "no salt added." Buy fresh foods. Avoid canned foods and pre-made or frozen meals. Cooking Avoid adding salt when cooking. Use salt-free seasonings or herbs instead of table salt or sea salt. Check with your health care provider or pharmacist before using salt substitutes. Do not fry foods. Cook foods using healthy methods such as baking, boiling, grilling, roasting, and broiling instead. Cook with heart-healthy oils, such as olive, canola, avocado, soybean, or sunflower oil. Meal planning  Eat a balanced diet that includes: 4 or more servings of fruits and 4 or more servings of vegetables each day. Try to fill one-half of your plate with fruits and vegetables. 6-8 servings of whole grains each day. Less than 6 oz (170 g) of lean meat, poultry, or fish each day. A 3-oz (85-g) serving of meat is about the same size as a deck of cards. One egg equals 1 oz (28 g). 2-3 servings of low-fat dairy each day. One serving is 1 cup (237 mL). 1 serving of nuts, seeds, or beans 5 times each week. 2-3 servings of heart-healthy fats. Healthy fats called omega-3 fatty acids are found in foods such as walnuts, flaxseeds, fortified milks, and eggs.  These fats are also found in cold-water fish, such as sardines, salmon, and mackerel. Limit how much you eat of: Canned or prepackaged foods. Food that is high in trans fat, such as some fried foods. Food that is high in saturated fat, such as fatty meat. Desserts and other sweets, sugary drinks, and other foods with added sugar. Full-fat dairy products. Do not salt foods before eating. Do not eat more than 4 egg yolks a week. Try to eat at least 2 vegetarian meals a week. Eat more home-cooked food and less restaurant, buffet, and fast food.  Lifestyle When eating at a restaurant, ask that your food be prepared with less salt or no salt, if possible. If you drink alcohol: Limit how much you use to: 0-1 drink a day for women who are not pregnant. 0-2 drinks a day for men. Be aware of how much alcohol is in your drink. In the [place] one drink equals one 12 oz bottle of beer (355 mL), one 5 oz glass of wine (148 mL), or one 1 oz glass of hard liquor (44 mL). General information Avoid eating more than 2,300 mg of salt a day. If you have hypertension, you may need to reduce your sodium intake to 1,500 mg a day. Work with your health care provider to maintain a healthy body weight or to lose weight. Ask what an ideal weight is for you. Get at least 30 minutes of exercise that causes your heart to beat faster (aerobic exercise) most days of the week. Activities may include walking, swimming, or biking. Work with your health care provider   or dietitian to adjust your eating plan to your individual calorie needs. What foods should I eat? Fruits All fresh, dried, or frozen fruit. Canned fruit in natural juice (without addedsugar). Vegetables Fresh or frozen vegetables (raw, steamed, roasted, or grilled). Low-sodium or reduced-sodium tomato and vegetable juice. Low-sodium or reduced-sodium tomatosauce and tomato paste. Low-sodium or reduced-sodium canned vegetables. Grains Whole-grain or  whole-wheat bread. Whole-grain or whole-wheat pasta. Brown rice. Oatmeal. Quinoa. Bulgur. Whole-grain and low-sodium cereals. Pita bread.Low-fat, low-sodium crackers. Whole-wheat flour tortillas. Meats and other proteins Skinless chicken or turkey. Ground chicken or turkey. Pork with fat trimmed off. Fish and seafood. Egg whites. Dried beans, peas, or lentils. Unsalted nuts, nut butters, and seeds. Unsalted canned beans. Lean cuts of beef with fat trimmed off. Low-sodium, lean precooked or cured meat, such as sausages or meatloaves. Dairy Low-fat (1%) or fat-free (skim) milk. Reduced-fat, low-fat, or fat-free cheeses. Nonfat, low-sodium ricotta or cottage cheese. Low-fat or nonfatyogurt. Low-fat, low-sodium cheese. Fats and oils Soft margarine without trans fats. Vegetable oil. Reduced-fat, low-fat, or light mayonnaise and salad dressings (reduced-sodium). Canola, safflower, olive, avocado, soybean, andsunflower oils. Avocado. Seasonings and condiments Herbs. Spices. Seasoning mixes without salt. Other foods Unsalted popcorn and pretzels. Fat-free sweets. The items listed above may not be a complete list of foods and beverages you can eat. Contact a dietitian for more information. What foods should I avoid? Fruits Canned fruit in a light or heavy syrup. Fried fruit. Fruit in cream or buttersauce. Vegetables Creamed or fried vegetables. Vegetables in a cheese sauce. Regular canned vegetables (not low-sodium or reduced-sodium). Regular canned tomato sauce and paste (not low-sodium or reduced-sodium). Regular tomato and vegetable juice(not low-sodium or reduced-sodium). Pickles. Olives. Grains Baked goods made with fat, such as croissants, muffins, or some breads. Drypasta or rice meal packs. Meats and other proteins Fatty cuts of meat. Ribs. Fried meat. Bacon. Bologna, salami, and other precooked or cured meats, such as sausages or meat loaves. Fat from the back of a pig (fatback). Bratwurst.  Salted nuts and seeds. Canned beans with added salt. Canned orsmoked fish. Whole eggs or egg yolks. Chicken or turkey with skin. Dairy Whole or 2% milk, cream, and half-and-half. Whole or full-fat cream cheese. Whole-fat or sweetened yogurt. Full-fat cheese. Nondairy creamers. Whippedtoppings. Processed cheese and cheese spreads. Fats and oils Butter. Stick margarine. Lard. Shortening. Ghee. Bacon fat. Tropical oils, suchas coconut, palm kernel, or palm oil. Seasonings and condiments Onion salt, garlic salt, seasoned salt, table salt, and sea salt. Worcestershire sauce. Tartar sauce. Barbecue sauce. Teriyaki sauce. Soy sauce, including reduced-sodium. Steak sauce. Canned and packaged gravies. Fish sauce. Oyster sauce. Cocktail sauce. Store-bought horseradish. Ketchup. Mustard. Meat flavorings and tenderizers. Bouillon cubes. Hot sauces. Pre-made or packaged marinades. Pre-made or packaged taco seasonings. Relishes. Regular saladdressings. Other foods Salted popcorn and pretzels. The items listed above may not be a complete list of foods and beverages you should avoid. Contact a dietitian for more information. Where to find more information National Heart, Lung, and Blood Institute: www.nhlbi.nih.gov American Heart Association: www.heart.org Academy of Nutrition and Dietetics: www.eatright.org National Kidney Foundation: www.kidney.org Summary The DASH eating plan is a healthy eating plan that has been shown to reduce high blood pressure (hypertension). It may also reduce your risk for type 2 diabetes, heart disease, and stroke. When on the DASH eating plan, aim to eat more fresh fruits and vegetables, whole grains, lean proteins, low-fat dairy, and heart-healthy fats. With the DASH eating plan, you should limit salt (sodium) intake to 2,300   mg a day. If you have hypertension, you may need to reduce your sodium intake to 1,500 mg a day. Work with your health care provider or dietitian to adjust  your eating plan to your individual calorie needs. This information is not intended to replace advice given to you by your health care provider. Make sure you discuss any questions you have with your healthcare provider. Document Revised: 10/06/2019 Document Reviewed: 10/06/2019 Elsevier Patient Education  2022 Elsevier Inc.  

## 2021-06-17 NOTE — Progress Notes (Signed)
Subjective:   Wendy Velazquez is a 80 y.o. female who presents for Medicare Annual (Subsequent) preventive examination.  Review of Systems     Cardiac Risk Factors include: advanced age (>18mn, >>50women);dyslipidemia;hypertension;obesity (BMI >30kg/m2)     Objective:    Today's Vitals   06/17/21 0928  BP: 128/78  Pulse: 60  Resp: 16  Temp: (!) 97.5 F (36.4 C)  TempSrc: Temporal  SpO2: 97%  Weight: 174 lb (78.9 kg)  Height: '5\' 2"'$  (1.575 m)   Body mass index is 31.83 kg/m.  Advanced Directives 06/17/2021 06/11/2020 12/15/2019 08/16/2019 08/15/2019 06/06/2019 05/05/2018  Does Patient Have a Medical Advance Directive? Yes Yes Yes Yes Yes Yes Yes  Type of AParamedicof AAllison [place]Living will HCedar[place]Living will HSt. [place]Living will Healthcare Power of ASanta MargaritaLiving will HWilloughby HillsLiving will  Does patient want to make changes to medical advance directive? - No - Patient declined - No - Patient declined - No - Patient declined -  Copy of HRenoin Chart? No - copy requested No - copy requested - No - copy requested - No - copy requested No - copy requested    Current Medications (verified) Outpatient Encounter Medications as of 06/17/2021  Medication Sig   acetaminophen (TYLENOL) 650 MG CR tablet Take 650 mg by mouth 2 (two) times daily.   amLODipine (NORVASC) 5 MG tablet TAKE 1 TABLET DAILY   ARTIFICIAL TEAR SOLUTION OP Place 1 drop into both eyes daily as needed (irritation).   Cholecalciferol (VITAMIN D3) 50 MCG (2000 UT) capsule Take 2,000 Units by mouth daily.   COD LIVER OIL PO Take by mouth.   docusate sodium (COLACE) 100 MG capsule Take 200 mg by mouth daily as needed for mild constipation.   estradiol (ESTRACE) 0.5 MG tablet Take 1 tablet (0.5 mg total) by mouth daily.   guaifenesin (HUMIBID E) 400 MG TABS tablet Take 400 mg by mouth at  bedtime.   Homeopathic Products ([facility] RELIEF NA) Place 1 spray into the nose daily as needed (congestion).   Lactobacillus (AZO COMPLETE FEMININE BALANCE) CAPS Take 1 capsule by mouth daily.   medroxyPROGESTERone (PROVERA) 5 MG tablet 1 tab po qd 10 days a month as directed.  Brand name necessary.   meloxicam (MOBIC) 15 MG tablet 1/2-1 po qd prn   Misc Natural Products (GLUCOSAMINE CHOND COMPLEX/MSM) TABS Take 1 tablet by mouth 2 (two) times daily.   propranolol (INDERAL) 10 MG tablet TAKE 1 TABLET TWICE A DAY   sodium chloride (OCEAN) 0.65 % SOLN nasal spray Place 1 spray into both nostrils at bedtime as needed for congestion.   spironolactone (ALDACTONE) 25 MG tablet 1/2 tab po qd   SYNTHROID 75 MCG tablet Take 1 tablet (75 mcg total) by mouth daily.   No facility-administered encounter medications on file as of 06/17/2021.    Allergies (verified) Antihistamines, diphenhydramine-type; Codeine; Hydrocodone-acetaminophen; Lactose intolerance (gi); Tramadol hcl; Gabapentin; and Vitamin d analogs   History: Past Medical History:  Diagnosis Date   Arthritis    Complication of anesthesia    HEARING LOSS, BILATERAL 03/02/2008   Qualifier: Diagnosis of  By: LJerold Coombe    Hx of cyst of breast 07/21/2012   Hypertension    Hypothyroidism 03/02/2008   Qualifier: Diagnosis of  By: LJerold Coombe    Melanoma ([facility]    Mobitz type II atrioventricular block 08/15/2019  MRSA (methicillin resistant staph aureus) culture positive    Osteopenia    PONV (postoperative nausea and vomiting)    Rapid heart beat    benigh   Transitional cell carcinoma (HCC)    Vitamin D deficiency 09/12/2019   Past Surgical History:  Procedure Laterality Date   ABDOMINAL HYSTERECTOMY  1987   partial   ANTERIOR LAT LUMBAR FUSION Left 12/19/2019   Procedure: Left Lumbar Two-Three, Lumbar Three-Four, Lumbar Four-Five Anterolateral lumbar Interbody Fusion;  Surgeon: Erline Levine, MD;  Location: [place];   Service: Neurosurgery;  Laterality: Left;  Left Lumbar 2-3 Lumbar 3-4 Lumbar 4-5 Anterolateral lumbar interbody fusion with percutaneous pedicle screws   BACK SURGERY     08/02/2018   CATARACT EXTRACTION  2004   EYE SURGERY     cataract b/l   LUMBAR PERCUTANEOUS PEDICLE SCREW 3 LEVEL N/A 12/19/2019   Procedure: Lumbar Two-Three, Lumbar Three-Four, Lumbar Four-Five Percutaneous Pedicle Screw Placement;  Surgeon: Erline Levine, MD;  Location: [place];  Service: Neurosurgery;  Laterality: N/A;   MELANOMA EXCISION  1977   left leg   MELANOMA EXCISION  06-24-09   r foot   PACEMAKER IMPLANT N/A 08/16/2019   Procedure: PACEMAKER IMPLANT;  Surgeon: Evans Lance, MD;  Location: [facility];  Service: Cardiovascular;  Laterality: N/A;   SKIN CANCER EXCISION  1999   bladder transitional cell   teeth implants     TONSILLECTOMY     Family History  Problem Relation Age of Onset   Diabetes Mother    Alzheimer's disease Mother    Dementia Mother    Stroke Father    Hypertension Father    Social History   Socioeconomic History   Marital status: Widowed    Spouse name: Not on file   Number of children: Not on file   Years of education: Not on file   Highest education level: Not on file  Occupational History   Occupation: accounting    Employer: [employer]    Comment: 4 days a week  Tobacco Use   Smoking status: Former    Packs/day: 0.30    Years: 15.00    Pack years: 4.50    Types: Cigarettes    Quit date: 03/15/1981    Years since quitting: 40.2   Smokeless tobacco: Never  Vaping Use   Vaping Use: Never used  Substance and Sexual Activity   Alcohol use: Yes    Alcohol/week: 1.0 - 2.0 standard drink    Types: 1 - 2 Glasses of wine per week   Drug use: No   Sexual activity: Not Currently    Partners: Male  Other Topics Concern   Not on file  Social History Narrative   Exercise--- dancing   Social Determinants of Health   Financial Resource Strain: Low Risk     Difficulty of Paying Living Expenses: Not hard at all  Food Insecurity: No Food Insecurity   Worried About Charity fundraiser in the Last Year: Never true   [place] in the Last Year: Never true  Transportation Needs: No Transportation Needs   Lack of Transportation (Medical): No   Lack of Transportation (Non-Medical): No  Physical Activity: Inactive   Days of Exercise per Week: 0 days   Minutes of Exercise per Session: 60 min  Stress: No Stress Concern Present   Feeling of Stress : Not at all  Social Connections: Moderately Isolated   Frequency of Communication with Friends  and Family: More than three times a week   Frequency of Social Gatherings with Friends and Family: More than three times a week   Attends Religious Services: Never   Marine scientist or Organizations: Yes   Attends Music therapist: More than 4 times per year   Marital Status: Widowed    Tobacco Counseling Counseling given: Not Answered   Clinical Intake:  Pre-visit preparation completed: Yes  Pain : No/denies pain     Nutritional Status: BMI > 30  Obese Nutritional Risks: None Diabetes: No  How often do you need to have someone help you when you read instructions, pamphlets, or other written materials from your doctor or pharmacy?: 1 - Never  Diabetic?No  Interpreter Needed?: No  Information entered by :: Caroleen Hamman LPN   Activities of Daily Living In your present state of health, do you have any difficulty performing the following activities: 06/17/2021  Hearing? N  Vision? N  Difficulty concentrating or making decisions? N  Walking or climbing stairs? N  Dressing or bathing? N  Doing errands, shopping? N  Preparing Food and eating ? N  Using the Toilet? N  In the past six months, have you accidently leaked urine? N  Do you have problems with loss of bowel control? N  Managing your Medications? N  Managing your Finances? N  Housekeeping or managing your  Housekeeping? N  Some recent data might be hidden    Patient Care Team: Carollee Herter, Alferd Apa, DO as PCP - General Luberta Mutter, MD as Consulting Physician (Ophthalmology) Myrlene Broker, MD as Attending Physician (Urology) Rolm Bookbinder, MD as Consulting Physician (Dermatology) Myrle Sheng, MD as Referring Physician (Neurosurgery)  Indicate any recent Medical Services you may have received from other than [facility] providers in the past year (date may be approximate).     Assessment:   This is a routine wellness examination for [place].  Hearing/Vision screen Hearing Screening - Comments:: Bilateral hearing aids Vision Screening - Comments:: Last eye exam-01/2021-Dr. McCuen  Dietary issues and exercise activities discussed: Current Exercise Habits: Structured exercise class, Type of exercise: Other - see comments (dancing), Time (Minutes): 60, Frequency (Times/Week): 1, Weekly Exercise (Minutes/Week): 60, Intensity: Mild, Exercise limited by: None identified   Goals Addressed             This Visit's Progress    Maintain current health   On track      Depression Screen PHQ 2/9 Scores 06/17/2021 06/11/2020 06/06/2019 05/05/2018 05/04/2017 04/21/2016 08/16/2015  PHQ - 2 Score 0 0 0 0 0 0 0  Exception Documentation - - - - - - -    Fall Risk Fall Risk  06/17/2021 06/11/2020 06/06/2019 05/05/2018 05/04/2017  Falls in the past year? 0 0 0 No No  Number falls in past yr: 0 0 - - -  Injury with Fall? 0 0 - - -  Follow up Falls prevention discussed Education provided;Falls prevention discussed - - -    FALL RISK PREVENTION PERTAINING TO THE HOME:  Any stairs in or around the home? No  Home free of loose throw rugs in walkways, pet beds, electrical cords, etc? Yes  Adequate lighting in your home to reduce risk of falls? Yes   ASSISTIVE DEVICES UTILIZED TO PREVENT FALLS:  Life alert? No  Use of a cane, walker or w/c? No  Grab bars in the bathroom? Yes  Shower chair or  bench in shower? No  Elevated toilet  seat or a handicapped toilet? No   TIMED UP AND GO:  Was the test performed? Yes .  Length of time to ambulate 10 feet: 10 sec.   Gait steady and fast without use of assistive device  Cognitive Function:Normal cognitive status assessed by direct observation by this Nurse Health Advisor. No abnormalities found.   MMSE - Mini Mental State Exam 05/04/2017 04/21/2016  Orientation to time 5 5  Orientation to Place 5 5  Registration 3 3  Attention/ Calculation 5 5  Recall 3 3  Language- name 2 objects 2 2  Language- repeat 1 1  Language- follow 3 step command 3 3  Language- read & follow direction 1 1  Write a sentence 1 1  Copy design 1 1  Total score 30 30        Immunizations Immunization History  Administered Date(s) Administered   DTaP 06/05/2013   Fluad Quad(high Dose 65+) 07/11/2019   Influenza Split 07/23/2020   Influenza Whole 09/12/2008   Influenza, High Dose Seasonal PF 08/29/2014, 07/12/2018   Influenza-Unspecified 11/30/2012, 09/16/2013, 08/17/2016   PFIZER(Purple Top)SARS-COV-2 Vaccination 01/18/2020, 02/14/2020, 10/29/2020, 05/30/2021   PPD Test 02/11/2016, 02/18/2016   Pneumococcal Conjugate-13 04/21/2016   Pneumococcal Polysaccharide-23 03/02/2008   Td 11/29/2002   Tdap 06/05/2013   Zoster Recombinat (Shingrix) 10/03/2019, 03/12/2020   Zoster, Live 03/02/2008    TDAP status: Up to date  Flu Vaccine status: Up to date  Pneumococcal vaccine status: Up to date  Covid-19 vaccine status: Completed vaccines  Qualifies for Shingles Vaccine? No   Zostavax completed Yes   Shingrix Completed?: Yes  Screening Tests Health Maintenance  Topic Date Due   Hepatitis C Screening  Never done   MAMMOGRAM  02/13/2015   INFLUENZA VACCINE  06/16/2021   COVID-19 Vaccine (5 - Booster for Pfizer series) 09/30/2021   TETANUS/TDAP  06/06/2023   DEXA SCAN  Completed   PNA vac Low Risk Adult  Completed   Zoster Vaccines- Shingrix   Completed   HPV VACCINES  Aged Out    Health Maintenance  Health Maintenance Due  Topic Date Due   Hepatitis C Screening  Never done   MAMMOGRAM  02/13/2015   INFLUENZA VACCINE  06/16/2021    Colorectal cancer screening: No longer required.   Mammogram status: Declined  Bone Density status: Declined  Lung Cancer Screening: (Low Dose CT Chest recommended if Age 28-80 years, 30 pack-year currently smoking OR have quit w/in 15years.) does not qualify.     Additional Screening:  Hepatitis C Screening: does not qualify  Vision Screening: Recommended annual ophthalmology exams for early detection of glaucoma and other disorders of the eye. Is the patient up to date with their annual eye exam?  Yes  Who is the provider or what is the name of the office in which the patient attends annual eye exams? Dr. Daisy Floro   Dental Screening: Recommended annual dental exams for proper oral hygiene  Community Resource Referral / Chronic Care Management: CRR required this visit?  No   CCM required this visit?  No      Plan:     I have personally reviewed and noted the following in the patient's chart:   Medical and social history Use of alcohol, tobacco or illicit drugs  Current medications and supplements including opioid prescriptions.  Functional ability and status Nutritional status Physical activity Advanced directives List of other physicians Hospitalizations, surgeries, and ER visits in previous 12 months Vitals Screenings to include cognitive, depression,  and falls Referrals and appointments  In addition, I have reviewed and discussed with patient certain preventive protocols, quality metrics, and best practice recommendations. A written personalized care plan for preventive services as well as general preventive health recommendations were provided to patient.     Marta Antu, LPN   [id number]  Nurse Health Advisor  Nurse Notes: None

## 2021-06-17 NOTE — Progress Notes (Signed)
Subjective:   By signing my name below, I, [name], attest that this documentation has been prepared under the direction and in the presence of Dr. Roma Schanz, DO. 06/17/2021    Patient ID: Wendy Velazquez, female    DOB: Apr 30, 1941, 80 y.o.   MRN: [id number]  Chief Complaint  Patient presents with   Hypertension   Hypothyroidism   Follow-up    HPI Patient is in today for a comprehensive physical exam.  She continues taking 0.5 mg estrace daily PO to manage a mass in her breast. She has stopped seeing her neurosurgeon.  Her blood pressure is doing well during this visit. She continues taking 5 mg amlodipine daily PO, 10 mg inderal 2x daily PO, 12.5 mg aldactone daily PO and reports no new issues while taking it.   BP Readings from Last 3 Encounters:  06/17/21 136/88  06/17/21 128/78  02/18/21 (!) 150/82   She denies having any fever, ear pain, congestion, sinus pain, sore throat, eye pain, chest pain, palpations, cough, SOB, wheezing, n/v/d, constipation, blood in stool, dysuria, frequency, hematuria, or headaches at this time. She no longer wants to have routine mammograms. She no longer wants a routine bone density scan.   Past Medical History:  Diagnosis Date   Arthritis    Complication of anesthesia    HEARING LOSS, BILATERAL 03/02/2008   Qualifier: Diagnosis of  By: Jerold Coombe     Hx of cyst of breast 07/21/2012   Hypertension    Hypothyroidism 03/02/2008   Qualifier: Diagnosis of  By: Jerold Coombe     Melanoma [facility])    Mobitz type II atrioventricular block 08/15/2019   MRSA (methicillin resistant staph aureus) culture positive    Osteopenia    PONV (postoperative nausea and vomiting)    Rapid heart beat    benigh   Transitional cell carcinoma ([place])    Vitamin D deficiency 09/12/2019    Past Surgical History:  Procedure Laterality Date   ABDOMINAL HYSTERECTOMY  1987   partial   ANTERIOR LAT LUMBAR FUSION Left 12/19/2019   Procedure:  Left Lumbar Two-Three, Lumbar Three-Four, Lumbar Four-Five Anterolateral lumbar Interbody Fusion;  Surgeon: Erline Levine, MD;  Location: [place];  Service: Neurosurgery;  Laterality: Left;  Left Lumbar 2-3 Lumbar 3-4 Lumbar 4-5 Anterolateral lumbar interbody fusion with percutaneous pedicle screws   BACK SURGERY     08/02/2018   CATARACT EXTRACTION  2004   EYE SURGERY     cataract b/l   LUMBAR PERCUTANEOUS PEDICLE SCREW 3 LEVEL N/A 12/19/2019   Procedure: Lumbar Two-Three, Lumbar Three-Four, Lumbar Four-Five Percutaneous Pedicle Screw Placement;  Surgeon: Erline Levine, MD;  Location: [place];  Service: Neurosurgery;  Laterality: N/A;   MELANOMA EXCISION  1977   left leg   MELANOMA EXCISION  06-24-09   r foot   PACEMAKER IMPLANT N/A 08/16/2019   Procedure: PACEMAKER IMPLANT;  Surgeon: Evans Lance, MD;  Location: [place] [facility] LAB;  Service: Cardiovascular;  Laterality: N/A;   SKIN CANCER EXCISION  1999   bladder transitional cell   teeth implants     TONSILLECTOMY      Family History  Problem Relation Age of Onset   Diabetes Mother    Alzheimer's disease Mother    Dementia Mother    Stroke Father    Hypertension Father     Social History   Socioeconomic History   Marital status: Widowed    Spouse name: Not on file  Number of children: Not on file   Years of education: Not on file   Highest education level: Not on file  Occupational History   Occupation: accounting    Employer: [employer]    Comment: 4 days a week  Tobacco Use   Smoking status: Former    Packs/day: 0.30    Years: 15.00    Pack years: 4.50    Types: Cigarettes    Quit date: 03/15/1981    Years since quitting: 40.2   Smokeless tobacco: Never  Vaping Use   Vaping Use: Never used  Substance and Sexual Activity   Alcohol use: Yes    Alcohol/week: 1.0 - 2.0 standard drink    Types: 1 - 2 Glasses of wine per week   Drug use: No   Sexual activity: Not Currently    Partners: Male  Other Topics  Concern   Not on file  Social History Narrative   Exercise--- dancing   Social Determinants of Health   Financial Resource Strain: Low Risk    Difficulty of Paying Living Expenses: Not hard at all  Food Insecurity: No Food Insecurity   Worried About Charity fundraiser in the Last Year: Never true   [place] in the Last Year: Never true  Transportation Needs: No Transportation Needs   Lack of Transportation (Medical): No   Lack of Transportation (Non-Medical): No  Physical Activity: Inactive   Days of Exercise per Week: 0 days   Minutes of Exercise per Session: 60 min  Stress: No Stress Concern Present   Feeling of Stress : Not at all  Social Connections: Moderately Isolated   Frequency of Communication with Friends and Family: More than three times a week   Frequency of Social Gatherings with Friends and Family: More than three times a week   Attends Religious Services: Never   Marine scientist or Organizations: Yes   Attends Music therapist: More than 4 times per year   Marital Status: Widowed  Human resources officer Violence: Not At Risk   Fear of Current or Ex-Partner: No   Emotionally Abused: No   Physically Abused: No   Sexually Abused: No    Outpatient Medications Prior to Visit  Medication Sig Dispense Refill   acetaminophen (TYLENOL) 650 MG CR tablet Take 650 mg by mouth 2 (two) times daily.     amLODipine (NORVASC) 5 MG tablet TAKE 1 TABLET DAILY 90 tablet 1   ARTIFICIAL TEAR SOLUTION OP Place 1 drop into both eyes daily as needed (irritation).     Cholecalciferol (VITAMIN D3) 50 MCG (2000 UT) capsule Take 2,000 Units by mouth daily.     COD LIVER OIL PO Take by mouth.     docusate sodium (COLACE) 100 MG capsule Take 200 mg by mouth daily as needed for mild constipation.     guaifenesin (HUMIBID E) 400 MG TABS tablet Take 400 mg by mouth at bedtime.     Homeopathic Products [facility] SINUS RELIEF NA) Place 1 spray into the nose daily as  needed (congestion).     Lactobacillus (AZO COMPLETE FEMININE BALANCE) CAPS Take 1 capsule by mouth daily.     medroxyPROGESTERone (PROVERA) 5 MG tablet 1 tab po qd 10 days a month as directed.  Brand name necessary. 90 tablet 3   Misc Natural Products (GLUCOSAMINE CHOND COMPLEX/MSM) TABS Take 1 tablet by mouth 2 (two) times daily.     propranolol (INDERAL) 10 MG tablet TAKE  1 TABLET TWICE A DAY 180 tablet 1   sodium chloride (OCEAN) 0.65 % SOLN nasal spray Place 1 spray into both nostrils at bedtime as needed for congestion.     estradiol (ESTRACE) 0.5 MG tablet Take 1 tablet (0.5 mg total) by mouth daily. 90 tablet 1   meloxicam (MOBIC) 15 MG tablet 1/2-1 po qd prn 90 tablet 1   spironolactone (ALDACTONE) 25 MG tablet 1/2 tab po qd 45 tablet 3   SYNTHROID 75 MCG tablet Take 1 tablet (75 mcg total) by mouth daily. 90 tablet 3   No facility-administered medications prior to visit.    Allergies  Allergen Reactions   Antihistamines, Diphenhydramine-Type Tinitus   Codeine Nausea Only    Can tolerate hydrocodone   Hydrocodone-Acetaminophen Other (See Comments)   Lactose Intolerance (Gi) Diarrhea    Stomach pain    Tramadol Hcl    Gabapentin Rash   Vitamin D Analogs Palpitations    Review of Systems  Constitutional:  Negative for fever.  HENT:  Negative for congestion, ear pain, sinus pain and sore throat.   Eyes:  Negative for pain.  Respiratory:  Negative for cough, shortness of breath and wheezing.   Cardiovascular:  Negative for chest pain and palpitations.  Gastrointestinal:  Negative for blood in stool, constipation, diarrhea, nausea and vomiting.  Genitourinary:  Negative for dysuria, frequency and hematuria.  Neurological:  Negative for headaches.  Psychiatric/Behavioral:  Negative for depression. The patient is not nervous/anxious.       Objective:    Physical Exam Constitutional:      General: She is not in acute distress.    Appearance: Normal appearance. She is not  ill-appearing.  HENT:     Head: Normocephalic and atraumatic.     Right Ear: Tympanic membrane, ear canal and external ear normal.     Left Ear: Tympanic membrane, ear canal and external ear normal.  Eyes:     Extraocular Movements: Extraocular movements intact.     Pupils: Pupils are equal, round, and reactive to light.  Cardiovascular:     Rate and Rhythm: Normal rate and regular rhythm.     Heart sounds: Normal heart sounds. No murmur heard.   No gallop.  Pulmonary:     Effort: Pulmonary effort is normal. No respiratory distress.     Breath sounds: Normal breath sounds. No wheezing or rales.  Abdominal:     General: Bowel sounds are normal. There is no distension.     Palpations: Abdomen is soft. There is no mass.     Tenderness: There is no abdominal tenderness. There is no guarding or rebound.  Skin:    General: Skin is warm and dry.  Neurological:     Mental Status: She is alert and oriented to person, place, and time.  Psychiatric:        Behavior: Behavior normal.    BP 136/88 (BP Location: Right Arm, Patient Position: Sitting, Cuff Size: Large)   Pulse 60   Temp 97.8 F (36.6 C) (Oral)   Resp 18   Ht '5\' 2"'$  (1.575 m)   Wt 174 lb 3.2 oz (79 kg)   SpO2 95%   BMI 31.86 kg/m  Wt Readings from Last 3 Encounters:  06/17/21 174 lb 3.2 oz (79 kg)  06/17/21 174 lb (78.9 kg)  02/18/21 174 lb (78.9 kg)    Diabetic Foot Exam - Simple   No data filed    Lab Results  Component Value Date   WBC  6.2 12/15/2019   HGB 15.0 12/15/2019   HCT 47.8 (H) 12/15/2019   PLT 214 12/15/2019   GLUCOSE 110 (H) 01/21/2021   CHOL 170 01/21/2021   TRIG 162.0 (H) 01/21/2021   HDL 48.90 01/21/2021   LDLDIRECT 99.0 09/12/2019   LDLCALC 89 01/21/2021   ALT 16 01/21/2021   AST 18 01/21/2021   NA 140 01/21/2021   K 4.2 01/21/2021   CL 101 01/21/2021   CREATININE 1.10 01/21/2021   BUN 26 (H) 01/21/2021   CO2 31 01/21/2021   TSH 1.55 01/21/2021   MICROALBUR 2.0 (H) 04/18/2015     Lab Results  Component Value Date   TSH 1.55 01/21/2021   Lab Results  Component Value Date   WBC 6.2 12/15/2019   HGB 15.0 12/15/2019   HCT 47.8 (H) 12/15/2019   MCV 95.8 12/15/2019   PLT 214 12/15/2019   Lab Results  Component Value Date   NA 140 01/21/2021   K 4.2 01/21/2021   CO2 31 01/21/2021   GLUCOSE 110 (H) 01/21/2021   BUN 26 (H) 01/21/2021   CREATININE 1.10 01/21/2021   BILITOT 1.4 (H) 01/21/2021   ALKPHOS 81 01/21/2021   AST 18 01/21/2021   ALT 16 01/21/2021   PROT 6.7 01/21/2021   ALBUMIN 4.5 01/21/2021   CALCIUM 9.8 01/21/2021   ANIONGAP 6 12/15/2019   GFR 48.09 (L) 01/21/2021   Lab Results  Component Value Date   CHOL 170 01/21/2021   Lab Results  Component Value Date   HDL 48.90 01/21/2021   Lab Results  Component Value Date   LDLCALC 89 01/21/2021   Lab Results  Component Value Date   TRIG 162.0 (H) 01/21/2021   Lab Results  Component Value Date   CHOLHDL 3 01/21/2021   No results found for: HGBA1C  Mammogram- Last completed 02/12/2014. Results normal. Repeat in 1 year.  Dexa- Last completed 06/14/2019. Results showed she is osteopenic. Repeat in 2 years.  Colonoscopy- Last completed 06/02/2007.      Assessment & Plan:   Problem List Items Addressed This Visit       Unprioritized   Essential hypertension    Well controlled, no changes to meds. Encouraged heart healthy diet such as the DASH diet and exercise as tolerated.        Relevant Medications   spironolactone (ALDACTONE) 25 MG tablet   Other Relevant Orders   Lipid panel   Comprehensive metabolic panel   TSH   Hypothyroidism    Check labs con't synthroid        Relevant Medications   SYNTHROID 75 MCG tablet   Other Relevant Orders   TSH   Menopause    D/w pt risks of estrogen--- pt refusing to come off estrogen due to breast cysts  Aware ins may be an issue       Relevant Medications   estradiol (ESTRACE) 0.5 MG tablet   Osteopenia of multiple  sites    Pt refusing bone density and meds Will con't ca and vita d       Primary osteoarthritis involving multiple joints    con't mobic       Relevant Medications   meloxicam (MOBIC) 15 MG tablet   Other Visit Diagnoses     Need for hepatitis C screening test    -  Primary   Relevant Orders   Hepatitis C antibody        Meds ordered this encounter  Medications   spironolactone (ALDACTONE) 25  MG tablet    Sig: 1/2 tab po qd    Dispense:  45 tablet    Refill:  3   SYNTHROID 75 MCG tablet    Sig: Take 1 tablet (75 mcg total) by mouth daily.    Dispense:  90 tablet    Refill:  3   meloxicam (MOBIC) 15 MG tablet    Sig: 1/2-1 po qd prn    Dispense:  90 tablet    Refill:  1   estradiol (ESTRACE) 0.5 MG tablet    Sig: Take 1 tablet (0.5 mg total) by mouth daily.    Dispense:  90 tablet    Refill:  1    I, Dr. Roma Schanz, DO. 06/17/2021 , personally preformed the services described in this documentation.  All medical record entries made by the scribe were at my direction and in my presence.  I have reviewed the chart and discharge instructions (if applicable) and agree that the record reflects my personal performance and is accurate and complete. 06/17/2021   I,[name],acting as a scribe for Ann Held, DO.,have documented all relevant documentation on the behalf of Ann Held, DO,as directed by  Ann Held, DO while in the presence of Ann Held, DO.   Ann Held, DO

## 2021-06-17 NOTE — Telephone Encounter (Signed)
FYI- Pt called in very upset because she thought you sent in a generic synthroid. I told her to sent in the correct medication. Pt was upset that we could not send in a year worth of medication. Pt states that she was not introduced to someone that was in the room(your scribe) and she doesn't appreciate that. I told her that was the scribe and they help with taking notes and I apologized for that.

## 2021-06-17 NOTE — Assessment & Plan Note (Signed)
Pt refusing bone density and meds Will con't ca and vita d

## 2021-06-17 NOTE — Assessment & Plan Note (Signed)
Well controlled, no changes to meds. Encouraged heart healthy diet such as the DASH diet and exercise as tolerated.  °

## 2021-06-18 LAB — HEPATITIS C ANTIBODY
Hepatitis C Ab: NONREACTIVE
SIGNAL TO CUT-OFF: 0 (ref <1.00)

## 2021-06-20 ENCOUNTER — Encounter: Payer: Self-pay | Admitting: Family Medicine

## 2021-06-23 ENCOUNTER — Other Ambulatory Visit (INDEPENDENT_AMBULATORY_CARE_PROVIDER_SITE_OTHER): Payer: Medicare Other

## 2021-06-23 ENCOUNTER — Encounter: Payer: Self-pay | Admitting: Family Medicine

## 2021-06-23 DIAGNOSIS — E559 Vitamin D deficiency, unspecified: Secondary | ICD-10-CM | POA: Diagnosis not present

## 2021-06-23 LAB — VITAMIN D 25 HYDROXY (VIT D DEFICIENCY, FRACTURES): VITD: 39.47 ng/mL (ref 30.00–100.00)

## 2021-06-23 NOTE — Addendum Note (Signed)
Addended by: Octavio Manns E on: 06/23/2021 04:52 PM   Modules accepted: Orders

## 2021-07-28 DIAGNOSIS — D1801 Hemangioma of skin and subcutaneous tissue: Secondary | ICD-10-CM | POA: Diagnosis not present

## 2021-07-28 DIAGNOSIS — L821 Other seborrheic keratosis: Secondary | ICD-10-CM | POA: Diagnosis not present

## 2021-07-28 DIAGNOSIS — Z85820 Personal history of malignant melanoma of skin: Secondary | ICD-10-CM | POA: Diagnosis not present

## 2021-08-13 ENCOUNTER — Ambulatory Visit (INDEPENDENT_AMBULATORY_CARE_PROVIDER_SITE_OTHER): Payer: Medicare Other

## 2021-08-13 DIAGNOSIS — I441 Atrioventricular block, second degree: Secondary | ICD-10-CM | POA: Diagnosis not present

## 2021-08-14 LAB — CUP PACEART REMOTE DEVICE CHECK
Battery Remaining Longevity: 76 mo
Battery Remaining Percentage: 77 %
Battery Voltage: 2.99 V
Brady Statistic AP VP Percent: 51 %
Brady Statistic AP VS Percent: 1 %
Brady Statistic AS VP Percent: 49 %
Brady Statistic AS VS Percent: 1 %
Brady Statistic RA Percent Paced: 50 %
Brady Statistic RV Percent Paced: 99 %
Date Time Interrogation Session: 20220928020032
Implantable Lead Implant Date: 20200930
Implantable Lead Implant Date: 20200930
Implantable Lead Location: 753859
Implantable Lead Location: 753860
Implantable Lead Model: 3830
Implantable Lead Model: 5076
Implantable Pulse Generator Implant Date: 20200930
Lead Channel Impedance Value: 530 Ohm
Lead Channel Impedance Value: 680 Ohm
Lead Channel Pacing Threshold Amplitude: 0.75 V
Lead Channel Pacing Threshold Amplitude: 0.75 V
Lead Channel Pacing Threshold Pulse Width: 0.4 ms
Lead Channel Pacing Threshold Pulse Width: 1 ms
Lead Channel Sensing Intrinsic Amplitude: 1.3 mV
Lead Channel Sensing Intrinsic Amplitude: 12 mV
Lead Channel Setting Pacing Amplitude: 2 V
Lead Channel Setting Pacing Amplitude: 2.5 V
Lead Channel Setting Pacing Pulse Width: 1 ms
Lead Channel Setting Sensing Sensitivity: 2 mV
Pulse Gen Model: 2272
Pulse Gen Serial Number: 9156742

## 2021-08-19 DIAGNOSIS — Z85820 Personal history of malignant melanoma of skin: Secondary | ICD-10-CM | POA: Diagnosis not present

## 2021-08-19 DIAGNOSIS — L738 Other specified follicular disorders: Secondary | ICD-10-CM | POA: Diagnosis not present

## 2021-08-19 DIAGNOSIS — L570 Actinic keratosis: Secondary | ICD-10-CM | POA: Diagnosis not present

## 2021-08-19 DIAGNOSIS — D2372 Other benign neoplasm of skin of left lower limb, including hip: Secondary | ICD-10-CM | POA: Diagnosis not present

## 2021-08-19 DIAGNOSIS — D1801 Hemangioma of skin and subcutaneous tissue: Secondary | ICD-10-CM | POA: Diagnosis not present

## 2021-08-19 DIAGNOSIS — L308 Other specified dermatitis: Secondary | ICD-10-CM | POA: Diagnosis not present

## 2021-08-19 DIAGNOSIS — L821 Other seborrheic keratosis: Secondary | ICD-10-CM | POA: Diagnosis not present

## 2021-08-20 NOTE — Progress Notes (Signed)
Remote pacemaker transmission.   

## 2021-09-16 DIAGNOSIS — Z23 Encounter for immunization: Secondary | ICD-10-CM | POA: Diagnosis not present

## 2021-09-25 DIAGNOSIS — Z20822 Contact with and (suspected) exposure to covid-19: Secondary | ICD-10-CM | POA: Diagnosis not present

## 2021-10-21 DIAGNOSIS — M2042 Other hammer toe(s) (acquired), left foot: Secondary | ICD-10-CM | POA: Diagnosis not present

## 2021-10-21 DIAGNOSIS — M205X2 Other deformities of toe(s) (acquired), left foot: Secondary | ICD-10-CM | POA: Diagnosis not present

## 2021-10-21 DIAGNOSIS — E559 Vitamin D deficiency, unspecified: Secondary | ICD-10-CM | POA: Diagnosis not present

## 2021-10-21 DIAGNOSIS — L2084 Intrinsic (allergic) eczema: Secondary | ICD-10-CM | POA: Diagnosis not present

## 2021-10-21 DIAGNOSIS — I10 Essential (primary) hypertension: Secondary | ICD-10-CM | POA: Diagnosis not present

## 2021-10-21 DIAGNOSIS — M205X1 Other deformities of toe(s) (acquired), right foot: Secondary | ICD-10-CM | POA: Diagnosis not present

## 2021-10-21 DIAGNOSIS — M2041 Other hammer toe(s) (acquired), right foot: Secondary | ICD-10-CM | POA: Diagnosis not present

## 2021-10-21 DIAGNOSIS — Z7689 Persons encountering health services in other specified circumstances: Secondary | ICD-10-CM | POA: Diagnosis not present

## 2021-10-31 DIAGNOSIS — H16202 Unspecified keratoconjunctivitis, left eye: Secondary | ICD-10-CM | POA: Diagnosis not present

## 2021-11-12 ENCOUNTER — Ambulatory Visit (INDEPENDENT_AMBULATORY_CARE_PROVIDER_SITE_OTHER): Payer: Medicare Other

## 2021-11-12 DIAGNOSIS — I441 Atrioventricular block, second degree: Secondary | ICD-10-CM | POA: Diagnosis not present

## 2021-11-12 LAB — CUP PACEART REMOTE DEVICE CHECK
Battery Remaining Longevity: 71 mo
Battery Remaining Percentage: 74 %
Battery Voltage: 2.99 V
Brady Statistic AP VP Percent: 51 %
Brady Statistic AP VS Percent: 1 %
Brady Statistic AS VP Percent: 48 %
Brady Statistic AS VS Percent: 1 %
Brady Statistic RA Percent Paced: 49 %
Brady Statistic RV Percent Paced: 99 %
Date Time Interrogation Session: 20221228020011
Implantable Lead Implant Date: 20200930
Implantable Lead Implant Date: 20200930
Implantable Lead Location: 753859
Implantable Lead Location: 753860
Implantable Lead Model: 3830
Implantable Lead Model: 5076
Implantable Pulse Generator Implant Date: 20200930
Lead Channel Impedance Value: 490 Ohm
Lead Channel Impedance Value: 660 Ohm
Lead Channel Pacing Threshold Amplitude: 0.75 V
Lead Channel Pacing Threshold Amplitude: 0.75 V
Lead Channel Pacing Threshold Pulse Width: 0.4 ms
Lead Channel Pacing Threshold Pulse Width: 1 ms
Lead Channel Sensing Intrinsic Amplitude: 12 mV
Lead Channel Sensing Intrinsic Amplitude: 2.6 mV
Lead Channel Setting Pacing Amplitude: 2 V
Lead Channel Setting Pacing Amplitude: 2.5 V
Lead Channel Setting Pacing Pulse Width: 1 ms
Lead Channel Setting Sensing Sensitivity: 2 mV
Pulse Gen Model: 2272
Pulse Gen Serial Number: 9156742

## 2021-11-13 DIAGNOSIS — Z8551 Personal history of malignant neoplasm of bladder: Secondary | ICD-10-CM | POA: Diagnosis not present

## 2021-11-24 NOTE — Progress Notes (Signed)
Remote pacemaker transmission.   

## 2022-01-31 DIAGNOSIS — Z20822 Contact with and (suspected) exposure to covid-19: Secondary | ICD-10-CM | POA: Diagnosis not present

## 2022-02-03 DIAGNOSIS — Z961 Presence of intraocular lens: Secondary | ICD-10-CM | POA: Diagnosis not present

## 2022-02-11 ENCOUNTER — Ambulatory Visit (INDEPENDENT_AMBULATORY_CARE_PROVIDER_SITE_OTHER): Payer: Medicare Other

## 2022-02-11 DIAGNOSIS — I441 Atrioventricular block, second degree: Secondary | ICD-10-CM | POA: Diagnosis not present

## 2022-02-11 LAB — CUP PACEART REMOTE DEVICE CHECK
Battery Remaining Longevity: 69 mo
Battery Remaining Percentage: 71 %
Battery Voltage: 2.99 V
Brady Statistic AP VP Percent: 50 %
Brady Statistic AP VS Percent: 1 %
Brady Statistic AS VP Percent: 50 %
Brady Statistic AS VS Percent: 1 %
Brady Statistic RA Percent Paced: 46 %
Brady Statistic RV Percent Paced: 99 %
Date Time Interrogation Session: 20230329032913
Implantable Lead Implant Date: 20200930
Implantable Lead Implant Date: 20200930
Implantable Lead Location: 753859
Implantable Lead Location: 753860
Implantable Lead Model: 3830
Implantable Lead Model: 5076
Implantable Pulse Generator Implant Date: 20200930
Lead Channel Impedance Value: 490 Ohm
Lead Channel Impedance Value: 690 Ohm
Lead Channel Pacing Threshold Amplitude: 0.75 V
Lead Channel Pacing Threshold Amplitude: 0.75 V
Lead Channel Pacing Threshold Pulse Width: 0.4 ms
Lead Channel Pacing Threshold Pulse Width: 1 ms
Lead Channel Sensing Intrinsic Amplitude: 12 mV
Lead Channel Sensing Intrinsic Amplitude: 3.1 mV
Lead Channel Setting Pacing Amplitude: 2 V
Lead Channel Setting Pacing Amplitude: 2.5 V
Lead Channel Setting Pacing Pulse Width: 1 ms
Lead Channel Setting Sensing Sensitivity: 2 mV
Pulse Gen Model: 2272
Pulse Gen Serial Number: 9156742

## 2022-02-13 DIAGNOSIS — Z20822 Contact with and (suspected) exposure to covid-19: Secondary | ICD-10-CM | POA: Diagnosis not present

## 2022-02-14 DIAGNOSIS — Z20822 Contact with and (suspected) exposure to covid-19: Secondary | ICD-10-CM | POA: Diagnosis not present

## 2022-02-17 ENCOUNTER — Encounter: Payer: Medicare Other | Admitting: Physician Assistant

## 2022-02-17 DIAGNOSIS — E039 Hypothyroidism, unspecified: Secondary | ICD-10-CM | POA: Diagnosis not present

## 2022-02-17 DIAGNOSIS — E559 Vitamin D deficiency, unspecified: Secondary | ICD-10-CM | POA: Diagnosis not present

## 2022-02-17 DIAGNOSIS — R351 Nocturia: Secondary | ICD-10-CM | POA: Diagnosis not present

## 2022-02-17 DIAGNOSIS — I10 Essential (primary) hypertension: Secondary | ICD-10-CM | POA: Diagnosis not present

## 2022-02-24 NOTE — Progress Notes (Signed)
Remote pacemaker transmission.   

## 2022-03-02 DIAGNOSIS — Z20822 Contact with and (suspected) exposure to covid-19: Secondary | ICD-10-CM | POA: Diagnosis not present

## 2022-03-11 DIAGNOSIS — R059 Cough, unspecified: Secondary | ICD-10-CM | POA: Diagnosis not present

## 2022-03-11 DIAGNOSIS — Z20822 Contact with and (suspected) exposure to covid-19: Secondary | ICD-10-CM | POA: Diagnosis not present

## 2022-03-11 DIAGNOSIS — R051 Acute cough: Secondary | ICD-10-CM | POA: Diagnosis not present

## 2022-03-14 DIAGNOSIS — Z20822 Contact with and (suspected) exposure to covid-19: Secondary | ICD-10-CM | POA: Diagnosis not present

## 2022-03-20 DIAGNOSIS — Z20822 Contact with and (suspected) exposure to covid-19: Secondary | ICD-10-CM | POA: Diagnosis not present

## 2022-03-22 DIAGNOSIS — Z20822 Contact with and (suspected) exposure to covid-19: Secondary | ICD-10-CM | POA: Diagnosis not present

## 2022-03-23 DIAGNOSIS — Z20822 Contact with and (suspected) exposure to covid-19: Secondary | ICD-10-CM | POA: Diagnosis not present

## 2022-03-29 NOTE — Progress Notes (Signed)
? ?Cardiology Office Note ?Date:  03/29/2022  ?Patient ID:  Margaree, Sandhu May 01, 1941, MRN [id number] ?PCP:  Ann Held, DO  ?Cardiologist/Electrophysiologist: Dr. Lovena Le ? ?  ?Chief Complaint:    annual visit ? ?History of Present Illness: ?JEANNIFER DRAKEFORD is a 81 y.o. female with history of HTN, Mobitz II and CHB w/PPM, hypothyroidism, Afib. ? ?She comes in today to be seen for Dr. Lovena Le, last seen by him Jan 2021, at that time noted new Afib (about an hour) and started on Eliquis. ? ?I saw her 02/18/2021 ?She is doing well. ?Frustrated, spent thousands of dollars on hearing aids that don't work. ?She denies any CP, palpitations, or cardiac awareness. ?She stays active, going to ballroom dancing class after her visit here. ?No SOB, DOE ?No dizzy spells, near syncope or syncope. ?She never started the Eliquis, says she just doesn't think she needs it, her husband was on blood thinners and does not want that life. ?We discussed rational for Eliquis with noted AFib and rational for stroke prevention ?She thinks that was provoked by low Vit D levels and does not want to take the blood thinner. ?With her device check today, In feb she had a 7hour episode, April last year had 2 hours of AF, she feels that was 2/2 severe constipation 2/2 pain meds after her back surgery ?No further episode of any significant duration ?She remains un agreeable to [facility], we will continue to monitor burden ?Burden was <1% ?No changes were made ? ?TODAY ?She is doing well ?Infrequent momentary palpitations ?She has continued to work part time, but her part time job slowly but surely started to be closer to full time and she has recently quit. ? She des ball room dancing for exercise and fun. ?She denies any CP or SOB, no near syncope or syncope. ? ?She hopes to find another job, true part time to keep her mind sharp ? ?Device information ?SJM dual chamber PPM implanted 08/16/2019 ? ? ?Past Medical History:  ?Diagnosis Date  ?  Arthritis   ? Complication of anesthesia   ? HEARING LOSS, BILATERAL 03/02/2008  ? Qualifier: Diagnosis of  By: Jerold Coombe    ? Hx of cyst of breast 07/21/2012  ? Hypertension   ? Hypothyroidism 03/02/2008  ? Qualifier: Diagnosis of  By: Jerold Coombe    ? Melanoma ([place])   ? Mobitz type II atrioventricular block 08/15/2019  ? MRSA (methicillin resistant staph aureus) culture positive   ? Osteopenia   ? PONV (postoperative nausea and vomiting)   ? Rapid heart beat   ? benigh  ? Transitional cell carcinoma ([place])   ? Vitamin D deficiency 09/12/2019  ? ? ?Past Surgical History:  ?Procedure Laterality Date  ? ABDOMINAL HYSTERECTOMY  1987  ? partial  ? ANTERIOR LAT LUMBAR FUSION Left 12/19/2019  ? Procedure: Left Lumbar Two-Three, Lumbar Three-Four, Lumbar Four-Five Anterolateral lumbar Interbody Fusion;  Surgeon: Erline Levine, MD;  Location: [place];  Service: Neurosurgery;  Laterality: Left;  Left Lumbar 2-3 Lumbar 3-4 Lumbar 4-5 Anterolateral lumbar interbody fusion with percutaneous pedicle screws  ? BACK SURGERY    ? 08/02/2018  ? CATARACT EXTRACTION  2004  ? EYE SURGERY    ? cataract b/l  ? LUMBAR PERCUTANEOUS PEDICLE SCREW 3 LEVEL N/A 12/19/2019  ? Procedure: Lumbar Two-Three, Lumbar Three-Four, Lumbar Four-Five Percutaneous Pedicle Screw Placement;  Surgeon: Erline Levine, MD;  Location: [place];  Service: Neurosurgery;  Laterality: N/A;  ?  MELANOMA EXCISION  1977  ? left leg  ? MELANOMA EXCISION  06-24-09  ? r foot  ? PACEMAKER IMPLANT N/A 08/16/2019  ? Procedure: PACEMAKER IMPLANT;  Surgeon: Evans Lance, MD;  Location: [place] [facility];  Service: Cardiovascular;  Laterality: N/A;  ? SKIN CANCER EXCISION  1999  ? bladder transitional cell  ? teeth implants    ? TONSILLECTOMY    ? ? ?Current Outpatient Medications  ?Medication Sig Dispense Refill  ? acetaminophen (TYLENOL) 650 MG CR tablet Take 650 mg by mouth 2 (two) times daily.    ? amLODipine (NORVASC) 5 MG tablet TAKE 1 TABLET DAILY 90 tablet 1  ? ARTIFICIAL  TEAR SOLUTION OP Place 1 drop into both eyes daily as needed (irritation).    ? Cholecalciferol (VITAMIN D3) 50 MCG (2000 UT) capsule Take 2,000 Units by mouth daily.    ? COD LIVER OIL PO Take by mouth.    ? docusate sodium (COLACE) 100 MG capsule Take 200 mg by mouth daily as needed for mild constipation.    ? estradiol (ESTRACE) 0.5 MG tablet Take 1 tablet (0.5 mg total) by mouth daily. 90 tablet 1  ? guaifenesin (HUMIBID E) 400 MG TABS tablet Take 400 mg by mouth at bedtime.    ? Homeopathic Products [facility] SINUS RELIEF NA) Place 1 spray into the nose daily as needed (congestion).    ? Lactobacillus (AZO COMPLETE FEMININE BALANCE) CAPS Take 1 capsule by mouth daily.    ? medroxyPROGESTERone (PROVERA) 5 MG tablet 1 tab po qd 10 days a month as directed.  Brand name necessary. 90 tablet 3  ? meloxicam (MOBIC) 15 MG tablet 1/2-1 po qd prn 90 tablet 1  ? Misc Natural Products (GLUCOSAMINE CHOND COMPLEX/MSM) TABS Take 1 tablet by mouth 2 (two) times daily.    ? propranolol (INDERAL) 10 MG tablet TAKE 1 TABLET TWICE A DAY 180 tablet 1  ? sodium chloride (OCEAN) 0.65 % SOLN nasal spray Place 1 spray into both nostrils at bedtime as needed for congestion.    ? spironolactone (ALDACTONE) 25 MG tablet 1/2 tab po qd 45 tablet 3  ? SYNTHROID 75 MCG tablet Take 1 tablet (75 mcg total) by mouth daily. 90 tablet 3  ? ?No current facility-administered medications for this visit.  ? ? ?Allergies:   Antihistamines, diphenhydramine-type; Codeine; Hydrocodone-acetaminophen; Lactose intolerance (gi); Tramadol hcl; Gabapentin; and Vitamin d analogs  ? ?Social History:  The patient  reports that she quit smoking about 41 years ago. Her smoking use included cigarettes. She has a 4.50 pack-year smoking history. She has never used smokeless tobacco. She reports current alcohol use of about 1.0 - 2.0 standard drink per week. She reports that she does not use drugs.  ? ?Family History:  The patient's family history includes Alzheimer's  disease in her mother; Dementia in her mother; Diabetes in her mother; Hypertension in her father; Stroke in her father. ? ?ROS:  Please see the history of present illness.    ?All other systems are reviewed and otherwise negative.  ? ?PHYSICAL EXAM:  ?VS:  There were no vitals taken for this visit. BMI: There is no height or weight on file to calculate BMI. ?Well nourished, well developed, in no acute distress ?HEENT: normocephalic, atraumatic ?Neck: no JVD, carotid bruits or masses ?Cardiac:  RRR; no significant murmurs, no rubs, or gallops ?Lungs:  CTA b/l, no wheezing, rhonchi or rales ?Abd: soft, nontender ?MS: no deformity or atrophy ?Ext: no edema ?  Skin: warm and dry, no rash ?Neuro:  No gross deficits appreciated ?Psych: euthymic mood, full affect ? ?PPM site is stable, no tethering or discomfort ? ? ?EKG:  Done today and reviewed by myself shows  ?AV paced 60bpm ? ?Device interrogation done today and reviewed by myself:  ?Battery and lead measurements are good ?She has had more AFib ?Burden 2.4% ?Seems to be in the last few weeks ?All available EGMs reviewed, all true AFib or flutter ?Longest episode 9 hours, another near 2 hours, otherwise quite short mostly seconds in duration ?No RVR (paced) ?She is dependent ? ? ?08/17/2019; TTE ?IMPRESSIONS  ? 1. Left ventricular ejection fraction, by visual estimation, is 55 to  ?60%. The left ventricle has normal function. Normal left ventricular size.  ?Left ventricular septal wall thickness was mildly increased. There is  ?mildly increased left ventricular  ?hypertrophy.  ? 2. Global right ventricle has normal systolic function.The right  ?ventricular size is normal. No increase in right ventricular wall  ?thickness.  ? 3. Left atrial size was normal.  ? 4. Right atrial size was normal.  ? 5. The mitral valve is normal in structure. Mild mitral valve  ?regurgitation.  ? 6. The tricuspid valve is normal in structure. Tricuspid valve  ?regurgitation is mild.  ? 7. The  aortic valve is tricuspid Aortic valve regurgitation is mild by  ?color flow Doppler. Mild to moderate aortic valve sclerosis/calcification  ?without any evidence of aortic stenosis.  ? 8. The pulmon

## 2022-03-31 ENCOUNTER — Encounter: Payer: Self-pay | Admitting: Physician Assistant

## 2022-03-31 ENCOUNTER — Ambulatory Visit (INDEPENDENT_AMBULATORY_CARE_PROVIDER_SITE_OTHER): Payer: Medicare Other | Admitting: Physician Assistant

## 2022-03-31 VITALS — BP 130/80 | HR 60 | Ht 61.0 in | Wt 173.4 lb

## 2022-03-31 DIAGNOSIS — I441 Atrioventricular block, second degree: Secondary | ICD-10-CM | POA: Diagnosis not present

## 2022-03-31 DIAGNOSIS — I10 Essential (primary) hypertension: Secondary | ICD-10-CM | POA: Diagnosis not present

## 2022-03-31 DIAGNOSIS — Z950 Presence of cardiac pacemaker: Secondary | ICD-10-CM

## 2022-03-31 DIAGNOSIS — I480 Paroxysmal atrial fibrillation: Secondary | ICD-10-CM | POA: Diagnosis not present

## 2022-03-31 NOTE — Patient Instructions (Addendum)
Medication Instructions:  ? ?Your physician recommends that you continue on your current medications as directed. Please refer to the Current Medication list given to you today. ? ? ?*If you need a refill on your cardiac medications before your next appointment, please call your pharmacy* ? ? ?Lab Work: [place] ? ? ?If you have labs (blood work) drawn today and your tests are completely normal, you will receive your results only by: ?MyChart Message (if you have MyChart) OR ?A paper copy in the mail ?If you have any lab test that is abnormal or we need to change your treatment, we will call you to review the results. ? ? ?Testing/Procedures: NONE ORDERED  TODAY ? ? ? ?Follow-Up: ?At [facility], you and your health needs are our priority.  As part of our continuing mission to provide you with exceptional heart care, we have created designated Provider Care Teams.  These Care Teams include your primary Cardiologist (physician) and Advanced Practice Providers (APPs -  Physician Assistants and Nurse Practitioners) who all work together to provide you with the care you need, when you need it. ? ?We recommend signing up for the patient portal called "MyChart".  Sign up information is provided on this After Visit Summary.  MyChart is used to connect with patients for Virtual Visits (Telemedicine).  Patients are able to view lab/test results, encounter notes, upcoming appointments, etc.  Non-urgent messages can be sent to your provider as well.   ?To learn more about what you can do with MyChart, go to [email or web address].   ? ?Your next appointment:   ?4 month(s) ( CONTACT [facility] FOR EP SCHEDULING ISSUES ) ? ? ?The format for your next appointment:   ?In Person ? ?Provider:   ?You may see Dr. Lovena Le  or one of the following Advanced Practice Providers on your designated Care Team:   ?Tommye Standard, PA-C ? ? ?If primary card or EP is not listed click here to update    :1}  ? ? ?Other  Instructions ? ?Important Information About Sugar ? ? ? ? ? A ?

## 2022-05-13 ENCOUNTER — Ambulatory Visit (INDEPENDENT_AMBULATORY_CARE_PROVIDER_SITE_OTHER): Payer: Medicare Other

## 2022-05-13 DIAGNOSIS — I441 Atrioventricular block, second degree: Secondary | ICD-10-CM

## 2022-05-13 LAB — CUP PACEART REMOTE DEVICE CHECK
Battery Remaining Longevity: 66 mo
Battery Remaining Percentage: 68 %
Battery Voltage: 2.99 V
Brady Statistic AP VP Percent: 61 %
Brady Statistic AP VS Percent: 1 %
Brady Statistic AS VP Percent: 39 %
Brady Statistic AS VS Percent: 1 %
Brady Statistic RA Percent Paced: 60 %
Brady Statistic RV Percent Paced: 99 %
Date Time Interrogation Session: 20230628020014
Implantable Lead Implant Date: 20200930
Implantable Lead Implant Date: 20200930
Implantable Lead Location: 753859
Implantable Lead Location: 753860
Implantable Lead Model: 3830
Implantable Lead Model: 5076
Implantable Pulse Generator Implant Date: 20200930
Lead Channel Impedance Value: 530 Ohm
Lead Channel Impedance Value: 650 Ohm
Lead Channel Pacing Threshold Amplitude: 0.5 V
Lead Channel Pacing Threshold Amplitude: 0.75 V
Lead Channel Pacing Threshold Pulse Width: 0.4 ms
Lead Channel Pacing Threshold Pulse Width: 1 ms
Lead Channel Sensing Intrinsic Amplitude: 12 mV
Lead Channel Sensing Intrinsic Amplitude: 3.5 mV
Lead Channel Setting Pacing Amplitude: 2 V
Lead Channel Setting Pacing Amplitude: 2.5 V
Lead Channel Setting Pacing Pulse Width: 1 ms
Lead Channel Setting Sensing Sensitivity: 2 mV
Pulse Gen Model: 2272
Pulse Gen Serial Number: 9156742

## 2022-06-02 NOTE — Progress Notes (Signed)
Remote pacemaker transmission.   

## 2022-06-15 ENCOUNTER — Telehealth: Payer: Self-pay | Admitting: Family Medicine

## 2022-06-15 NOTE — Telephone Encounter (Signed)
Left message for patient to call back and schedule Medicare Annual Wellness Visit (AWV).   Please offer to do virtually or by telephone.  Left office number and my jabber [phone].  Last AWV:06/17/2021  Please schedule at anytime with Nurse Health Advisor.

## 2022-07-10 DIAGNOSIS — Z0000 Encounter for general adult medical examination without abnormal findings: Secondary | ICD-10-CM | POA: Diagnosis not present

## 2022-07-10 DIAGNOSIS — I10 Essential (primary) hypertension: Secondary | ICD-10-CM | POA: Diagnosis not present

## 2022-08-02 NOTE — Progress Notes (Unsigned)
Cardiology Office Note Date:  08/02/2022  Patient ID:  Wendy Velazquez, Wendy Velazquez 15-Mar-1941, MRN [id number] PCP:  Ann Held, DO  Cardiologist/Electrophysiologist: Dr. Lovena Le    Chief Complaint:    annual visit  History of Present Illness: Wendy Velazquez is a 81 y.o. female with history of HTN, Mobitz II and CHB w/PPM, hypothyroidism, Afib.  She comes in today to be seen for Dr. Lovena Le, last seen by him Jan 2021, at that time noted new Afib (about an hour) and started on Eliquis.  I saw her 02/18/2021 She is doing well. Frustrated, spent thousands of dollars on hearing aids that don't work. She denies any CP, palpitations, or cardiac awareness. She stays active, going to ballroom dancing class after her visit here. No SOB, DOE No dizzy spells, near syncope or syncope. She never started the Eliquis, says she just doesn't think she needs it, her husband was on blood thinners and does not want that life. We discussed rational for Eliquis with noted AFib and rational for stroke prevention She thinks that was provoked by low Vit D levels and does not want to take the blood thinner. With her device check today, In feb she had a 7hour episode, April last year had 2 hours of AF, she feels that was 2/2 severe constipation 2/2 pain meds after her back surgery No further episode of any significant duration She remains un agreeable to [facility], we will continue to monitor burden Burden was <1% No changes were made  I saw her 03/31/22 She is doing well Infrequent momentary palpitations She has continued to work part time, but her part time job slowly but surely started to be closer to full time and she has recently quit.  She des ball room dancing for exercise and fun. She denies any CP or SOB, no near syncope or syncope. She hopes to find another job, true part time to keep her mind sharp Device check with more Afib Re-discussed a/c she again felt the Afib was provoked by job stress,  remained un agreeable to a/c  *** symptoms *** AFib burden *** meds *** TSH?   Device information SJM dual chamber PPM implanted 08/16/2019 She is device dependent  Past Medical History:  Diagnosis Date   Arthritis    Complication of anesthesia    HEARING LOSS, BILATERAL 03/02/2008   Qualifier: Diagnosis of  By: Jerold Coombe     Hx of cyst of breast 07/21/2012   Hypertension    Hypothyroidism 03/02/2008   Qualifier: Diagnosis of  By: Jerold Coombe     Melanoma [facility])    Mobitz type II atrioventricular block 08/15/2019   MRSA (methicillin resistant staph aureus) culture positive    Osteopenia    PONV (postoperative nausea and vomiting)    Rapid heart beat    benigh   Transitional cell carcinoma ([place])    Vitamin D deficiency 09/12/2019    Past Surgical History:  Procedure Laterality Date   ABDOMINAL HYSTERECTOMY  1987   partial   ANTERIOR LAT LUMBAR FUSION Left 12/19/2019   Procedure: Left Lumbar Two-Three, Lumbar Three-Four, Lumbar Four-Five Anterolateral lumbar Interbody Fusion;  Surgeon: Erline Levine, MD;  Location: [place];  Service: Neurosurgery;  Laterality: Left;  Left Lumbar 2-3 Lumbar 3-4 Lumbar 4-5 Anterolateral lumbar interbody fusion with percutaneous pedicle screws   BACK SURGERY     08/02/2018   CATARACT EXTRACTION  2004   EYE SURGERY     cataract b/l  LUMBAR PERCUTANEOUS PEDICLE SCREW 3 LEVEL N/A 12/19/2019   Procedure: Lumbar Two-Three, Lumbar Three-Four, Lumbar Four-Five Percutaneous Pedicle Screw Placement;  Surgeon: Erline Levine, MD;  Location: [place];  Service: Neurosurgery;  Laterality: N/A;   MELANOMA EXCISION  1977   left leg   MELANOMA EXCISION  06-24-09   r foot   PACEMAKER IMPLANT N/A 08/16/2019   Procedure: PACEMAKER IMPLANT;  Surgeon: Evans Lance, MD;  Location: [place] CV LAB;  Service: Cardiovascular;  Laterality: N/A;   SKIN CANCER EXCISION  1999   bladder transitional cell   teeth implants     TONSILLECTOMY      Current  Outpatient Medications  Medication Sig Dispense Refill   acetaminophen (TYLENOL) 650 MG CR tablet Take 650 mg by mouth 2 (two) times daily.     amLODipine (NORVASC) 5 MG tablet TAKE 1 TABLET DAILY 90 tablet 1   amLODipine (NORVASC) 5 MG tablet Take 1 tablet by mouth daily. (Patient not taking: Reported on 03/31/2022)     ARTIFICIAL TEAR SOLUTION OP Place 1 drop into both eyes daily as needed (irritation).     chlorhexidine (PERIDEX) 0.12 % solution SMARTSIG:By Mouth     Cholecalciferol (VITAMIN D3) 50 MCG (2000 UT) capsule Take 2,000 Units by mouth daily.     COD LIVER OIL PO Take by mouth.     docusate sodium (COLACE) 100 MG capsule Take 200 mg by mouth daily as needed for mild constipation.     estradiol (ESTRACE) 0.5 MG tablet Take 1 tablet (0.5 mg total) by mouth daily. 90 tablet 1   famotidine (PEPCID) 10 MG tablet daily.     guaifenesin (HUMIBID E) 400 MG TABS tablet Take 400 mg by mouth at bedtime.     Homeopathic Products [facility] SINUS RELIEF NA) Place 1 spray into the nose daily as needed (congestion).     Lactobacillus (AZO COMPLETE FEMININE BALANCE) CAPS Take 1 capsule by mouth daily.     medroxyPROGESTERone (PROVERA) 5 MG tablet 1 tab po qd 10 days a month as directed.  Brand name necessary. 90 tablet 3   meloxicam (MOBIC) 15 MG tablet 1/2-1 po qd prn 90 tablet 1   Misc Natural Products (GLUCOSAMINE CHOND COMPLEX/MSM) TABS Take 1 tablet by mouth 2 (two) times daily.     propranolol (INDERAL) 10 MG tablet TAKE 1 TABLET TWICE A DAY 180 tablet 1   sodium chloride (OCEAN) 0.65 % SOLN nasal spray Place 1 spray into both nostrils at bedtime as needed for congestion.     spironolactone (ALDACTONE) 25 MG tablet 1/2 tab po qd 45 tablet 3   SYNTHROID 75 MCG tablet Take 1 tablet (75 mcg total) by mouth daily. 90 tablet 3   No current facility-administered medications for this visit.    Allergies:   Antihistamines, diphenhydramine-type; Codeine; Hydrocodone-acetaminophen; Lactose  intolerance (gi); Tramadol hcl; Gabapentin; and Vitamin d analogs   Social History:  The patient  reports that she quit smoking about 41 years ago. Her smoking use included cigarettes. She has a 4.50 pack-year smoking history. She has never used smokeless tobacco. She reports current alcohol use of about 1.0 - 2.0 standard drink of alcohol per week. She reports that she does not use drugs.   Family History:  The patient's family history includes Alzheimer's disease in her mother; Dementia in her mother; Diabetes in her mother; Hypertension in her father; Stroke in her father.  ROS:  Please see the history of present illness.    All  other systems are reviewed and otherwise negative.   PHYSICAL EXAM:  VS:  There were no vitals taken for this visit. BMI: There is no height or weight on file to calculate BMI. Well nourished, well developed, in no acute distress HEENT: normocephalic, atraumatic Neck: no JVD, carotid bruits or masses Cardiac:  *** RRR; no significant murmurs, no rubs, or gallops Lungs:  ***CTA b/l, no wheezing, rhonchi or rales Abd: soft, nontender MS: no deformity or atrophy Ext: *** no edema Skin: warm and dry, no rash Neuro:  No gross deficits appreciated Psych: euthymic mood, full affect  *** PPM site is stable, no tethering or discomfort   EKG:  Not done today  Device interrogation done today and reviewed by myself:  ***   08/17/2019; TTE IMPRESSIONS   1. Left ventricular ejection fraction, by visual estimation, is 55 to  60%. The left ventricle has normal function. Normal left ventricular size.  Left ventricular septal wall thickness was mildly increased. There is  mildly increased left ventricular  hypertrophy.   2. Global right ventricle has normal systolic function.The right  ventricular size is normal. No increase in right ventricular wall  thickness.   3. Left atrial size was normal.   4. Right atrial size was normal.   5. The mitral valve is normal in  structure. Mild mitral valve  regurgitation.   6. The tricuspid valve is normal in structure. Tricuspid valve  regurgitation is mild.   7. The aortic valve is tricuspid Aortic valve regurgitation is mild by  color flow Doppler. Mild to moderate aortic valve sclerosis/calcification  without any evidence of aortic stenosis.   8. The pulmonic valve was grossly normal. Pulmonic valve regurgitation is  mild by color flow Doppler.   9. Mildly elevated pulmonary artery systolic pressure.     Recent Labs: No results found for requested labs within last 365 days.  No results found for requested labs within last 365 days.   CrCl cannot be calculated (Patient's most recent lab result is older than the maximum 21 days allowed.).   Wt Readings from Last 3 Encounters:  03/31/22 173 lb 6.4 oz (78.7 kg)  06/17/21 174 lb 3.2 oz (79 kg)  06/17/21 174 lb (78.9 kg)     Other studies reviewed: Additional studies/records reviewed today include: summarized above  ASSESSMENT AND PLAN:  1. PPM     *** Intact function, no programming changes made  2. HTN     *** Looks good  3. Paroxysmal AFib     CHA2DS2Vasc is 4     ***  Disposition: ***    Current medicines are reviewed at length with the patient today.  The patient did not have any concerns regarding medicines.  Venetia Night, PA-C 08/02/2022 9:39 AM     [address] [place] [address] [phone] (office)  [phone] (fax)

## 2022-08-03 ENCOUNTER — Encounter: Payer: Self-pay | Admitting: Physician Assistant

## 2022-08-03 ENCOUNTER — Ambulatory Visit: Payer: Medicare Other | Attending: Physician Assistant | Admitting: Physician Assistant

## 2022-08-03 VITALS — BP 122/80 | HR 68 | Ht 61.0 in | Wt 170.6 lb

## 2022-08-03 DIAGNOSIS — I480 Paroxysmal atrial fibrillation: Secondary | ICD-10-CM | POA: Diagnosis not present

## 2022-08-03 DIAGNOSIS — Z950 Presence of cardiac pacemaker: Secondary | ICD-10-CM | POA: Diagnosis not present

## 2022-08-03 DIAGNOSIS — I10 Essential (primary) hypertension: Secondary | ICD-10-CM | POA: Insufficient documentation

## 2022-08-03 LAB — CUP PACEART INCLINIC DEVICE CHECK
Battery Remaining Longevity: 61 mo
Battery Voltage: 2.99 V
Brady Statistic RA Percent Paced: 56 %
Brady Statistic RV Percent Paced: 99.95 %
Date Time Interrogation Session: 20230918172400
Implantable Lead Implant Date: 20200930
Implantable Lead Implant Date: 20200930
Implantable Lead Location: 753859
Implantable Lead Location: 753860
Implantable Lead Model: 3830
Implantable Lead Model: 5076
Implantable Pulse Generator Implant Date: 20200930
Lead Channel Impedance Value: 525 Ohm
Lead Channel Impedance Value: 650 Ohm
Lead Channel Pacing Threshold Amplitude: 0.5 V
Lead Channel Pacing Threshold Amplitude: 0.5 V
Lead Channel Pacing Threshold Amplitude: 0.75 V
Lead Channel Pacing Threshold Amplitude: 0.75 V
Lead Channel Pacing Threshold Pulse Width: 0.4 ms
Lead Channel Pacing Threshold Pulse Width: 0.4 ms
Lead Channel Pacing Threshold Pulse Width: 1 ms
Lead Channel Pacing Threshold Pulse Width: 1 ms
Lead Channel Sensing Intrinsic Amplitude: 12 mV
Lead Channel Sensing Intrinsic Amplitude: 2.8 mV
Lead Channel Setting Pacing Amplitude: 2 V
Lead Channel Setting Pacing Amplitude: 2.5 V
Lead Channel Setting Pacing Pulse Width: 1 ms
Lead Channel Setting Sensing Sensitivity: 2 mV
Pulse Gen Model: 2272
Pulse Gen Serial Number: 9156742

## 2022-08-03 NOTE — Patient Instructions (Signed)
Medication Instructions:   Your physician recommends that you continue on your current medications as directed. Please refer to the Current Medication list given to you today.   *If you need a refill on your cardiac medications before your next appointment, please call your pharmacy*   Lab Work: [place]   If you have labs (blood work) drawn today and your tests are completely normal, you will receive your results only by: [place] (if you have MyChart) OR A paper copy in the mail If you have any lab test that is abnormal or we need to change your treatment, we will call you to review the results.   Testing/Procedures: NONE ORDERED  TODAY     Follow-Up: At [facility], you and your health needs are our priority.  As part of our continuing mission to provide you with exceptional heart care, we have created designated Provider Care Teams.  These Care Teams include your primary Cardiologist (physician) and Advanced Practice Providers (APPs -  Physician Assistants and Nurse Practitioners) who all work together to provide you with the care you need, when you need it.  We recommend signing up for the patient portal called "MyChart".  Sign up information is provided on this After Visit Summary.  MyChart is used to connect with patients for Virtual Visits (Telemedicine).  Patients are able to view lab/test results, encounter notes, upcoming appointments, etc.  Non-urgent messages can be sent to your provider as well.   To learn more about what you can do with MyChart, go to [email or web address].    Your next appointment:   1 year(s)  The format for your next appointment:   In Person  Provider:   Cristopher Peru, MD    Other Instructions   Important Information About Sugar

## 2022-08-12 ENCOUNTER — Ambulatory Visit (INDEPENDENT_AMBULATORY_CARE_PROVIDER_SITE_OTHER): Payer: Medicare Other

## 2022-08-12 DIAGNOSIS — I441 Atrioventricular block, second degree: Secondary | ICD-10-CM | POA: Diagnosis not present

## 2022-08-12 LAB — CUP PACEART REMOTE DEVICE CHECK
Battery Remaining Longevity: 62 mo
Battery Remaining Percentage: 65 %
Battery Voltage: 2.99 V
Brady Statistic AP VP Percent: 55 %
Brady Statistic AP VS Percent: 1 %
Brady Statistic AS VP Percent: 45 %
Brady Statistic AS VS Percent: 1 %
Brady Statistic RA Percent Paced: 54 %
Brady Statistic RV Percent Paced: 99 %
Date Time Interrogation Session: 20230927020016
Implantable Lead Implant Date: 20200930
Implantable Lead Implant Date: 20200930
Implantable Lead Location: 753859
Implantable Lead Location: 753860
Implantable Lead Model: 3830
Implantable Lead Model: 5076
Implantable Pulse Generator Implant Date: 20200930
Lead Channel Impedance Value: 540 Ohm
Lead Channel Impedance Value: 650 Ohm
Lead Channel Pacing Threshold Amplitude: 0.5 V
Lead Channel Pacing Threshold Amplitude: 0.75 V
Lead Channel Pacing Threshold Pulse Width: 0.4 ms
Lead Channel Pacing Threshold Pulse Width: 1 ms
Lead Channel Sensing Intrinsic Amplitude: 12 mV
Lead Channel Sensing Intrinsic Amplitude: 3 mV
Lead Channel Setting Pacing Amplitude: 2 V
Lead Channel Setting Pacing Amplitude: 2.5 V
Lead Channel Setting Pacing Pulse Width: 1 ms
Lead Channel Setting Sensing Sensitivity: 2 mV
Pulse Gen Model: 2272
Pulse Gen Serial Number: 9156742

## 2022-08-18 DIAGNOSIS — Z23 Encounter for immunization: Secondary | ICD-10-CM | POA: Diagnosis not present

## 2022-08-20 NOTE — Progress Notes (Signed)
Remote pacemaker transmission.   

## 2022-09-04 DIAGNOSIS — L821 Other seborrheic keratosis: Secondary | ICD-10-CM | POA: Diagnosis not present

## 2022-09-04 DIAGNOSIS — I8312 Varicose veins of left lower extremity with inflammation: Secondary | ICD-10-CM | POA: Diagnosis not present

## 2022-09-04 DIAGNOSIS — D1801 Hemangioma of skin and subcutaneous tissue: Secondary | ICD-10-CM | POA: Diagnosis not present

## 2022-09-04 DIAGNOSIS — L738 Other specified follicular disorders: Secondary | ICD-10-CM | POA: Diagnosis not present

## 2022-09-04 DIAGNOSIS — L814 Other melanin hyperpigmentation: Secondary | ICD-10-CM | POA: Diagnosis not present

## 2022-09-04 DIAGNOSIS — I8311 Varicose veins of right lower extremity with inflammation: Secondary | ICD-10-CM | POA: Diagnosis not present

## 2022-09-04 DIAGNOSIS — I872 Venous insufficiency (chronic) (peripheral): Secondary | ICD-10-CM | POA: Diagnosis not present

## 2022-09-04 DIAGNOSIS — Z85820 Personal history of malignant melanoma of skin: Secondary | ICD-10-CM | POA: Diagnosis not present

## 2022-09-04 DIAGNOSIS — D2239 Melanocytic nevi of other parts of face: Secondary | ICD-10-CM | POA: Diagnosis not present

## 2022-09-04 DIAGNOSIS — L570 Actinic keratosis: Secondary | ICD-10-CM | POA: Diagnosis not present

## 2022-11-11 ENCOUNTER — Ambulatory Visit (INDEPENDENT_AMBULATORY_CARE_PROVIDER_SITE_OTHER): Payer: Medicare Other

## 2022-11-11 DIAGNOSIS — I441 Atrioventricular block, second degree: Secondary | ICD-10-CM

## 2022-11-11 DIAGNOSIS — Z950 Presence of cardiac pacemaker: Secondary | ICD-10-CM

## 2022-11-12 LAB — CUP PACEART REMOTE DEVICE CHECK
Battery Remaining Longevity: 59 mo
Battery Remaining Percentage: 62 %
Battery Voltage: 2.99 V
Brady Statistic AP VP Percent: 53 %
Brady Statistic AP VS Percent: 1 %
Brady Statistic AS VP Percent: 47 %
Brady Statistic AS VS Percent: 1 %
Brady Statistic RA Percent Paced: 51 %
Brady Statistic RV Percent Paced: 99 %
Date Time Interrogation Session: 20231227020025
Implantable Lead Connection Status: 753985
Implantable Lead Connection Status: 753985
Implantable Lead Implant Date: 20200930
Implantable Lead Implant Date: 20200930
Implantable Lead Location: 753859
Implantable Lead Location: 753860
Implantable Lead Model: 3830
Implantable Lead Model: 5076
Implantable Pulse Generator Implant Date: 20200930
Lead Channel Impedance Value: 510 Ohm
Lead Channel Impedance Value: 630 Ohm
Lead Channel Pacing Threshold Amplitude: 0.5 V
Lead Channel Pacing Threshold Amplitude: 0.75 V
Lead Channel Pacing Threshold Pulse Width: 0.4 ms
Lead Channel Pacing Threshold Pulse Width: 1 ms
Lead Channel Sensing Intrinsic Amplitude: 12 mV
Lead Channel Sensing Intrinsic Amplitude: 2.2 mV
Lead Channel Setting Pacing Amplitude: 2 V
Lead Channel Setting Pacing Amplitude: 2.5 V
Lead Channel Setting Pacing Pulse Width: 1 ms
Lead Channel Setting Sensing Sensitivity: 2 mV
Pulse Gen Model: 2272
Pulse Gen Serial Number: 9156742

## 2022-11-26 DIAGNOSIS — Z8551 Personal history of malignant neoplasm of bladder: Secondary | ICD-10-CM | POA: Diagnosis not present

## 2022-12-01 NOTE — Progress Notes (Signed)
Remote pacemaker transmission.   

## 2022-12-22 ENCOUNTER — Emergency Department (HOSPITAL_BASED_OUTPATIENT_CLINIC_OR_DEPARTMENT_OTHER): Payer: Medicare Other

## 2022-12-22 ENCOUNTER — Encounter (HOSPITAL_BASED_OUTPATIENT_CLINIC_OR_DEPARTMENT_OTHER): Payer: Self-pay | Admitting: Emergency Medicine

## 2022-12-22 ENCOUNTER — Emergency Department (HOSPITAL_BASED_OUTPATIENT_CLINIC_OR_DEPARTMENT_OTHER)
Admission: EM | Admit: 2022-12-22 | Discharge: 2022-12-23 | Disposition: A | Discharge status: Home / Self Care | Payer: Medicare Other | Attending: Emergency Medicine | Admitting: Emergency Medicine

## 2022-12-22 ENCOUNTER — Other Ambulatory Visit: Payer: Self-pay

## 2022-12-22 DIAGNOSIS — E039 Hypothyroidism, unspecified: Secondary | ICD-10-CM | POA: Diagnosis not present

## 2022-12-22 DIAGNOSIS — Z85819 Personal history of malignant neoplasm of unspecified site of lip, oral cavity, and pharynx: Secondary | ICD-10-CM | POA: Insufficient documentation

## 2022-12-22 DIAGNOSIS — S59902A Unspecified injury of left elbow, initial encounter: Secondary | ICD-10-CM | POA: Insufficient documentation

## 2022-12-22 DIAGNOSIS — S01512A Laceration without foreign body of oral cavity, initial encounter: Secondary | ICD-10-CM | POA: Insufficient documentation

## 2022-12-22 DIAGNOSIS — Z87891 Personal history of nicotine dependence: Secondary | ICD-10-CM | POA: Diagnosis not present

## 2022-12-22 DIAGNOSIS — Z79899 Other long term (current) drug therapy: Secondary | ICD-10-CM | POA: Diagnosis not present

## 2022-12-22 DIAGNOSIS — Z23 Encounter for immunization: Secondary | ICD-10-CM | POA: Insufficient documentation

## 2022-12-22 DIAGNOSIS — M25522 Pain in left elbow: Secondary | ICD-10-CM | POA: Diagnosis not present

## 2022-12-22 DIAGNOSIS — Z85820 Personal history of malignant melanoma of skin: Secondary | ICD-10-CM | POA: Diagnosis not present

## 2022-12-22 DIAGNOSIS — S01511A Laceration without foreign body of lip, initial encounter: Secondary | ICD-10-CM | POA: Diagnosis not present

## 2022-12-22 DIAGNOSIS — Y92009 Unspecified place in unspecified non-institutional (private) residence as the place of occurrence of the external cause: Secondary | ICD-10-CM | POA: Insufficient documentation

## 2022-12-22 DIAGNOSIS — Z79890 Hormone replacement therapy (postmenopausal): Secondary | ICD-10-CM | POA: Diagnosis not present

## 2022-12-22 DIAGNOSIS — I679 Cerebrovascular disease, unspecified: Secondary | ICD-10-CM | POA: Diagnosis not present

## 2022-12-22 DIAGNOSIS — S00531A Contusion of lip, initial encounter: Secondary | ICD-10-CM

## 2022-12-22 DIAGNOSIS — M4312 Spondylolisthesis, cervical region: Secondary | ICD-10-CM | POA: Diagnosis not present

## 2022-12-22 DIAGNOSIS — I10 Essential (primary) hypertension: Secondary | ICD-10-CM | POA: Diagnosis not present

## 2022-12-22 DIAGNOSIS — W0110XA Fall on same level from slipping, tripping and stumbling with subsequent striking against unspecified object, initial encounter: Secondary | ICD-10-CM | POA: Diagnosis not present

## 2022-12-22 DIAGNOSIS — Z950 Presence of cardiac pacemaker: Secondary | ICD-10-CM | POA: Insufficient documentation

## 2022-12-22 DIAGNOSIS — M545 Low back pain, unspecified: Secondary | ICD-10-CM | POA: Diagnosis not present

## 2022-12-22 DIAGNOSIS — M8588 Other specified disorders of bone density and structure, other site: Secondary | ICD-10-CM | POA: Diagnosis not present

## 2022-12-22 DIAGNOSIS — Z043 Encounter for examination and observation following other accident: Secondary | ICD-10-CM | POA: Diagnosis not present

## 2022-12-22 MED ORDER — TETANUS-DIPHTH-ACELL PERTUSSIS 5-2.5-18.5 LF-MCG/0.5 IM SUSY
0.5000 mL | PREFILLED_SYRINGE | Freq: Once | INTRAMUSCULAR | Status: AC
  Administered 2022-12-22: 0.5 mL via INTRAMUSCULAR
  Filled 2022-12-22: qty 0.5

## 2022-12-22 NOTE — ED Provider Notes (Signed)
[place] DEPT [facility] Provider Note: Wendy Spurling, MD, FACEP  CSN: [id number] MRN: [id number] ARRIVAL: 12/22/22 at [place]: [place]  12/22/22 11:05 PM Wendy Velazquez is a 82 y.o. female who tripped over a rug and fell forward striking her face.  This occurred about 6:30 PM.  She has a laceration to her right lower lip below the vermilion border.  She also has a laceration on the inside of the right lower lip.  She is having mild pain in her left elbow which she describes as simply soreness.  She is fully able to move the left elbow without difficulty.  She is also having some pain in her right lower back at about the SI joint but states that she has chronic pain there due to arthritis.  She denies neck or midline spine pain.  Her last tetanus booster was in July 2014   Past Medical History:  Diagnosis Date   Arthritis    Complication of anesthesia    HEARING LOSS, BILATERAL 03/02/2008   Qualifier: Diagnosis of  By: Jerold Coombe     Hx of cyst of breast 07/21/2012   Hypertension    Hypothyroidism 03/02/2008   Qualifier: Diagnosis of  By: Jerold Coombe     Melanoma [facility])    Mobitz type II atrioventricular block 08/15/2019   MRSA (methicillin resistant staph aureus) culture positive    Osteopenia    PONV (postoperative nausea and vomiting)    Rapid heart beat    benigh   Transitional cell carcinoma ([place])    Vitamin D deficiency 09/12/2019    Past Surgical History:  Procedure Laterality Date   ABDOMINAL HYSTERECTOMY  1987   partial   ANTERIOR LAT LUMBAR FUSION Left 12/19/2019   Procedure: Left Lumbar Two-Three, Lumbar Three-Four, Lumbar Four-Five Anterolateral lumbar Interbody Fusion;  Surgeon: Erline Levine, MD;  Location: [place];  Service: Neurosurgery;  Laterality: Left;  Left Lumbar 2-3 Lumbar 3-4 Lumbar 4-5 Anterolateral lumbar interbody fusion with percutaneous pedicle screws   BACK SURGERY     08/02/2018    CATARACT EXTRACTION  2004   EYE SURGERY     cataract b/l   LUMBAR PERCUTANEOUS PEDICLE SCREW 3 LEVEL N/A 12/19/2019   Procedure: Lumbar Two-Three, Lumbar Three-Four, Lumbar Four-Five Percutaneous Pedicle Screw Placement;  Surgeon: Erline Levine, MD;  Location: [place];  Service: Neurosurgery;  Laterality: N/A;   MELANOMA EXCISION  1977   left leg   MELANOMA EXCISION  06-24-09   r foot   PACEMAKER IMPLANT N/A 08/16/2019   Procedure: PACEMAKER IMPLANT;  Surgeon: Evans Lance, MD;  Location: [place] CV LAB;  Service: Cardiovascular;  Laterality: N/A;   SKIN CANCER EXCISION  1999   bladder transitional cell   teeth implants     TONSILLECTOMY      Family History  Problem Relation Age of Onset   Diabetes Mother    Alzheimer's disease Mother    Dementia Mother    Stroke Father    Hypertension Father     Social History   Tobacco Use   Smoking status: Former    Packs/day: 0.30    Years: 15.00    Total pack years: 4.50    Types: Cigarettes    Quit date: 03/15/1981    Years since quitting: 41.8   Smokeless tobacco: Never  Vaping Use   Vaping Use: Never used  Substance Use Topics   Alcohol  use: Yes    Alcohol/week: 1.0 - 2.0 standard drink of alcohol    Types: 1 - 2 Glasses of wine per week   Drug use: No    Prior to Admission medications   Medication Sig Start Date End Date Taking? Authorizing Provider  acetaminophen (TYLENOL) 650 MG CR tablet Take 650 mg by mouth 2 (two) times daily.    [provider]  amLODipine (NORVASC) 5 MG tablet Take 1 tablet by mouth daily. 11/25/21   [provider]  ARTIFICIAL TEAR SOLUTION OP Place 1 drop into both eyes daily as needed (irritation).    [provider]  chlorhexidine (PERIDEX) 0.12 % solution SMARTSIG:By Mouth 03/19/22   [provider]  Cholecalciferol (VITAMIN D3) 50 MCG (2000 UT) capsule Take 2,000 Units by mouth daily.    [provider]  COD LIVER OIL PO Take 1 capsule by mouth daily.     [provider]  docusate sodium (COLACE) 100 MG capsule Take 200 mg by mouth daily as needed for mild constipation.    [provider]  estradiol (ESTRACE) 0.5 MG tablet Take 1 tablet (0.5 mg total) by mouth daily. 06/17/21   Roma Schanz R, DO  famotidine (PEPCID) 10 MG tablet Take 10 mg by mouth as needed for heartburn or indigestion.    [provider]  guaifenesin (HUMIBID E) 400 MG TABS tablet Take 400 mg by mouth at bedtime.    [provider]  Homeopathic Products [facility] SINUS RELIEF NA) Place 1 spray into the nose daily as needed (congestion).    [provider]  Lactobacillus (AZO COMPLETE FEMININE BALANCE) CAPS Take 1 capsule by mouth daily.    [provider]  medroxyPROGESTERone (PROVERA) 5 MG tablet 1 tab po qd 10 days a month as directed.  Brand name necessary. 10/15/20   Ann Held, DO  meloxicam (MOBIC) 15 MG tablet 1/2-1 po qd prn 06/17/21   Carollee Herter, Alferd Apa, DO  Misc Natural Products (GLUCOSAMINE CHOND COMPLEX/MSM) TABS Take 1 tablet by mouth 2 (two) times daily.    [provider]  propranolol (INDERAL) 10 MG tablet TAKE 1 TABLET TWICE A DAY 05/30/21   Carollee Herter, Alferd Apa, DO  sodium chloride (OCEAN) 0.65 % SOLN nasal spray Place 1 spray into both nostrils at bedtime as needed for congestion.    [provider]  spironolactone (ALDACTONE) 25 MG tablet 1/2 tab po qd 06/17/21   Carollee Herter, Yvonne R, DO  SYNTHROID 75 MCG tablet Take 1 tablet (75 mcg total) by mouth daily. 06/17/21   Ann Held, DO    Allergies Antihistamines, diphenhydramine-type; Codeine; Hydrocodone-acetaminophen; Lactose intolerance (gi); Tramadol hcl; Gabapentin; and Vitamin d analogs   REVIEW OF SYSTEMS  Negative except as noted here or in the History of Present Illness.   PHYSICAL EXAMINATION  Initial Vital Signs Blood pressure (!) 181/89, pulse 69, temperature 97.7 F (36.5 C), resp. rate 18,  height '5\' 1"'$  (1.549 m), weight 77.4 kg, SpO2 99 %.  Examination General: Well-developed, well-nourished female in no acute distress; appearance consistent with age of record HENT: normocephalic; superficial laceration of buccal surface of right lower lip; laceration of skin of right lower lip with surrounding ecchymosis:    Eyes: pupils equal, round and reactive to light; extraocular muscles intact Neck: supple; nontender Heart: regular rate and rhythm Lungs: clear to auscultation bilaterally Abdomen: soft; nondistended; nontender; bowel sounds present Back: No spinal tenderness Extremities: No deformity; full  range of motion; minimal tenderness of left elbow; trace edema of lower legs Neurologic: Awake, alert and oriented; motor function intact in all extremities and symmetric; no facial droop Skin: Warm and dry Psychiatric: Normal mood and affect   RESULTS  Summary of this visit's results, reviewed and interpreted by myself:   EKG Interpretation  Date/Time:    Ventricular Rate:    PR Interval:    QRS Duration:   QT Interval:    QTC Calculation:   R Axis:     Text Interpretation:         Laboratory Studies: No results found for this or any previous visit (from the past 24 hour(s)). Imaging Studies: DG Elbow Complete Left  Result Date: 12/22/2022 CLINICAL DATA:  [place] with subsequent elbow pain. EXAM: LEFT ELBOW - COMPLETE 3+ VIEW COMPARISON:  None Available. FINDINGS: Question small elbow joint effusion. No visible fracture however. No significant degenerative changes. IMPRESSION: Question small elbow joint effusion. No visible fracture. Electronically Signed   By: Nelson Chimes M.D.   On: 12/22/2022 20:45   DG Lumbar Spine Complete  Result Date: 12/22/2022 CLINICAL DATA:  Fall and back pain. EXAM: LUMBAR SPINE - COMPLETE 4+ VIEW COMPARISON:  Lumbar spine radiograph dated 12/18/2020. FINDINGS: Five lumbar type vertebra. There is no acute fracture or subluxation of the  lumbar spine. Evaluation however is limited due to advanced osteopenia and degenerative changes. L2-L5 disc spacer and posterior fusion. The hardware appears intact. There is age indeterminate, possibly old, mild compression fracture of the superior endplate of L87. Correlation with clinical exam and point tenderness recommended. The soft tissues are unremarkable. IMPRESSION: 1. No acute fracture or subluxation of the lumbar spine. 2. Age indeterminate, possibly old, mild compression fracture of the superior endplate of F64. Correlation with clinical exam and point tenderness recommended. Electronically Signed   By: Anner Crete M.D.   On: 12/22/2022 20:45   CT Cervical Spine Wo Contrast  Result Date: 12/22/2022 CLINICAL DATA:  Post fall. EXAM: CT CERVICAL SPINE WITHOUT CONTRAST TECHNIQUE: Multidetector CT imaging of the cervical spine was performed without intravenous contrast. Multiplanar CT image reconstructions were also generated. RADIATION DOSE REDUCTION: This exam was performed according to the departmental dose-optimization program which includes automated exposure control, adjustment of the mA and/or kV according to patient size and/or use of iterative reconstruction technique. COMPARISON:  None Available. FINDINGS: Alignment: Exaggerated cervical lordosis. Minimal anterolisthesis of C4 on C5. Skull base and vertebrae: No acute fracture. No primary bone lesion or focal pathologic process. Soft tissues and spinal canal: No prevertebral fluid or swelling. No visible canal hematoma. Disc levels: Multilevel severe osteoarthritic changes of the cervical spine. Advanced posterior facet arthropathy. Upper chest: Negative. Other: None. IMPRESSION: No evidence of acute traumatic injury to cervical spine. Multilevel severe osteoarthritic changes of the cervical spine. Electronically Signed   By: Fidela Salisbury M.D.   On: 12/22/2022 20:40   CT Head Wo Contrast  Result Date: 12/22/2022 CLINICAL DATA:   Status post fall. EXAM: CT HEAD WITHOUT CONTRAST TECHNIQUE: Contiguous axial images were obtained from the base of the skull through the vertex without intravenous contrast. RADIATION DOSE REDUCTION: This exam was performed according to the departmental dose-optimization program which includes automated exposure control, adjustment of the mA and/or kV according to patient size and/or use of iterative reconstruction technique. COMPARISON:  None Available. FINDINGS: Brain: No evidence of acute infarction, hemorrhage, hydrocephalus, extra-axial collection or mass lesion/mass effect. Brain parenchymal volume loss and microangiopathy. Vascular: No hyperdense vessel  or unexpected calcification. Skull: Normal. Negative for fracture or focal lesion. Sinuses/Orbits: No acute finding. Other: None. IMPRESSION: 1. No acute intracranial abnormality. 2. Brain parenchymal volume loss and microangiopathy. Electronically Signed   By: Fidela Salisbury M.D.   On: 12/22/2022 20:35    ED COURSE and MDM  Nursing notes, initial and subsequent vitals signs, including pulse oximetry, reviewed and interpreted by myself.  Vitals:   12/22/22 2003 12/22/22 2317  BP: (!) 181/89 (!) 159/84  Pulse: 69 70  Resp: 18 16  Temp: 97.7 F (36.5 C)   SpO2: 99% 96%  Weight: 77.4 kg   Height: '5\' 1"'$  (1.549 m)    Medications  Tdap (BOOSTRIX) injection 0.5 mL (0.5 mLs Intramuscular Given 12/22/22 2333)    The pain and tenderness of the patient's left elbow is minimal and I do not believe a CT scan is indicated.  My suspicion of an occult fracture is very low.  She has no spinal tenderness and no pain in her mid back so I suspect the T12 fracture is an old fracture.  Patient's tetanus immunization was updated-it had been nearly 10 years since her last immunization.  PROCEDURES  Procedures LACERATION REPAIR Performed by: Karen Chafe Abdinasir Spadafore Authorized by: Karen Chafe Jafar Poffenberger Consent: Verbal consent obtained. Risks and benefits: risks, benefits  and alternatives were discussed Consent given by: patient Patient identity confirmed: provided demographic data Prepped and Draped in normal sterile fashion Wound explored  Laceration Location: Lower lip  Laceration Length: 1.5 cm  No Foreign Bodies seen or palpated  Anesthesia: None  Irrigation method: syringe Amount of cleaning: standard  Skin closure: Dermabond  Patient tolerance: Patient tolerated the procedure well with no immediate complications.   ED DIAGNOSES     ICD-10-CM   1. Fall in home, initial encounter  W19.XXXA    Y92.009     2. Laceration of lower lip, initial encounter  S01.511A     3. Laceration of buccal mucosa, initial encounter  S01.512A     4. Contusion of lip, initial encounter  S00.531A     5. Elbow injury, left, initial encounter  K16.010X          Shanon Rosser, MD 12/22/22 2359

## 2022-12-22 NOTE — ED Triage Notes (Signed)
Pt c/o tripping over a rug and hitting her face on the floor. Pt has small laceration to bottom lip, bleeding controlled at this time. Pt also endorses left elbow pain, does have full range of motion. Denies LOC or blood thinners.

## 2023-02-10 ENCOUNTER — Ambulatory Visit (INDEPENDENT_AMBULATORY_CARE_PROVIDER_SITE_OTHER): Payer: Medicare Other

## 2023-02-10 DIAGNOSIS — I441 Atrioventricular block, second degree: Secondary | ICD-10-CM

## 2023-02-10 LAB — CUP PACEART REMOTE DEVICE CHECK
Battery Remaining Longevity: 56 mo
Battery Remaining Percentage: 59 %
Battery Voltage: 2.99 V
Brady Statistic AP VP Percent: 55 %
Brady Statistic AP VS Percent: 1 %
Brady Statistic AS VP Percent: 45 %
Brady Statistic AS VS Percent: 1 %
Brady Statistic RA Percent Paced: 53 %
Brady Statistic RV Percent Paced: 99 %
Date Time Interrogation Session: 20240327022428
Implantable Lead Connection Status: 753985
Implantable Lead Connection Status: 753985
Implantable Lead Implant Date: 20200930
Implantable Lead Implant Date: 20200930
Implantable Lead Location: 753859
Implantable Lead Location: 753860
Implantable Lead Model: 3830
Implantable Lead Model: 5076
Implantable Pulse Generator Implant Date: 20200930
Lead Channel Impedance Value: 510 Ohm
Lead Channel Impedance Value: 630 Ohm
Lead Channel Pacing Threshold Amplitude: 0.5 V
Lead Channel Pacing Threshold Amplitude: 0.75 V
Lead Channel Pacing Threshold Pulse Width: 0.4 ms
Lead Channel Pacing Threshold Pulse Width: 1 ms
Lead Channel Sensing Intrinsic Amplitude: 12 mV
Lead Channel Sensing Intrinsic Amplitude: 2.6 mV
Lead Channel Setting Pacing Amplitude: 2 V
Lead Channel Setting Pacing Amplitude: 2.5 V
Lead Channel Setting Pacing Pulse Width: 1 ms
Lead Channel Setting Sensing Sensitivity: 2 mV
Pulse Gen Model: 2272
Pulse Gen Serial Number: 9156742

## 2023-02-16 DIAGNOSIS — Z961 Presence of intraocular lens: Secondary | ICD-10-CM | POA: Diagnosis not present

## 2023-03-22 NOTE — Progress Notes (Signed)
Remote pacemaker transmission.   

## 2023-05-12 ENCOUNTER — Ambulatory Visit (INDEPENDENT_AMBULATORY_CARE_PROVIDER_SITE_OTHER): Payer: Medicare Other

## 2023-05-12 DIAGNOSIS — I441 Atrioventricular block, second degree: Secondary | ICD-10-CM

## 2023-05-12 LAB — CUP PACEART REMOTE DEVICE CHECK
Battery Remaining Longevity: 53 mo
Battery Remaining Percentage: 56 %
Battery Voltage: 2.98 V
Brady Statistic AP VP Percent: 57 %
Brady Statistic AP VS Percent: 1 %
Brady Statistic AS VP Percent: 43 %
Brady Statistic AS VS Percent: 1 %
Brady Statistic RA Percent Paced: 55 %
Brady Statistic RV Percent Paced: 99 %
Date Time Interrogation Session: 20240626050759
Implantable Lead Connection Status: 753985
Implantable Lead Connection Status: 753985
Implantable Lead Implant Date: 20200930
Implantable Lead Implant Date: 20200930
Implantable Lead Location: 753859
Implantable Lead Location: 753860
Implantable Lead Model: 3830
Implantable Lead Model: 5076
Implantable Pulse Generator Implant Date: 20200930
Lead Channel Impedance Value: 510 Ohm
Lead Channel Impedance Value: 650 Ohm
Lead Channel Pacing Threshold Amplitude: 0.5 V
Lead Channel Pacing Threshold Amplitude: 0.75 V
Lead Channel Pacing Threshold Pulse Width: 0.4 ms
Lead Channel Pacing Threshold Pulse Width: 1 ms
Lead Channel Sensing Intrinsic Amplitude: 12 mV
Lead Channel Sensing Intrinsic Amplitude: 2.6 mV
Lead Channel Setting Pacing Amplitude: 2 V
Lead Channel Setting Pacing Amplitude: 2.5 V
Lead Channel Setting Pacing Pulse Width: 1 ms
Lead Channel Setting Sensing Sensitivity: 2 mV
Pulse Gen Model: 2272
Pulse Gen Serial Number: 9156742

## 2023-06-01 NOTE — Progress Notes (Signed)
 Remote pacemaker transmission.   

## 2023-06-11 DIAGNOSIS — H1131 Conjunctival hemorrhage, right eye: Secondary | ICD-10-CM | POA: Diagnosis not present

## 2023-06-22 DIAGNOSIS — M5441 Lumbago with sciatica, right side: Secondary | ICD-10-CM | POA: Diagnosis not present

## 2023-06-22 DIAGNOSIS — G8929 Other chronic pain: Secondary | ICD-10-CM | POA: Diagnosis not present

## 2023-07-15 DIAGNOSIS — E559 Vitamin D deficiency, unspecified: Secondary | ICD-10-CM | POA: Diagnosis not present

## 2023-07-15 DIAGNOSIS — F99 Mental disorder, not otherwise specified: Secondary | ICD-10-CM | POA: Diagnosis not present

## 2023-07-15 DIAGNOSIS — R9230 Dense breasts, unspecified: Secondary | ICD-10-CM | POA: Diagnosis not present

## 2023-07-15 DIAGNOSIS — I10 Essential (primary) hypertension: Secondary | ICD-10-CM | POA: Diagnosis not present

## 2023-07-15 DIAGNOSIS — N951 Menopausal and female climacteric states: Secondary | ICD-10-CM | POA: Diagnosis not present

## 2023-07-15 DIAGNOSIS — M8589 Other specified disorders of bone density and structure, multiple sites: Secondary | ICD-10-CM | POA: Diagnosis not present

## 2023-07-15 DIAGNOSIS — E669 Obesity, unspecified: Secondary | ICD-10-CM | POA: Diagnosis not present

## 2023-07-15 DIAGNOSIS — Z79890 Hormone replacement therapy (postmenopausal): Secondary | ICD-10-CM | POA: Diagnosis not present

## 2023-07-15 DIAGNOSIS — Z1231 Encounter for screening mammogram for malignant neoplasm of breast: Secondary | ICD-10-CM | POA: Diagnosis not present

## 2023-07-15 DIAGNOSIS — E039 Hypothyroidism, unspecified: Secondary | ICD-10-CM | POA: Diagnosis not present

## 2023-07-15 DIAGNOSIS — Z0000 Encounter for general adult medical examination without abnormal findings: Secondary | ICD-10-CM | POA: Diagnosis not present

## 2023-08-09 ENCOUNTER — Ambulatory Visit: Payer: Medicare Other | Admitting: Physician Assistant

## 2023-08-11 ENCOUNTER — Ambulatory Visit (INDEPENDENT_AMBULATORY_CARE_PROVIDER_SITE_OTHER): Payer: Medicare Other

## 2023-08-11 DIAGNOSIS — I441 Atrioventricular block, second degree: Secondary | ICD-10-CM | POA: Diagnosis not present

## 2023-08-11 LAB — CUP PACEART REMOTE DEVICE CHECK
Battery Remaining Longevity: 50 mo
Battery Remaining Percentage: 53 %
Battery Voltage: 2.98 V
Brady Statistic AP VP Percent: 58 %
Brady Statistic AP VS Percent: 1 %
Brady Statistic AS VP Percent: 42 %
Brady Statistic AS VS Percent: 1 %
Brady Statistic RA Percent Paced: 56 %
Brady Statistic RV Percent Paced: 99 %
Date Time Interrogation Session: 20240925020013
Implantable Lead Connection Status: 753985
Implantable Lead Connection Status: 753985
Implantable Lead Implant Date: 20200930
Implantable Lead Implant Date: 20200930
Implantable Lead Location: 753859
Implantable Lead Location: 753860
Implantable Lead Model: 3830
Implantable Lead Model: 5076
Implantable Pulse Generator Implant Date: 20200930
Lead Channel Impedance Value: 510 Ohm
Lead Channel Impedance Value: 610 Ohm
Lead Channel Pacing Threshold Amplitude: 0.5 V
Lead Channel Pacing Threshold Amplitude: 0.75 V
Lead Channel Pacing Threshold Pulse Width: 0.4 ms
Lead Channel Pacing Threshold Pulse Width: 1 ms
Lead Channel Sensing Intrinsic Amplitude: 12 mV
Lead Channel Sensing Intrinsic Amplitude: 2.6 mV
Lead Channel Setting Pacing Amplitude: 2 V
Lead Channel Setting Pacing Amplitude: 2.5 V
Lead Channel Setting Pacing Pulse Width: 1 ms
Lead Channel Setting Sensing Sensitivity: 2 mV
Pulse Gen Model: 2272
Pulse Gen Serial Number: 9156742

## 2023-08-20 DIAGNOSIS — Z23 Encounter for immunization: Secondary | ICD-10-CM | POA: Diagnosis not present

## 2023-08-27 NOTE — Progress Notes (Signed)
Remote pacemaker transmission.   

## 2023-09-20 ENCOUNTER — Encounter: Payer: Self-pay | Admitting: Physician Assistant

## 2023-09-20 ENCOUNTER — Ambulatory Visit: Payer: Medicare Other | Attending: Physician Assistant | Admitting: Physician Assistant

## 2023-09-20 VITALS — BP 126/70 | HR 64 | Ht 61.0 in | Wt 170.6 lb

## 2023-09-20 DIAGNOSIS — I480 Paroxysmal atrial fibrillation: Secondary | ICD-10-CM | POA: Insufficient documentation

## 2023-09-20 DIAGNOSIS — Z950 Presence of cardiac pacemaker: Secondary | ICD-10-CM | POA: Diagnosis not present

## 2023-09-20 DIAGNOSIS — I10 Essential (primary) hypertension: Secondary | ICD-10-CM | POA: Insufficient documentation

## 2023-09-20 DIAGNOSIS — I441 Atrioventricular block, second degree: Secondary | ICD-10-CM | POA: Diagnosis not present

## 2023-09-20 NOTE — Patient Instructions (Addendum)
Medication Instructions:  NO CHANGES *If you need a refill on your cardiac medications before your next appointment, please call your pharmacy*   Lab Work: NONE If you have labs (blood work) drawn today and your tests are completely normal, you will receive your results only by: MyChart Message (if you have MyChart) OR A paper copy in the mail If you have any lab test that is abnormal or we need to change your treatment, we will call you to review the results.   Testing/Procedures: NONE   Follow-Up: At [facility], you and your health needs are our priority.  As part of our continuing mission to provide you with exceptional heart care, we have created designated Provider Care Teams.  These Care Teams include your primary Cardiologist (physician) and Advanced Practice Providers (APPs -  Physician Assistants and Nurse Practitioners) who all work together to provide you with the care you need, when you need it.  We recommend signing up for the patient portal called "MyChart".  Sign up information is provided on this After Visit Summary.  MyChart is used to connect with patients for Virtual Visits (Telemedicine).  Patients are able to view lab/test results, encounter notes, upcoming appointments, etc.  Non-urgent messages can be sent to your provider as well.   To learn more about what you can do with MyChart, go to [email or web address].    Your next appointment:   1 year(s)  Provider:   Francis Dowse PA   Other Instructions NONE

## 2023-09-22 ENCOUNTER — Other Ambulatory Visit: Payer: Self-pay | Admitting: Sports Medicine

## 2023-09-22 DIAGNOSIS — M8588 Other specified disorders of bone density and structure, other site: Secondary | ICD-10-CM

## 2023-09-22 DIAGNOSIS — M545 Low back pain, unspecified: Secondary | ICD-10-CM | POA: Diagnosis not present

## 2023-10-05 DIAGNOSIS — N951 Menopausal and female climacteric states: Secondary | ICD-10-CM | POA: Diagnosis not present

## 2023-11-11 ENCOUNTER — Ambulatory Visit (INDEPENDENT_AMBULATORY_CARE_PROVIDER_SITE_OTHER): Payer: Medicare Other

## 2023-11-11 DIAGNOSIS — I441 Atrioventricular block, second degree: Secondary | ICD-10-CM

## 2023-11-11 LAB — CUP PACEART REMOTE DEVICE CHECK
Battery Remaining Longevity: 47 mo
Battery Remaining Percentage: 50 %
Battery Voltage: 2.98 V
Brady Statistic AP VP Percent: 61 %
Brady Statistic AP VS Percent: 1 %
Brady Statistic AS VP Percent: 39 %
Brady Statistic AS VS Percent: 1 %
Brady Statistic RA Percent Paced: 59 %
Brady Statistic RV Percent Paced: 99 %
Date Time Interrogation Session: 20241226020018
Implantable Lead Connection Status: 753985
Implantable Lead Connection Status: 753985
Implantable Lead Implant Date: 20200930
Implantable Lead Implant Date: 20200930
Implantable Lead Location: 753859
Implantable Lead Location: 753860
Implantable Lead Model: 3830
Implantable Lead Model: 5076
Implantable Pulse Generator Implant Date: 20200930
Lead Channel Impedance Value: 460 Ohm
Lead Channel Impedance Value: 630 Ohm
Lead Channel Pacing Threshold Amplitude: 0.5 V
Lead Channel Pacing Threshold Amplitude: 0.75 V
Lead Channel Pacing Threshold Pulse Width: 0.4 ms
Lead Channel Pacing Threshold Pulse Width: 1 ms
Lead Channel Sensing Intrinsic Amplitude: 12 mV
Lead Channel Sensing Intrinsic Amplitude: 2 mV
Lead Channel Setting Pacing Amplitude: 2 V
Lead Channel Setting Pacing Amplitude: 2.5 V
Lead Channel Setting Pacing Pulse Width: 1 ms
Lead Channel Setting Sensing Sensitivity: 2 mV
Pulse Gen Model: 2272
Pulse Gen Serial Number: 9156742

## 2023-12-22 NOTE — Progress Notes (Signed)
 Remote pacemaker transmission.

## 2024-02-09 ENCOUNTER — Ambulatory Visit (INDEPENDENT_AMBULATORY_CARE_PROVIDER_SITE_OTHER): Payer: No Typology Code available for payment source

## 2024-02-09 DIAGNOSIS — I441 Atrioventricular block, second degree: Secondary | ICD-10-CM

## 2024-02-09 LAB — CUP PACEART REMOTE DEVICE CHECK
Battery Remaining Longevity: 44 mo
Battery Remaining Percentage: 47 %
Battery Voltage: 2.98 V
Brady Statistic AP VP Percent: 64 %
Brady Statistic AP VS Percent: 1 %
Brady Statistic AS VP Percent: 36 %
Brady Statistic AS VS Percent: 1 %
Brady Statistic RA Percent Paced: 62 %
Brady Statistic RV Percent Paced: 99 %
Date Time Interrogation Session: 20250326020408
Implantable Lead Connection Status: 753985
Implantable Lead Connection Status: 753985
Implantable Lead Implant Date: 20200930
Implantable Lead Implant Date: 20200930
Implantable Lead Location: 753859
Implantable Lead Location: 753860
Implantable Lead Model: 3830
Implantable Lead Model: 5076
Implantable Pulse Generator Implant Date: 20200930
Lead Channel Impedance Value: 460 Ohm
Lead Channel Impedance Value: 590 Ohm
Lead Channel Pacing Threshold Amplitude: 0.5 V
Lead Channel Pacing Threshold Amplitude: 0.75 V
Lead Channel Pacing Threshold Pulse Width: 0.4 ms
Lead Channel Pacing Threshold Pulse Width: 1 ms
Lead Channel Sensing Intrinsic Amplitude: 1.7 mV
Lead Channel Sensing Intrinsic Amplitude: 12 mV
Lead Channel Setting Pacing Amplitude: 2 V
Lead Channel Setting Pacing Amplitude: 2.5 V
Lead Channel Setting Pacing Pulse Width: 1 ms
Lead Channel Setting Sensing Sensitivity: 2 mV
Pulse Gen Model: 2272
Pulse Gen Serial Number: 9156742

## 2024-02-10 ENCOUNTER — Encounter: Payer: Self-pay | Admitting: Internal Medicine

## 2024-03-23 NOTE — Progress Notes (Signed)
 Remote pacemaker transmission.

## 2024-05-09 ENCOUNTER — Other Ambulatory Visit: Payer: Medicare Other

## 2024-05-10 ENCOUNTER — Ambulatory Visit (INDEPENDENT_AMBULATORY_CARE_PROVIDER_SITE_OTHER): Payer: No Typology Code available for payment source

## 2024-05-10 DIAGNOSIS — I441 Atrioventricular block, second degree: Secondary | ICD-10-CM

## 2024-05-11 LAB — CUP PACEART REMOTE DEVICE CHECK
Battery Remaining Longevity: 41 mo
Battery Remaining Percentage: 43 %
Battery Voltage: 2.98 V
Brady Statistic AP VP Percent: 63 %
Brady Statistic AP VS Percent: 1 %
Brady Statistic AS VP Percent: 37 %
Brady Statistic AS VS Percent: 1 %
Brady Statistic RA Percent Paced: 60 %
Brady Statistic RV Percent Paced: 99 %
Date Time Interrogation Session: 20250625024551
Implantable Lead Connection Status: 753985
Implantable Lead Connection Status: 753985
Implantable Lead Implant Date: 20200930
Implantable Lead Implant Date: 20200930
Implantable Lead Location: 753859
Implantable Lead Location: 753860
Implantable Lead Model: 3830
Implantable Lead Model: 5076
Implantable Pulse Generator Implant Date: 20200930
Lead Channel Impedance Value: 490 Ohm
Lead Channel Impedance Value: 630 Ohm
Lead Channel Pacing Threshold Amplitude: 0.5 V
Lead Channel Pacing Threshold Amplitude: 0.75 V
Lead Channel Pacing Threshold Pulse Width: 0.4 ms
Lead Channel Pacing Threshold Pulse Width: 1 ms
Lead Channel Sensing Intrinsic Amplitude: 12 mV
Lead Channel Sensing Intrinsic Amplitude: 2.1 mV
Lead Channel Setting Pacing Amplitude: 2 V
Lead Channel Setting Pacing Amplitude: 2.5 V
Lead Channel Setting Pacing Pulse Width: 1 ms
Lead Channel Setting Sensing Sensitivity: 2 mV
Pulse Gen Model: 2272
Pulse Gen Serial Number: 9156742

## 2024-05-14 ENCOUNTER — Ambulatory Visit: Payer: Self-pay | Admitting: Internal Medicine

## 2024-08-02 NOTE — Addendum Note (Signed)
 Addended by: VICCI SELLER A on: 08/02/2024 02:55 PM   Modules accepted: Orders

## 2024-08-02 NOTE — Progress Notes (Signed)
 Remote PPM Transmission

## 2024-08-09 ENCOUNTER — Ambulatory Visit: Payer: No Typology Code available for payment source

## 2024-08-09 DIAGNOSIS — I480 Paroxysmal atrial fibrillation: Secondary | ICD-10-CM

## 2024-08-10 LAB — CUP PACEART REMOTE DEVICE CHECK
Battery Remaining Longevity: 38 mo
Battery Remaining Percentage: 40 %
Battery Voltage: 2.96 V
Brady Statistic AP VP Percent: 64 %
Brady Statistic AP VS Percent: 1 %
Brady Statistic AS VP Percent: 36 %
Brady Statistic AS VS Percent: 1 %
Brady Statistic RA Percent Paced: 59 %
Brady Statistic RV Percent Paced: 99 %
Date Time Interrogation Session: 20250924020014
Implantable Lead Connection Status: 753985
Implantable Lead Connection Status: 753985
Implantable Lead Implant Date: 20200930
Implantable Lead Implant Date: 20200930
Implantable Lead Location: 753859
Implantable Lead Location: 753860
Implantable Lead Model: 3830
Implantable Lead Model: 5076
Implantable Pulse Generator Implant Date: 20200930
Lead Channel Impedance Value: 480 Ohm
Lead Channel Impedance Value: 640 Ohm
Lead Channel Pacing Threshold Amplitude: 0.5 V
Lead Channel Pacing Threshold Amplitude: 0.75 V
Lead Channel Pacing Threshold Pulse Width: 0.4 ms
Lead Channel Pacing Threshold Pulse Width: 1 ms
Lead Channel Sensing Intrinsic Amplitude: 12 mV
Lead Channel Sensing Intrinsic Amplitude: 4 mV
Lead Channel Setting Pacing Amplitude: 2 V
Lead Channel Setting Pacing Amplitude: 2.5 V
Lead Channel Setting Pacing Pulse Width: 1 ms
Lead Channel Setting Sensing Sensitivity: 2 mV
Pulse Gen Model: 2272
Pulse Gen Serial Number: 9156742

## 2024-08-11 NOTE — Progress Notes (Signed)
 Remote PPM Transmission

## 2024-08-13 ENCOUNTER — Ambulatory Visit: Payer: Self-pay | Admitting: Internal Medicine

## 2024-10-24 ENCOUNTER — Ambulatory Visit: Admitting: Physician Assistant

## 2024-11-06 ENCOUNTER — Ambulatory Visit: Admitting: Physician Assistant

## 2024-11-07 ENCOUNTER — Encounter: Payer: Self-pay | Admitting: Cardiology

## 2024-11-07 ENCOUNTER — Ambulatory Visit: Attending: Physician Assistant | Admitting: Cardiology

## 2024-11-07 VITALS — BP 140/82 | HR 61 | Ht 61.0 in | Wt 164.0 lb

## 2024-11-07 DIAGNOSIS — Z950 Presence of cardiac pacemaker: Secondary | ICD-10-CM | POA: Insufficient documentation

## 2024-11-07 DIAGNOSIS — I10 Essential (primary) hypertension: Secondary | ICD-10-CM | POA: Diagnosis not present

## 2024-11-07 DIAGNOSIS — I441 Atrioventricular block, second degree: Secondary | ICD-10-CM | POA: Diagnosis present

## 2024-11-07 DIAGNOSIS — I480 Paroxysmal atrial fibrillation: Secondary | ICD-10-CM | POA: Diagnosis not present

## 2024-11-07 LAB — CUP PACEART INCLINIC DEVICE CHECK
Battery Remaining Longevity: 33 mo
Battery Voltage: 2.96 V
Brady Statistic RA Percent Paced: 58 %
Brady Statistic RV Percent Paced: 99.97 %
Date Time Interrogation Session: 20251223144538
Implantable Lead Connection Status: 753985
Implantable Lead Connection Status: 753985
Implantable Lead Implant Date: 20200930
Implantable Lead Implant Date: 20200930
Implantable Lead Location: 753859
Implantable Lead Location: 753860
Implantable Lead Model: 3830
Implantable Lead Model: 5076
Implantable Pulse Generator Implant Date: 20200930
Lead Channel Impedance Value: 475 Ohm
Lead Channel Impedance Value: 625 Ohm
Lead Channel Pacing Threshold Amplitude: 0.5 V
Lead Channel Pacing Threshold Amplitude: 0.5 V
Lead Channel Pacing Threshold Amplitude: 0.75 V
Lead Channel Pacing Threshold Amplitude: 0.75 V
Lead Channel Pacing Threshold Pulse Width: 0.4 ms
Lead Channel Pacing Threshold Pulse Width: 0.4 ms
Lead Channel Pacing Threshold Pulse Width: 1 ms
Lead Channel Pacing Threshold Pulse Width: 1 ms
Lead Channel Sensing Intrinsic Amplitude: 12 mV
Lead Channel Sensing Intrinsic Amplitude: 3.3 mV
Lead Channel Setting Pacing Amplitude: 2 V
Lead Channel Setting Pacing Amplitude: 2.5 V
Lead Channel Setting Pacing Pulse Width: 1 ms
Lead Channel Setting Sensing Sensitivity: 2 mV
Pulse Gen Model: 2272
Pulse Gen Serial Number: 9156742

## 2024-11-07 NOTE — Patient Instructions (Signed)
 Medication Instructions:   Your physician recommends that you continue on your current medications as directed. Please refer to the Current Medication list given to you today.  *If you need a refill on your cardiac medications before your next appointment, please call your pharmacy*  Lab Work: NONE ORDERED  TODAY   If you have labs (blood work) drawn today and your tests are completely normal, you will receive your results only by: MyChart Message (if you have MyChart) OR A paper copy in the mail If you have any lab test that is abnormal or we need to change your treatment, we will call you to review the results.  Testing/Procedures: NONE ORDERED  TODAY    Follow-Up: At [facility] you and your health needs are our priority.  As part of our continuing mission to provide you with exceptional heart care, our providers are all part of one team.  This team includes your primary Cardiologist (physician) and Advanced Practice Providers or APPs (Physician Assistants and Nurse Practitioners) who all work together to provide you with the care you need, when you need it.  Your next appointment:   1 year(s)    Provider:   Agatha Horsfall, MD    We recommend signing up for the patient portal called "MyChart".  Sign up information is provided on this After Visit Summary.  MyChart is used to connect with patients for Virtual Visits (Telemedicine).  Patients are able to view lab/test results, encounter notes, upcoming appointments, etc.  Non-urgent messages can be sent to your provider as well.   To learn more about what you can do with MyChart, go to [email or web address].   Other Instructions

## 2024-11-07 NOTE — Progress Notes (Signed)
" °  Electrophysiology Office Note:   ID:  Wendy, Velazquez [birth year], MRN [id number]  Primary Cardiologist: None Electrophysiologist: Will Wendy Norton, MD      History of Present Illness:   Wendy Velazquez is a 83 y.o. female with h/o Mobitz II and complete heart block s/p PPM, atrial fibrillation, hypothyroidism, hypertension seen today for routine electrophysiology followup.   Since last being seen in our clinic the patient reports doing well from a cardiac perspective. She admits to feeling occasional palpitations but denies chest pain, dyspnea, PND, orthopnea, nausea, vomiting, dizziness, syncope, edema, weight gain, or early satiety. Her primary health concern is ongoing back and leg pain. States that a recent treatment meant to improve symptoms actually made things worse.   Review of systems complete and found to be negative unless listed in HPI.   EP Information / Studies Reviewed:    EKG is ordered today. Personal review as below.  EKG Interpretation Date/Time:  Tuesday November 07 2024 13:49:07 EST Ventricular Rate:  61 PR Interval:  176 QRS Duration:  138 QT Interval:  446 QTC Calculation: 448 R Axis:   257  Text Interpretation: AV dual-paced rhythm When compared with ECG of 17-Aug-2019 06:34, No significant change was found Confirmed by Wendy Velazquez [phone]) on 11/07/2024 2:02:40 PM    PPM Interrogation-  reviewed in detail today,  See PACEART report.  Arrhythmia/Device History St. Jude - PPM - Merlin   Physical Exam:   VS:  BP (!) 140/82   Pulse 61   Ht 5' 1 (1.549 m)   Wt 164 lb (74.4 kg)   SpO2 95%   BMI 30.99 kg/m    Wt Readings from Last 3 Encounters:  11/07/24 164 lb (74.4 kg)  09/20/23 170 lb 9.6 oz (77.4 kg)  12/22/22 170 lb 10.2 oz (77.4 kg)     GEN: No acute distress  NECK: No JVD; No carotid bruits CARDIAC: Regular rate and rhythm, no murmurs, rubs, gallops RESPIRATORY:  Clear to auscultation without rales, wheezing or rhonchi  ABDOMEN:  Soft, non-tender, non-distended EXTREMITIES:  No edema; No deformity   ASSESSMENT AND PLAN:    CHB s/p Abbott PPM  Normal PPM function. Ventricular dependent. See Wendy Velazquez report No changes today  Paroxysmal atrial fibrillation Secondary hypercoagulable state Patient with CHA2DS2Vasc score 4 noted to have afib burden of 2.3%. Discussed stroke risk associated with afib. Patient voices understanding of this risk and continues to decline OAC.   Hypertension BP moderately elevated today. Patient compliant with her medications, regularly checks BP at home with typical readings 120s/80s.      Disposition:   Follow up with Dr. Norton in 12 months  Signed, Velazquez Trudy, PA-C  "

## 2024-11-10 ENCOUNTER — Ambulatory Visit (INDEPENDENT_AMBULATORY_CARE_PROVIDER_SITE_OTHER): Payer: No Typology Code available for payment source

## 2024-11-10 ENCOUNTER — Ambulatory Visit: Payer: Self-pay | Admitting: Cardiology

## 2024-11-10 DIAGNOSIS — I480 Paroxysmal atrial fibrillation: Secondary | ICD-10-CM

## 2024-11-11 LAB — CUP PACEART REMOTE DEVICE CHECK
Battery Remaining Longevity: 35 mo
Battery Remaining Percentage: 37 %
Battery Voltage: 2.96 V
Brady Statistic AP VP Percent: 62 %
Brady Statistic AP VS Percent: 1 %
Brady Statistic AS VP Percent: 38 %
Brady Statistic AS VS Percent: 1 %
Brady Statistic RA Percent Paced: 58 %
Brady Statistic RV Percent Paced: 99 %
Date Time Interrogation Session: 20251226040013
Implantable Lead Connection Status: 753985
Implantable Lead Connection Status: 753985
Implantable Lead Implant Date: 20200930
Implantable Lead Implant Date: 20200930
Implantable Lead Location: 753859
Implantable Lead Location: 753860
Implantable Lead Model: 3830
Implantable Lead Model: 5076
Implantable Pulse Generator Implant Date: 20200930
Lead Channel Impedance Value: 440 Ohm
Lead Channel Impedance Value: 630 Ohm
Lead Channel Pacing Threshold Amplitude: 0.5 V
Lead Channel Pacing Threshold Amplitude: 0.75 V
Lead Channel Pacing Threshold Pulse Width: 0.4 ms
Lead Channel Pacing Threshold Pulse Width: 1 ms
Lead Channel Sensing Intrinsic Amplitude: 12 mV
Lead Channel Sensing Intrinsic Amplitude: 2.6 mV
Lead Channel Setting Pacing Amplitude: 2 V
Lead Channel Setting Pacing Amplitude: 2.5 V
Lead Channel Setting Pacing Pulse Width: 1 ms
Lead Channel Setting Sensing Sensitivity: 2 mV
Pulse Gen Model: 2272
Pulse Gen Serial Number: 9156742

## 2024-11-13 ENCOUNTER — Ambulatory Visit: Payer: Self-pay | Admitting: Cardiology

## 2024-11-13 NOTE — Progress Notes (Signed)
 Remote PPM Transmission

## 2025-02-09 ENCOUNTER — Ambulatory Visit

## 2025-05-11 ENCOUNTER — Ambulatory Visit

## 2025-08-10 ENCOUNTER — Ambulatory Visit

## 2025-11-09 ENCOUNTER — Ambulatory Visit

## 2026-02-08 ENCOUNTER — Ambulatory Visit
